# Patient Record
Sex: Female | Born: 1935 | Race: White | Hispanic: No | Smoking: Never smoker
Health system: Southern US, Community
[De-identification: ages and names within clinical notes are randomized; demographics above are authoritative.]

## PROBLEM LIST (undated history)

## (undated) DIAGNOSIS — I5022 Chronic systolic (congestive) heart failure: Secondary | ICD-10-CM

## (undated) DIAGNOSIS — I10 Essential (primary) hypertension: Secondary | ICD-10-CM

## (undated) DIAGNOSIS — I998 Other disorder of circulatory system: Secondary | ICD-10-CM

## (undated) DIAGNOSIS — G4733 Obstructive sleep apnea (adult) (pediatric): Secondary | ICD-10-CM

## (undated) DIAGNOSIS — K5790 Diverticulosis of intestine, part unspecified, without perforation or abscess without bleeding: Secondary | ICD-10-CM

## (undated) DIAGNOSIS — S42301A Unspecified fracture of shaft of humerus, right arm, initial encounter for closed fracture: Secondary | ICD-10-CM

## (undated) DIAGNOSIS — C8590 Non-Hodgkin lymphoma, unspecified, unspecified site: Secondary | ICD-10-CM

## (undated) DIAGNOSIS — I480 Paroxysmal atrial fibrillation: Secondary | ICD-10-CM

## (undated) DIAGNOSIS — J45909 Unspecified asthma, uncomplicated: Secondary | ICD-10-CM

## (undated) DIAGNOSIS — E079 Disorder of thyroid, unspecified: Secondary | ICD-10-CM

## (undated) DIAGNOSIS — S72002A Fracture of unspecified part of neck of left femur, initial encounter for closed fracture: Secondary | ICD-10-CM

## (undated) DIAGNOSIS — E039 Hypothyroidism, unspecified: Secondary | ICD-10-CM

## (undated) DIAGNOSIS — M1990 Unspecified osteoarthritis, unspecified site: Secondary | ICD-10-CM

## (undated) DIAGNOSIS — G7000 Myasthenia gravis without (acute) exacerbation: Secondary | ICD-10-CM

## (undated) HISTORY — PX: CHOLECYSTECTOMY: SHX55

## (undated) HISTORY — DX: Essential (primary) hypertension: I10

## (undated) HISTORY — PX: HERNIA REPAIR: SHX51

## (undated) HISTORY — PX: NM PET LYMPHOMA: HXRAD33

## (undated) HISTORY — PX: BACK SURGERY: SHX140

## (undated) HISTORY — DX: Disorder of thyroid, unspecified: E07.9

---

## 2004-01-31 ENCOUNTER — Ambulatory Visit: Payer: Self-pay | Admitting: Internal Medicine

## 2004-07-14 ENCOUNTER — Ambulatory Visit: Payer: Self-pay | Admitting: Internal Medicine

## 2004-07-21 ENCOUNTER — Ambulatory Visit: Payer: Self-pay | Admitting: Unknown Physician Specialty

## 2004-07-22 ENCOUNTER — Ambulatory Visit: Payer: Self-pay | Admitting: Unknown Physician Specialty

## 2004-07-30 ENCOUNTER — Ambulatory Visit: Payer: Self-pay | Admitting: Unknown Physician Specialty

## 2004-08-05 ENCOUNTER — Ambulatory Visit: Payer: Self-pay | Admitting: Internal Medicine

## 2004-08-13 ENCOUNTER — Ambulatory Visit: Payer: Self-pay | Admitting: Internal Medicine

## 2004-08-15 ENCOUNTER — Ambulatory Visit: Payer: Self-pay | Admitting: Internal Medicine

## 2004-08-25 ENCOUNTER — Ambulatory Visit: Payer: Self-pay | Admitting: Internal Medicine

## 2004-09-03 ENCOUNTER — Ambulatory Visit: Payer: Self-pay | Admitting: Otolaryngology

## 2004-09-25 ENCOUNTER — Ambulatory Visit: Payer: Self-pay | Admitting: Internal Medicine

## 2004-09-29 ENCOUNTER — Ambulatory Visit: Payer: Self-pay | Admitting: Surgery

## 2004-10-25 ENCOUNTER — Ambulatory Visit: Payer: Self-pay | Admitting: Internal Medicine

## 2004-10-30 ENCOUNTER — Ambulatory Visit: Payer: Self-pay | Admitting: Surgery

## 2004-11-25 ENCOUNTER — Ambulatory Visit: Payer: Self-pay | Admitting: Internal Medicine

## 2004-12-19 ENCOUNTER — Ambulatory Visit: Payer: Self-pay | Admitting: Internal Medicine

## 2004-12-26 ENCOUNTER — Ambulatory Visit: Payer: Self-pay | Admitting: Internal Medicine

## 2005-01-02 ENCOUNTER — Inpatient Hospital Stay: Payer: Self-pay | Admitting: Internal Medicine

## 2005-01-25 ENCOUNTER — Ambulatory Visit: Payer: Self-pay | Admitting: Internal Medicine

## 2005-01-29 ENCOUNTER — Inpatient Hospital Stay: Payer: Self-pay | Admitting: Internal Medicine

## 2005-01-30 ENCOUNTER — Ambulatory Visit: Payer: Self-pay | Admitting: Internal Medicine

## 2005-02-25 ENCOUNTER — Ambulatory Visit: Payer: Self-pay | Admitting: Internal Medicine

## 2005-03-27 ENCOUNTER — Ambulatory Visit: Payer: Self-pay | Admitting: Internal Medicine

## 2005-04-24 ENCOUNTER — Ambulatory Visit: Payer: Self-pay | Admitting: Internal Medicine

## 2005-04-27 ENCOUNTER — Ambulatory Visit: Payer: Self-pay | Admitting: Internal Medicine

## 2005-05-28 ENCOUNTER — Ambulatory Visit: Payer: Self-pay | Admitting: Internal Medicine

## 2005-06-25 ENCOUNTER — Ambulatory Visit: Payer: Self-pay | Admitting: Internal Medicine

## 2005-07-17 ENCOUNTER — Ambulatory Visit: Payer: Self-pay | Admitting: Internal Medicine

## 2005-07-26 ENCOUNTER — Ambulatory Visit: Payer: Self-pay | Admitting: Internal Medicine

## 2005-08-25 ENCOUNTER — Ambulatory Visit: Payer: Self-pay | Admitting: Internal Medicine

## 2005-09-02 ENCOUNTER — Emergency Department: Payer: Self-pay | Admitting: Emergency Medicine

## 2005-09-10 ENCOUNTER — Emergency Department: Payer: Self-pay | Admitting: Emergency Medicine

## 2005-09-11 ENCOUNTER — Encounter: Admission: RE | Admit: 2005-09-11 | Discharge: 2005-09-11 | Payer: Self-pay | Admitting: Internal Medicine

## 2005-09-30 ENCOUNTER — Ambulatory Visit: Payer: Self-pay | Admitting: Internal Medicine

## 2005-10-08 ENCOUNTER — Encounter: Admission: RE | Admit: 2005-10-08 | Discharge: 2005-10-08 | Payer: Self-pay | Admitting: Specialist

## 2005-10-22 ENCOUNTER — Encounter: Admission: RE | Admit: 2005-10-22 | Discharge: 2005-10-22 | Payer: Self-pay | Admitting: Specialist

## 2005-10-22 ENCOUNTER — Encounter (INDEPENDENT_AMBULATORY_CARE_PROVIDER_SITE_OTHER): Payer: Self-pay | Admitting: Specialist

## 2005-10-25 ENCOUNTER — Ambulatory Visit: Payer: Self-pay | Admitting: Internal Medicine

## 2005-11-06 ENCOUNTER — Ambulatory Visit: Payer: Self-pay | Admitting: Internal Medicine

## 2005-11-12 ENCOUNTER — Encounter: Admission: RE | Admit: 2005-11-12 | Discharge: 2005-11-12 | Payer: Self-pay | Admitting: Specialist

## 2005-11-25 ENCOUNTER — Ambulatory Visit: Payer: Self-pay | Admitting: Internal Medicine

## 2006-01-06 ENCOUNTER — Encounter: Admission: RE | Admit: 2006-01-06 | Discharge: 2006-01-06 | Payer: Self-pay | Admitting: Specialist

## 2006-02-05 ENCOUNTER — Ambulatory Visit: Payer: Self-pay | Admitting: Internal Medicine

## 2006-02-12 ENCOUNTER — Ambulatory Visit: Payer: Self-pay | Admitting: Internal Medicine

## 2006-02-25 ENCOUNTER — Ambulatory Visit: Payer: Self-pay | Admitting: Internal Medicine

## 2006-04-02 ENCOUNTER — Ambulatory Visit: Payer: Self-pay | Admitting: Internal Medicine

## 2006-04-12 ENCOUNTER — Encounter: Admission: RE | Admit: 2006-04-12 | Discharge: 2006-04-12 | Payer: Self-pay | Admitting: Specialist

## 2006-04-27 ENCOUNTER — Ambulatory Visit: Payer: Self-pay | Admitting: Internal Medicine

## 2006-06-01 ENCOUNTER — Ambulatory Visit: Payer: Self-pay | Admitting: Internal Medicine

## 2006-06-26 ENCOUNTER — Ambulatory Visit: Payer: Self-pay | Admitting: Internal Medicine

## 2006-07-06 ENCOUNTER — Ambulatory Visit: Payer: Self-pay | Admitting: Internal Medicine

## 2006-07-27 ENCOUNTER — Ambulatory Visit: Payer: Self-pay | Admitting: Internal Medicine

## 2006-08-17 ENCOUNTER — Ambulatory Visit: Payer: Self-pay | Admitting: Internal Medicine

## 2006-08-26 ENCOUNTER — Ambulatory Visit: Payer: Self-pay | Admitting: Internal Medicine

## 2006-09-08 ENCOUNTER — Ambulatory Visit (HOSPITAL_COMMUNITY): Admission: RE | Admit: 2006-09-08 | Discharge: 2006-09-10 | Payer: Self-pay | Admitting: Specialist

## 2006-09-26 ENCOUNTER — Ambulatory Visit: Payer: Self-pay | Admitting: Internal Medicine

## 2006-09-30 ENCOUNTER — Encounter: Payer: Self-pay | Admitting: Specialist

## 2006-10-07 ENCOUNTER — Ambulatory Visit: Payer: Self-pay | Admitting: Internal Medicine

## 2006-10-26 ENCOUNTER — Ambulatory Visit: Payer: Self-pay | Admitting: Internal Medicine

## 2006-12-14 ENCOUNTER — Ambulatory Visit: Payer: Self-pay | Admitting: Internal Medicine

## 2006-12-27 ENCOUNTER — Ambulatory Visit: Payer: Self-pay | Admitting: Internal Medicine

## 2007-01-26 ENCOUNTER — Ambulatory Visit: Payer: Self-pay | Admitting: Internal Medicine

## 2007-01-27 ENCOUNTER — Ambulatory Visit: Payer: Self-pay | Admitting: Internal Medicine

## 2007-02-04 ENCOUNTER — Encounter: Admission: RE | Admit: 2007-02-04 | Discharge: 2007-02-04 | Payer: Self-pay | Admitting: Specialist

## 2007-03-28 ENCOUNTER — Ambulatory Visit: Payer: Self-pay | Admitting: Internal Medicine

## 2007-03-29 ENCOUNTER — Ambulatory Visit: Payer: Self-pay | Admitting: Internal Medicine

## 2007-04-05 ENCOUNTER — Encounter: Admission: RE | Admit: 2007-04-05 | Discharge: 2007-04-05 | Payer: Self-pay | Admitting: Unknown Physician Specialty

## 2007-04-28 ENCOUNTER — Ambulatory Visit: Payer: Self-pay | Admitting: Internal Medicine

## 2007-05-09 ENCOUNTER — Encounter: Admission: RE | Admit: 2007-05-09 | Discharge: 2007-05-09 | Payer: Self-pay | Admitting: Specialist

## 2007-05-29 ENCOUNTER — Ambulatory Visit: Payer: Self-pay | Admitting: Internal Medicine

## 2007-05-31 ENCOUNTER — Ambulatory Visit: Payer: Self-pay | Admitting: Internal Medicine

## 2007-06-26 ENCOUNTER — Ambulatory Visit: Payer: Self-pay | Admitting: Internal Medicine

## 2007-07-21 ENCOUNTER — Ambulatory Visit: Payer: Self-pay | Admitting: Internal Medicine

## 2007-07-27 ENCOUNTER — Ambulatory Visit: Payer: Self-pay | Admitting: Internal Medicine

## 2007-08-26 ENCOUNTER — Ambulatory Visit: Payer: Self-pay | Admitting: Internal Medicine

## 2007-10-26 ENCOUNTER — Ambulatory Visit: Payer: Self-pay | Admitting: Internal Medicine

## 2007-11-26 ENCOUNTER — Ambulatory Visit: Payer: Self-pay | Admitting: Internal Medicine

## 2008-01-26 ENCOUNTER — Ambulatory Visit: Payer: Self-pay | Admitting: Internal Medicine

## 2008-02-13 ENCOUNTER — Ambulatory Visit: Payer: Self-pay | Admitting: Internal Medicine

## 2008-02-16 ENCOUNTER — Ambulatory Visit: Payer: Self-pay | Admitting: Internal Medicine

## 2008-02-26 ENCOUNTER — Ambulatory Visit: Payer: Self-pay | Admitting: Internal Medicine

## 2008-03-27 ENCOUNTER — Ambulatory Visit: Payer: Self-pay | Admitting: Internal Medicine

## 2008-04-10 ENCOUNTER — Ambulatory Visit: Payer: Self-pay | Admitting: Internal Medicine

## 2008-04-27 ENCOUNTER — Ambulatory Visit: Payer: Self-pay | Admitting: Internal Medicine

## 2008-05-28 ENCOUNTER — Ambulatory Visit: Payer: Self-pay | Admitting: Internal Medicine

## 2008-06-05 ENCOUNTER — Ambulatory Visit: Payer: Self-pay | Admitting: Internal Medicine

## 2008-06-25 ENCOUNTER — Ambulatory Visit: Payer: Self-pay | Admitting: Internal Medicine

## 2008-07-26 ENCOUNTER — Ambulatory Visit: Payer: Self-pay | Admitting: Internal Medicine

## 2008-08-25 ENCOUNTER — Ambulatory Visit: Payer: Self-pay | Admitting: Internal Medicine

## 2008-09-06 ENCOUNTER — Ambulatory Visit: Payer: Self-pay | Admitting: Internal Medicine

## 2008-09-25 ENCOUNTER — Ambulatory Visit: Payer: Self-pay | Admitting: Internal Medicine

## 2008-10-25 ENCOUNTER — Ambulatory Visit: Payer: Self-pay | Admitting: Internal Medicine

## 2008-11-02 ENCOUNTER — Ambulatory Visit: Payer: Self-pay | Admitting: Internal Medicine

## 2008-11-25 ENCOUNTER — Ambulatory Visit: Payer: Self-pay | Admitting: Internal Medicine

## 2008-12-11 ENCOUNTER — Ambulatory Visit: Payer: Self-pay | Admitting: Internal Medicine

## 2008-12-12 ENCOUNTER — Ambulatory Visit: Payer: Self-pay | Admitting: Internal Medicine

## 2008-12-26 ENCOUNTER — Ambulatory Visit: Payer: Self-pay | Admitting: Internal Medicine

## 2009-02-25 ENCOUNTER — Ambulatory Visit: Payer: Self-pay | Admitting: Internal Medicine

## 2009-03-14 ENCOUNTER — Ambulatory Visit: Payer: Self-pay | Admitting: Internal Medicine

## 2009-03-27 ENCOUNTER — Ambulatory Visit: Payer: Self-pay | Admitting: Internal Medicine

## 2009-06-25 ENCOUNTER — Ambulatory Visit: Payer: Self-pay | Admitting: Internal Medicine

## 2009-07-11 ENCOUNTER — Ambulatory Visit: Payer: Self-pay | Admitting: Internal Medicine

## 2009-07-26 ENCOUNTER — Ambulatory Visit: Payer: Self-pay | Admitting: Internal Medicine

## 2009-10-25 ENCOUNTER — Ambulatory Visit: Payer: Self-pay | Admitting: Internal Medicine

## 2009-11-11 ENCOUNTER — Ambulatory Visit: Payer: Self-pay | Admitting: Internal Medicine

## 2009-11-25 ENCOUNTER — Ambulatory Visit: Payer: Self-pay | Admitting: Internal Medicine

## 2010-03-17 ENCOUNTER — Ambulatory Visit: Payer: Self-pay | Admitting: Internal Medicine

## 2010-03-27 ENCOUNTER — Ambulatory Visit: Payer: Self-pay | Admitting: Internal Medicine

## 2010-05-18 ENCOUNTER — Encounter: Payer: Self-pay | Admitting: Specialist

## 2010-07-28 ENCOUNTER — Ambulatory Visit: Payer: Self-pay | Admitting: Internal Medicine

## 2010-08-26 ENCOUNTER — Ambulatory Visit: Payer: Self-pay | Admitting: Internal Medicine

## 2010-09-12 NOTE — Op Note (Signed)
NAMESIARA, GORDER              ACCOUNT NO.:  [id number]   MEDICAL RECORD NO.:  [id number]          PATIENT TYPE:  AMB   LOCATION:  DAY                          FACILITY:  [facility]   PHYSICIAN:  Jene Every, M.D.    DATE OF BIRTH:  05-08-1929   DATE OF PROCEDURE:  09/08/2006  DATE OF DISCHARGE:                               OPERATIVE REPORT   PREOPERATIVE DIAGNOSES:  1. Spinal stenosis, L4-5.  2. Grade 1-1/2 spondylolisthesis.   POSTOPERATIVE DIAGNOSES:  1. Spinal stenosis, L4-5.  2. Grade 1-1/2 spondylolisthesis.   PROCEDURES PERFORMED:  Lumbar decompression after attempted X-Stop  insertion at L4-5 by central decompression of L4-5, bilateral  foraminotomies, lateral recess decompression.   ANESTHESIA:  General.   ASSISTANT:  Georges Lynch. Gioffre, M.D.   BRIEF HISTORY AND INDICATION:  A 77 year old with a history of scoliosis  and spinal stenosis with disabling neurogenic claudication.  She had  relief of her symptoms when she sat and was able to ambulate 50 feet.  Had a history of a spinal compression fracture, though that was  traumatic and a bone density study was satisfactory, and therefore we  felt that she was a candidate for an X-Stop insertion due to her overall  size and the advantage of having a support in at L4-5 that limited her  extension.  She did have facet arthropathy at multiple levels, so this  was designed to reduce her leg pain.  The risks and benefits of the  procedure were discussed, including bleeding, infection, damage to  vascular structures, CSF leakage, arthrofibrosis, adjacent segment  disease, inability to insert the X-Stop, need for X-Stop removal, and  decompression in the future and/or fusion.   TECHNIQUE:  With the patient in supine position after the induction of  adequate general anesthesia and 2 g Kefzol, she was placed prone on the  Wilson frame and all bony prominences well-padded.  The lumbar region  was prepped and draped in the  usual sterile fashion.  An 18-gauge spinal  needle was utilized to localize the L4-5 interspace, confirmed with x-  ray.  She was fairly obese and x-ray was somewhat limited.  The  subcutaneous tissue was dissected.  Electrocautery was utilized to  achieve hemostasis.  The thoracolumbar fascia identified and divided on  either line of the interspinous ligament at L4-5, preserving the  interspinous ligament.  The paraspinous muscle elevated from the lamina  of L4 and L5.  Removed a portion of the hypertrophic facet.  I then  under C-arm x-ray used the first dilator of the X-Stop and this dilated  in the appropriate position anteriorly and between the fourth and fifth  spinous process.  I then inserted the graduated dilator following that  without difficulty and dilated it to what I thought was satisfactory at  10 with two fingers but could not insert it to a 12.  We let that sit  for a period of time for relaxation and then removed it.  We therefore  selected the 10 X-Stop.  I then under direct visualization gently  inserted from right to left the bulleted  X-Stop.  As we were advancing  the X-Stop from right to left, however, there was what appeared to be a  traction line through the spinous process of L5.  I then removed the X-  Stop and evaluated.  That was indeed in fact.  I felt perhaps it was  secondary to the extensive shingling of the spinous processes.  I  attempted to repair that with FiberWire; however, after reinserting the  X-Stop it was clear that this was not going to stay in place and it  would not provide her the relief of her neurogenic claudication that the  procedure was designed for.  I therefore felt that proceeding with a  decompression was our next best option.  I consulted with Dr. Darrelyn Hillock  and asked for his assistance, and he concurred with the spinal stenosis  in this procedure with the spinous process incompetent at this point in  time, that this would require the  best would be to proceed with a  decompression.  I called the family and her daughter and sister had  discussed the issue and I spoke with them, indicating that we could  either just discontinue the procedure or proceed with a decompression,  which I had discussed with them in the office that would be required if  just the X-Stop did not work in any event.  I indicated at this point  the rehab would be about the same and I felt it was appropriate to do so  given the degree and the severity of the spinal stenosis.  She gave her  consent and therefore proceeded with that.  The operating microscope had  been draped and brought into the surgical field and the wound was  copiously irrigated.  I removed the spinous process of most of L4 and L5  and the interspinous ligament.  Then I used the 2 mm Kerrison to perform  a hemilaminotomy of the caudad edge of L4 and the cephalad edge of L5  with the 2 and then the 3 mm Kerrison, undercutting the facets to the  medial border of the pedicle bilaterally.  Pledgets were not used  initially.  Extremely hypertrophic ligamentum flavum was noted and this  was removed, decompressing the thecal sac, again undercutting the facets  with the neural elements well-protected.  We extended the decompression  cephalad and caudad and had an excellent expansion of the thecal sac.  We confirmed x-ray prior to this to confirm the level.  I freely placed  a hockey stick probe in the foramen of L4 and L5 after foraminotomies,  and they were widely patent.  The disk was examined and there was no  evidence of disk rupture.  Bipolar electrocautery was utilized to  achieve hemostasis. The wound was copiously irrigated.  We used FloSeal  in the wound for hemostasis and bone was on the cancellous surfaces.  There was no evidence of CSF leakage or active bleeding.  I placed a  drain in the wound and brought it out through a lateral stab wound in the skin.  I then repaired the  dorsal lumbar fascia with #1 Vicryl  interrupted figure-of-eight sutures, subcutaneous tissue reapproximated  in layers with 2-0 Vicryl suture and the skin was reapproximated with  staples.  The drain was checked and it slid without difficulty.  A  sterile dressing applied.  She was placed supine on a hospital bed,  extubated without difficulty, and transported to the recovery room in  satisfactory condition.  The patient tolerated the procedure well with no complications.  Blood  loss 100 mL.      Jene Every, M.D.  Electronically Signed     JB/MEDQ  D:  09/08/2006  T:  09/08/2006  Job:  [id number]

## 2010-09-23 ENCOUNTER — Ambulatory Visit: Payer: Self-pay | Admitting: General Practice

## 2010-09-26 ENCOUNTER — Ambulatory Visit: Payer: Self-pay | Admitting: Internal Medicine

## 2011-03-09 ENCOUNTER — Ambulatory Visit: Payer: Self-pay | Admitting: Internal Medicine

## 2011-03-28 ENCOUNTER — Ambulatory Visit: Payer: Self-pay | Admitting: Internal Medicine

## 2011-07-08 ENCOUNTER — Ambulatory Visit: Payer: Self-pay | Admitting: Internal Medicine

## 2011-07-08 LAB — LIPID PANEL
Cholesterol: 254 mg/dL — ABNORMAL HIGH (ref 0–200)
HDL Cholesterol: 32 mg/dL — ABNORMAL LOW (ref 40–60)
Ldl Cholesterol, Calc: 174 mg/dL — ABNORMAL HIGH (ref 0–100)
Triglycerides: 242 mg/dL — ABNORMAL HIGH (ref 0–200)
VLDL Cholesterol, Calc: 48 mg/dL — ABNORMAL HIGH (ref 5–40)

## 2011-07-08 LAB — CBC CANCER CENTER
Basophil %: 0.9 %
Eosinophil %: 1.6 %
HCT: 39.6 % (ref 35.0–47.0)
Lymphocyte #: 1.3 x10 3/mm (ref 1.0–3.6)
Lymphocyte %: 17.8 %
MCV: 86 fL (ref 80–100)
Monocyte %: 6.9 %
Neutrophil #: 5.4 x10 3/mm (ref 1.4–6.5)
Platelet: 230 x10 3/mm (ref 150–440)

## 2011-07-08 LAB — CREATININE, SERUM
Creatinine: 1.29 mg/dL (ref 0.60–1.30)
EGFR (African American): 51 — ABNORMAL LOW

## 2011-07-08 LAB — URIC ACID: Uric Acid: 6.9 mg/dL — ABNORMAL HIGH (ref 2.6–6.0)

## 2011-07-08 LAB — TSH: Thyroid Stimulating Horm: 2.79 u[IU]/mL

## 2011-07-08 LAB — LACTATE DEHYDROGENASE: LDH: 177 U/L (ref 84–246)

## 2011-07-27 ENCOUNTER — Ambulatory Visit: Payer: Self-pay | Admitting: Internal Medicine

## 2012-11-29 ENCOUNTER — Ambulatory Visit: Payer: Self-pay | Admitting: Internal Medicine

## 2012-11-29 LAB — COMPREHENSIVE METABOLIC PANEL
Albumin: 2.8 g/dL — ABNORMAL LOW (ref 3.4–5.0)
Co2: 32 mmol/L (ref 21–32)
EGFR (African American): 40 — ABNORMAL LOW
EGFR (Non-African Amer.): 35 — ABNORMAL LOW
Potassium: 4.2 mmol/L (ref 3.5–5.1)
SGOT(AST): 22 U/L (ref 15–37)
SGPT (ALT): 18 U/L (ref 12–78)
Sodium: 137 mmol/L (ref 136–145)

## 2012-11-29 LAB — HEMOGLOBIN A1C: Hemoglobin A1C: 6 % (ref 4.2–6.3)

## 2012-11-29 LAB — CBC WITH DIFFERENTIAL/PLATELET
Basophil #: 0.1 10*3/uL (ref 0.0–0.1)
Eosinophil #: 0.2 10*3/uL (ref 0.0–0.7)
HGB: 13.5 g/dL (ref 12.0–16.0)
Lymphocyte #: 2.1 10*3/uL (ref 1.0–3.6)
Lymphocyte %: 18.5 %
MCH: 29.6 pg (ref 26.0–34.0)
MCHC: 33.6 g/dL (ref 32.0–36.0)
MCV: 88 fL (ref 80–100)
Neutrophil #: 7.9 10*3/uL — ABNORMAL HIGH (ref 1.4–6.5)
RDW: 14.6 % — ABNORMAL HIGH (ref 11.5–14.5)
WBC: 11.1 10*3/uL — ABNORMAL HIGH (ref 3.6–11.0)

## 2012-11-29 LAB — CREATININE, SERUM
EGFR (African American): 41 — ABNORMAL LOW
EGFR (Non-African Amer.): 35 — ABNORMAL LOW

## 2012-11-29 LAB — TSH: Thyroid Stimulating Horm: 2.38 u[IU]/mL

## 2012-12-07 ENCOUNTER — Ambulatory Visit (INDEPENDENT_AMBULATORY_CARE_PROVIDER_SITE_OTHER): Payer: PRIVATE HEALTH INSURANCE | Admitting: Neurology

## 2012-12-07 ENCOUNTER — Encounter: Payer: Self-pay | Admitting: Neurology

## 2012-12-07 VITALS — BP 125/71 | HR 69 | Ht 59.0 in | Wt 220.2 lb

## 2012-12-07 DIAGNOSIS — H532 Diplopia: Secondary | ICD-10-CM

## 2012-12-07 NOTE — Patient Instructions (Addendum)
Overall you are doing fairly well but I do want to suggest a few things today:   Remember to drink plenty of fluid, eat healthy meals and do not skip any meals. Try to eat protein with a every meal and eat a healthy snack such as fruit or nuts in between meals. Try to keep a regular sleep-wake schedule and try to exercise daily, particularly in the form of walking, 20-30 minutes a day, if you can.   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 This a weakness in one of the muscles that control your eye movements. The good news is this typically will resolve on its own.   As far as your medications are concerned, I would like to suggest you start a bay 81mg  aspirin. Take one tablet daily for stroke prevention.  I would like to see you back in 4 months, sooner if we need to. Please call us with any interim questions, concerns, problems, updates or refill requests.   Please also call us for any test results so we can go over those with you on the phone.  My clinical assistant and will answer any of your questions and relay your messages to me and also relay most of my messages to you.   Our phone number is [phone]. We also have an after hours call service for urgent matters and there is a physician on-call for urgent questions. For any emergencies you know to call 911 or go to the nearest emergency room

## 2012-12-07 NOTE — Progress Notes (Addendum)
[facility]  Provider:  Dr Hosie Poisson Referring Provider: No ref. provider found Primary Care Physician:  No primary provider on file.  CC:  Double vision  HPI:  [name] is a 79 y.o. female here as a referral from Dr. Park Breed for evaluation of diplopia.  She reports the symptoms began acutely around one week ago. Has been near continuous since then. She was driving a car when initially started, she noted double yellow line lines, they were horizontal to each other. Continues to see horizontal double images, though on occasion he feels it is vertical. She is unsure if it is worse or better with in one direction. It improves with covering of one eye. There is no eye pain. No facial weakness, no numbness no extremity weakness or sensory changes. No tinnitus or abnormal sounds in her ear. No ear pain. Has baseline decreased hearing in her right ear. Has had cataract surgery in her left eye. She saw an optometrist after the onset of the symptoms, he noted normal eye exam as such as she followup with a neurologist. She also saw her primary care physician who ordered an MRI which was reported to be normal, and instructed to followup with a neurologist. Denies any prior history of the symptoms. No history of diabetes. No thyroid disorder. She does have a history of lymphoma in the past which is followed by her oncologist.   A few weeks ago she did notice some drooping in her left old lower eyelid, and this has been continuous. She was told it was a ptosis due to weakening of the eye muscles.   Review of Systems: Out of a complete 14 system review, the patient complains of only the following symptoms, and all other reviewed systems are negative. Positive for weight loss fatigue double vision easy bruising shortness of breath hearing loss incontinence aching muscles joint pain when he notes weakness with numbness History   Social History  . Marital Status: Widowed    Spouse Name: N/A     Number of Children: 4  . 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 Smoking status: Never Smoker   . Smokeless tobacco: Never Used  . Alcohol Use: No  . Drug Use: No  . Sexual Activity: Not on file   Other Topics Concern  . Not on file   Social History Narrative   Patient lives at home with son.     No family history on file.  Past Medical History  Diagnosis Date  . Hypertension   . Thyroid disorder     Past Surgical History  Procedure Laterality Date  . Nm pet lymphoma      Current Outpatient Prescriptions  Medication Sig Dispense Refill  . atenolol (TENORMIN) 25 MG tablet Take 25 mg by mouth daily.       . benazepril-hydrochlorthiazide (LOTENSIN HCT) 20-12.5 MG per tablet Take 1 tablet by mouth daily.       . clotrimazole-betamethasone (LOTRISONE) cream as needed.       Marland Kitchen levothyroxine (SYNTHROID, LEVOTHROID) 75 MCG tablet Take 75 mcg by mouth daily.        No current facility-administered medications for this visit.    Allergies as of 12/07/2012 - Review Complete 12/07/2012  Allergen Reaction Noted  . Sulfa antibiotics  12/07/2012    Vitals: BP 125/71  Pulse 69  Ht 4\' 11"  (1.499 m)  Wt 220 lb  4 oz (99.905 kg)  BMI 44.46 kg/m2 Last Weight:  Wt Readings from Last 1 Encounters:  12/07/12 220 lb 4 oz (99.905 kg)   Last Height:   Ht Readings from Last 1 Encounters:  12/07/12 4\' 11"  (1.499 m)     Physical exam: Exam: Gen: NAD, conversant, obese Eyes: anicteric sclerae, moist conjunctivae HENT: Atraumati Neck: Trachea midline; supple,  Lungs: CTA, no wheezing, rales, rhonic                          CV: RRR, no MRG Abdomen: Soft, non-tender;  Extremities: No peripheral edema  Skin: Normal temperature, no rash,  Psych: Appropriate affect, pleasant  Neuro: MS: AA&Ox3, appropriately interactive, normal affect   Speech: fluent w/o paraphasic error  Memory: good recent and remote  recall  CN: Anisocoria with larger right pupil (reports chronic), minimally impaired lateral gaze of R eye with double vision noted with lateral right gaze, no nystagmus, ptosis of left lid noted, sensation intact to LT and vibration V1-V3 bilat, face symmetric, no weakness, hearing diminisned on the right, palate elevates symmetrically, shoulder shrug 5/5 bilat,  tongue protrudes midline, no fasiculations noted.  Motor: normal bulk and tone, decreased ROM R shoulder which patient attributes to chronic rotator cuff injury Strength: Weakness in RUE attributed to rotator cuff injury, proximal bilateral lower extremity weakness which has been a chronic issue  Coord: rapid alternating and point-to-point (FNF, HTS) movements intact.  Reflexes: symmetrical, bilat downgoing toes  Sens: LT intact in all extremities  Gait: posture, stance, stride and arm-swing normal. Tandem gait intact. Able to walk on heels and toes. Romberg absent.   Assessment:  After physical and neurologic examination, review of laboratory studies, imaging, neurophysiology testing and pre-existing records, assessment will be reviewed on the problem list.  Plan:  Treatment plan and additional workup will be reviewed under Problem List.  Ms. Hankinson is a pleasant 79 year old woman who presents for initial evaluation of binocular diplopia. This was acute onset around one week ago. It is painless and improves with covering one eye. On physical exam she was noted to have impaired lateral gaze of the right eye with worsening of double vision with lateral gaze to the right. Otherwise her physical exam was unremarkable. 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 Differential would also include thyroid eye disease though this is less likely based on lack of supporting clinical  features. Will check thyroid levels will. Based on history lymphoma would also have to consider carcinomatous meningitis though this is very unlikely based on the isolated symptoms and otherwise unremarkable physical exam.  1)Diplopia: appears secondary to isolated 6th nerve palsy -Patient and daughter counseled that this is most likely an isolated event, and which will improve slowly over time. -Discussed patient start aspirin 81 mg daily -Discussed getting lab work with patient, patient wishes to hold off and have lab work done when she follows up with her oncologist next week. Will request hemoglobin A1c, lipid panel and TSH, free T4 -No further imaging warranted at this time -Patient counseled that if it does not improve she may benefit from the use of prism eyeglasses, can followup with ophthalmologist as needed -Followup with neurology in 4 months or as needed  I have read the note, and I agree with the clinical assessment and  plan.  Lesly Dukes

## 2012-12-13 ENCOUNTER — Ambulatory Visit: Payer: Self-pay | Admitting: Internal Medicine

## 2012-12-26 ENCOUNTER — Ambulatory Visit: Payer: Self-pay | Admitting: Internal Medicine

## 2013-03-02 ENCOUNTER — Other Ambulatory Visit: Payer: Self-pay

## 2013-03-31 ENCOUNTER — Other Ambulatory Visit: Payer: Self-pay | Admitting: Neurology

## 2013-03-31 ENCOUNTER — Encounter: Payer: Self-pay | Admitting: Neurology

## 2013-03-31 ENCOUNTER — Ambulatory Visit (INDEPENDENT_AMBULATORY_CARE_PROVIDER_SITE_OTHER): Payer: PRIVATE HEALTH INSURANCE | Admitting: Neurology

## 2013-03-31 VITALS — BP 132/80 | HR 72 | Wt 219.0 lb

## 2013-03-31 DIAGNOSIS — H532 Diplopia: Secondary | ICD-10-CM

## 2013-03-31 NOTE — Progress Notes (Signed)
[facility]  Provider:  Dr Hosie Poisson Referring Provider: No ref. provider found Primary Care Physician:  No primary provider on file.  CC:  Double vision  HPI:  [name] is a 79 y.o. female here as a followup from Dr. Park Breed for evaluation of diplopia.  Returns today for followup visit. She continues have difficulty with double vision, though notes it is slowly and progressively improving. Continues to be a horizontal double vision when looking to the right. Resolves with covering one eye. No eye pain no headache no focal weakness or sensory changes. She does continue to have some ptosis of the right eye this has been continuous.  She does note fatigue throughout the day, difficulty sleeping, waking up frequently throughout the night. She does snore, unsure if any apnea events. She does have a history of sleep apnea, has been told to wear a CPAP in the past, is not currently wearing it. Unsure when she last followed up with a sleep doctor.    Initial visit 11/2102: She reports the symptoms began acutely around one week ago. Has been near continuous since then. She was driving a car when initially started, she noted double yellow line lines, they were horizontal to each other. Continues to see horizontal double images, though on occasion he feels it is vertical. She is unsure if it is worse or better with in one direction. It improves with covering of one eye. There is no eye pain. No facial weakness, no numbness no extremity weakness or sensory changes. No tinnitus or abnormal sounds in her ear. No ear pain. Has baseline decreased hearing in her right ear. Has had cataract surgery in her left eye. She saw an optometrist after the onset of the symptoms, he noted normal eye exam as such as she followup with a neurologist. She also saw her primary care physician who ordered an MRI which was reported to be normal, and instructed to followup with a neurologist. Denies any prior  history of the symptoms. No history of diabetes. No thyroid disorder. She does have a history of lymphoma in the past which is followed by her oncologist.   A few weeks ago she did notice some drooping in her left old lower eyelid, and this has been continuous. She was told it was a ptosis due to weakening of the eye muscles.   Review of Systems: Out of a complete 14 system review, the patient complains of only the following symptoms, and all other reviewed systems are negative. Positive eye redness light sensitivity double vision back pain walking difficulty daytime sleepiness  History   Social History  . Marital Status: Widowed    Spouse Name: N/A    Number of Children: 4  . 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 Smoking status: Never Smoker   . Smokeless tobacco: Never Used  . Alcohol Use: No  . Drug Use: No  . Sexual Activity: Not on file   Other Topics Concern  . Not on file   Social History Narrative   Patient lives at home with son.   Patient has four children.   Patient is retired.   Patient has a high school education.   Patient is right-handed.   Patient drinks very little caffeine.     No family history on file.  Past Medical History  Diagnosis Date  . Hypertension   . Thyroid disorder  Past Surgical History  Procedure Laterality Date  . Nm pet lymphoma      Current Outpatient Prescriptions  Medication Sig Dispense Refill  . aspirin EC 81 MG tablet Take 81 mg by mouth daily.      Marland Kitchen atenolol (TENORMIN) 25 MG tablet Take 25 mg by mouth daily.       . benazepril-hydrochlorthiazide (LOTENSIN HCT) 20-12.5 MG per tablet Take 1 tablet by mouth daily.       . clotrimazole-betamethasone (LOTRISONE) cream as needed.       Marland Kitchen levothyroxine (SYNTHROID, LEVOTHROID) 75 MCG tablet Take 75 mcg by mouth daily.        No current facility-administered medications for this visit.    Allergies as of  03/31/2013 - Review Complete 03/31/2013  Allergen Reaction Noted  . Sulfa antibiotics  12/07/2012    Vitals: BP 132/80  Pulse 72  Wt 219 lb (99.338 kg) Last Weight:  Wt Readings from Last 1 Encounters:  03/31/13 219 lb (99.338 kg)   Last Height:   Ht Readings from Last 1 Encounters:  12/07/12 4\' 11"  (1.499 m)     Physical exam: Exam: Gen: NAD, conversant, obese Eyes: anicteric sclerae, moist conjunctivae HENT: Atraumati Neck: Trachea midline; supple,  Lungs: CTA, no wheezing, rales, rhonic                          CV: RRR, no MRG Abdomen: Soft, non-tender;  Extremities: No peripheral edema  Skin: Normal temperature, no rash,  Psych: Appropriate affect, pleasant  Neuro: MS: AA&Ox3, appropriately interactive, normal affect   Speech: fluent w/o paraphasic error  Memory: good recent and remote recall  CN: PERRL, minimally impaired lateral gaze of R eye with double vision noted with lateral right gaze, no nystagmus, ptosis of right  lid noted, sensation intact to LT and vibration V1-V3 bilat, face symmetric, no weakness, hearing diminisned on the right, palate elevates symmetrically, shoulder shrug 5/5 bilat,  tongue protrudes midline, no fasiculations noted.  Motor: normal bulk and tone, decreased ROM R shoulder which patient attributes to chronic rotator cuff injury Strength: Weakness in RUE attributed to rotator cuff injury, proximal bilateral lower extremity weakness which has been a chronic issue  Coord: rapid alternating and point-to-point (FNF, HTS) movements intact.  Reflexes: symmetrical, bilat downgoing toes  Sens: LT intact in all extremities  Gait: posture, stance, stride and arm-swing normal. Tandem gait intact. Able to walk on heels and toes. Romberg absent.   Assessment:  After physical and neurologic examination, review of laboratory studies, imaging, neurophysiology testing and pre-existing records, assessment will be reviewed on the problem  list.  Plan:  Treatment plan and additional workup will be reviewed under Problem List.  1)Diplopia  Ms. Peavler is a pleasant 79 year old woman who presents for follow up evaluation of binocular diplopia. It is painless and improves with covering one eye. On physical exam she was noted to have mildly impaired lateral gaze of the right eye with worsening of double vision with lateral gaze to the right. Also note some ptosis of R eye.Otherwise her physical exam was unremarkable. 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 Differential would also include thyroid eye disease though this is less likely based on lack of supporting clinical features. Will check thyroid levels  Based on history lymphoma would also  have to consider carcinomatous meningitis though this is very unlikely based on the isolated symptoms and otherwise unremarkable physical exam. Will have patient follow up with neuro- ophthalmology for further evaluation and possible prism lenses

## 2013-03-31 NOTE — Patient Instructions (Signed)
Overall you are doing fairly well but I do want to suggest a few things today:   We will refer you to the eye doctor for evaluation for possible prism lenses to help treat your double vision  We would like to check some blood work today  I suggest you follow up with your sleep doctor for treatment of your sleep apnea.   Please also call us for any test results so we can go over those with you on the phone.  My clinical assistant and will answer any of your questions and relay your messages to me and also relay most of my messages to you.   Our phone number is [phone]. We also have an after hours call service for urgent matters and there is a physician on-call for urgent questions. For any emergencies you know to call 911 or go to the nearest emergency room

## 2013-04-04 LAB — ACETYLCHOLINE RECEPTOR, BINDING: AChR Binding Ab, Serum: 7.4 nmol/L — ABNORMAL HIGH (ref 0.00–0.24)

## 2013-04-07 ENCOUNTER — Ambulatory Visit: Payer: PRIVATE HEALTH INSURANCE | Admitting: Neurology

## 2013-04-12 ENCOUNTER — Telehealth: Payer: Self-pay | Admitting: Neurology

## 2013-04-12 ENCOUNTER — Other Ambulatory Visit: Payer: Self-pay | Admitting: Neurology

## 2013-04-12 DIAGNOSIS — G7000 Myasthenia gravis without (acute) exacerbation: Secondary | ICD-10-CM

## 2013-04-12 MED ORDER — PYRIDOSTIGMINE BROMIDE 60 MG PO TABS: 30.0000 mg | ORAL_TABLET | Freq: Three times a day (TID) | ORAL | Status: DC

## 2013-04-12 NOTE — Telephone Encounter (Signed)
RETURNING CALL  °

## 2013-04-12 NOTE — Telephone Encounter (Signed)
Daughter would like to know if you could call her with the results because it is hard to get her mother out the house. If she has to bring her she will but rather not. Please advise. Thanks

## 2013-04-12 NOTE — Telephone Encounter (Signed)
Daughter called and findings discussed. Counseled her that lab result and clinical symptoms are consistent with a diagnosis of myasthenia gravis. Will start patient on Mestinon 30mg  TID and order CT chest to rule out thymoma.

## 2013-04-12 NOTE — Telephone Encounter (Signed)
Message copied by Ardeth Sportsman on Wed Apr 12, 2013 11:42 AM ------      Message from: Ramond Marrow      Created: Mon Apr 10, 2013 11:38 AM       Please schedule a follow up with her to discuss abnormal lab results. Thanks. ------

## 2013-05-01 ENCOUNTER — Telehealth: Payer: Self-pay | Admitting: Neurology

## 2013-05-01 NOTE — Telephone Encounter (Signed)
Patient's daughter called to state that no one has contacted her about patient's CT scan that was ordered on 04/12/13. Please call patient's daughter.

## 2013-05-01 NOTE — Telephone Encounter (Signed)
Daughter is requesting CT be done at an office in [place]

## 2013-05-08 ENCOUNTER — Telehealth: Payer: Self-pay | Admitting: Neurology

## 2013-05-08 NOTE — Telephone Encounter (Signed)
Patient's daughter calling because she has not heard back about scheduling CT scan yet. Please call the patient's daughter back.

## 2013-05-25 ENCOUNTER — Telehealth: Payer: Self-pay | Admitting: Neurology

## 2013-05-25 NOTE — Telephone Encounter (Signed)
Patient is wanting to know if there is another medication that she could try as she is retaining fluid. Patient is also having trouble sleeping. Please call to advise  - [phone] daughter's cell

## 2013-05-25 NOTE — Telephone Encounter (Signed)
Called patients daughter back to discuss concerns over medication. Counseled her that Mestinon does not typically cause edema/fluid retention. States the patient wishes to try a lower dose, therefore will decrease to 1x a day. She is still awaiting the chest CT. Will follow up as needed.

## 2013-05-29 NOTE — Addendum Note (Signed)
Addended byOliver Hum on: 05/29/2013 11:36 AM   Modules accepted: Orders

## 2013-06-09 ENCOUNTER — Ambulatory Visit: Payer: Self-pay | Admitting: Neurology

## 2013-06-15 ENCOUNTER — Other Ambulatory Visit: Payer: Self-pay | Admitting: Neurology

## 2013-06-15 ENCOUNTER — Telehealth: Payer: Self-pay | Admitting: Neurology

## 2013-06-15 NOTE — Telephone Encounter (Signed)
Patient's daughter calling to say she spoke with medical records dept at [facility] and they stated they faxed [place] on Friday at 12:3o pm. If [facility] did not receive the fax, someone from [place] needs to call [place].

## 2013-06-15 NOTE — Telephone Encounter (Signed)
Please call [name] and have the CT chest results refaxed over because I do not have them. Thanks.

## 2013-06-15 NOTE — Telephone Encounter (Signed)
Patient's daughter calling to request patient's CT scan results. Please call her back.

## 2013-06-15 NOTE — Telephone Encounter (Signed)
Patient daughter is requesting patient CT scan results that the patient had. [facility] told the patient's daughter that the information was faxed on Friday at 12:30 pm. Please advise.

## 2013-06-15 NOTE — Telephone Encounter (Signed)
Called patient and spoke with patient's daughter [name] concerning patient CT scan that she had done. I explained to the daughter that Dr. Janann Colonel has not received the results yet. I advised the patient's daughter that if the patient has any other problems, questions or concerns to call the office. Patient's daughter verbalized understanding.

## 2013-06-16 NOTE — Telephone Encounter (Signed)
Called and discussed results with patient's daughter. Notified her that there was no evidence of a thymoma but it did show a new left lower lobe nodule. Counseled her to follow up with primary care doctor for further evaluation. She expressed understanding.

## 2013-06-16 NOTE — Telephone Encounter (Signed)
Called [facility] to request patient's CT results, received the results around 1 pm, once reviewed by Dr. Janann Colonel patient will be contacted.

## 2013-08-25 ENCOUNTER — Encounter: Payer: Self-pay | Admitting: Neurology

## 2013-08-25 ENCOUNTER — Ambulatory Visit (INDEPENDENT_AMBULATORY_CARE_PROVIDER_SITE_OTHER): Payer: Medicare Other | Admitting: Neurology

## 2013-08-25 VITALS — BP 118/60 | HR 59

## 2013-08-25 DIAGNOSIS — G7000 Myasthenia gravis without (acute) exacerbation: Secondary | ICD-10-CM

## 2013-08-25 MED ORDER — PYRIDOSTIGMINE BROMIDE 60 MG PO TABS: 30.0000 mg | ORAL_TABLET | Freq: Two times a day (BID) | ORAL | Status: DC

## 2013-08-25 NOTE — Progress Notes (Signed)
[facility]  Provider:  Dr Janann Colonel Referring Provider: No ref. provider found Primary Care Physician:  Lavera Guise, MD  CC:  Double vision  HPI:  [name] is a 80 y.o. female here as a followup from Dr. Chancy Milroy for evaluation of diplopia with a diagnosis of myasthenia gravis. At last visit she was started on Mestinon 30mg  TID, was unable to tolerate so she decreased to 30mg  BID. Tolerating this dose well and noted improvement/resoluation of her double vision. No acute concerns. Does note some mild ptosis as the day goes on but is not interfering with her quality of life.  Her daughter notes some difficulty with swallowing, occurs with both liquids and solids. She has been evaluated by ENT in the past for this issue and instructed to follow up with SLT. She wishes to hold off on SLT at this time.    Initial visit 11/2102: She reports the symptoms began acutely around one week ago. Has been near continuous since then. She was driving a car when initially started, she noted double yellow line lines, they were horizontal to each other. Continues to see horizontal double images, though on occasion he feels it is vertical. She is unsure if it is worse or better with in one direction. It improves with covering of one eye. There is no eye pain. No facial weakness, no numbness no extremity weakness or sensory changes. No tinnitus or abnormal sounds in her ear. No ear pain. Has baseline decreased hearing in her right ear. Has had cataract surgery in her left eye. She saw an optometrist after the onset of the symptoms, he noted normal eye exam as such as she followup with a neurologist. She also saw her primary care physician who ordered an MRI which was reported to be normal, and instructed to followup with a neurologist. Denies any prior history of the symptoms. No history of diabetes. No thyroid disorder. She does have a history of lymphoma in the past which is followed by her  oncologist.   A few weeks ago she did notice some drooping in her left old lower eyelid, and this has been continuous. She was told it was a ptosis due to weakening of the eye muscles.   Review of Systems: Out of a complete 14 system review, the patient complains of only the following symptoms, and all other reviewed systems are negative. Positive ptosis  History   Social History  . Marital Status: Widowed    Spouse Name: N/A    Number of Children: 4  . 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 Smoking status: Never Smoker   . Smokeless tobacco: Never Used  . Alcohol Use: No  . Drug Use: No  . Sexual Activity: Not on file   Other Topics Concern  . Not on file   Social History Narrative   Patient lives at home with son.   Patient has four children.   Patient is retired.   Patient has a high school education.   Patient is right-handed.   Patient drinks very little caffeine.     Family History  Problem Relation Age of Onset  . Heart Problems Mother   . Cancer Father     Past Medical History  Diagnosis Date  . Hypertension   . Thyroid disorder     Past Surgical History  Procedure Laterality Date  . Nm pet lymphoma  Current Outpatient Prescriptions  Medication Sig Dispense Refill  . aspirin EC 81 MG tablet Take 81 mg by mouth daily.      Marland Kitchen atenolol (TENORMIN) 25 MG tablet Take 25 mg by mouth daily.       . benazepril-hydrochlorthiazide (LOTENSIN HCT) 20-12.5 MG per tablet Take 1 tablet by mouth daily.       . clotrimazole-betamethasone (LOTRISONE) cream as needed.       Marland Kitchen levothyroxine (SYNTHROID, LEVOTHROID) 75 MCG tablet Take 75 mcg by mouth daily.       Marland Kitchen pyridostigmine (MESTINON) 60 MG tablet Take 0.5 tablets (30 mg total) by mouth 3 (three) times daily. Marland Kitchen5 tab 1x daily for 3 day, then .5 tab 2x daily for 3 days then 0.5mg  tid  60 tablet  3   No current facility-administered medications for  this visit.    Allergies as of 08/25/2013 - Review Complete 08/25/2013  Allergen Reaction Noted  . Sulfa antibiotics  12/07/2012    Vitals: BP 118/60  Pulse 59 Last Weight:  Wt Readings from Last 1 Encounters:  03/31/13 219 lb (99.338 kg)   Last Height:   Ht Readings from Last 1 Encounters:  12/07/12 4\' 11"  (1.499 m)     Physical exam: Exam: Gen: NAD, conversant, obese Eyes: anicteric sclerae, moist conjunctivae HENT: [place] Neck: Trachea midline; supple,  Lungs: CTA, no wheezing, rales, rhonic                          CV: RRR, no MRG Abdomen: Soft, non-tender;  Extremities: No peripheral edema  Skin: Normal temperature, no rash,  Psych: Appropriate affect, pleasant  Neuro: MS: AA&Ox3, appropriately interactive, normal affect   Speech: fluent w/o paraphasic error  Memory: good recent and remote recall  CN: PERRL, EOMI, no nystagmus, minimal ptosis of right  lid noted, sensation intact to LT and vibration V1-V3 bilat, face symmetric, no weakness, hearing diminisned on the right, palate elevates symmetrically, shoulder shrug 5/5 bilat,  tongue protrudes midline, no fasiculations noted.  Motor: normal bulk and tone, decreased ROM R shoulder which patient attributes to chronic rotator cuff injury Strength: Weakness in RUE attributed to rotator cuff injury, proximal bilateral lower extremity weakness which has been a chronic issue  Coord: rapid alternating and point-to-point (FNF, HTS) movements intact.  Reflexes: symmetrical, bilat downgoing toes  Sens: LT intact in all extremities  Gait: posture, stance, stride and arm-swing normal. Tandem gait intact. Able to walk on heels and toes. Romberg absent.   Assessment:  After physical and neurologic examination, review of laboratory studies, imaging, neurophysiology testing and pre-existing records, assessment will be reviewed on the problem list.  Plan:  Treatment plan and additional workup will be reviewed  under Problem List.  1)Myasthenia gravis  [name] is a pleasant 80 year old woman who presents for follow up evaluation of binocular diplopia related to myasthenia gravis. Currently taking mestinon 30mg  BID and tolerating it well with resolution of her diplopia. Currently stable. Notes some difficulty swallowing, wishes to hold off on SLT consult at this time. Will refer mestinon. Follow up in 12 months or earlier if needed.

## 2013-08-25 NOTE — Patient Instructions (Signed)
Overall you are doing fairly well but I do want to suggest a few things today:   Remember to drink plenty of fluid, eat healthy meals and do not skip any meals. Try to eat protein with a every meal and eat a healthy snack such as fruit or nuts in between meals. Try to keep a regular sleep-wake schedule and try to exercise daily, particularly in the form of walking, 20-30 minutes a day, if you can.   As far as your medications are concerned, I would like to suggest you continue taking Mestinon 30mg  (1/2 tablet) twice a day. A refill was sent to the pharmacy  I would like to see you back in 12 months, sooner if we need to. Please call us with any interim questions, concerns, problems, updates or refill requests.   My clinical assistant and will answer any of your questions and relay your messages to me and also relay most of my messages to you.   Our phone number is [phone]. We also have an after hours call service for urgent matters and there is a physician on-call for urgent questions. For any emergencies you know to call 911 or go to the nearest emergency room

## 2013-08-30 ENCOUNTER — Telehealth: Payer: Self-pay | Admitting: Neurology

## 2013-11-30 NOTE — Telephone Encounter (Signed)
Noted  

## 2014-03-14 ENCOUNTER — Inpatient Hospital Stay: Payer: Self-pay | Admitting: Internal Medicine

## 2014-03-14 LAB — COMPREHENSIVE METABOLIC PANEL
ALT: 24 U/L
Albumin: 2.9 g/dL — ABNORMAL LOW (ref 3.4–5.0)
Alkaline Phosphatase: 69 U/L
Anion Gap: 3 — ABNORMAL LOW (ref 7–16)
BUN: 27 mg/dL — AB (ref 7–18)
Bilirubin,Total: 0.3 mg/dL (ref 0.2–1.0)
CO2: 31 mmol/L (ref 21–32)
Calcium, Total: 9.1 mg/dL (ref 8.5–10.1)
Chloride: 106 mmol/L (ref 98–107)
Creatinine: 1.45 mg/dL — ABNORMAL HIGH (ref 0.60–1.30)
EGFR (African American): 45 — ABNORMAL LOW
EGFR (Non-African Amer.): 37 — ABNORMAL LOW
Glucose: 145 mg/dL — ABNORMAL HIGH (ref 65–99)
Osmolality: 287 (ref 275–301)
Potassium: 5.1 mmol/L (ref 3.5–5.1)
SGOT(AST): 33 U/L (ref 15–37)
Sodium: 140 mmol/L (ref 136–145)
Total Protein: 7.2 g/dL (ref 6.4–8.2)

## 2014-03-14 LAB — URINALYSIS, COMPLETE
BILIRUBIN, UR: NEGATIVE
Bacteria: NONE SEEN
Blood: NEGATIVE
Glucose,UR: NEGATIVE mg/dL (ref 0–75)
Ketone: NEGATIVE
Leukocyte Esterase: NEGATIVE
Nitrite: NEGATIVE
PH: 5 (ref 4.5–8.0)
Protein: NEGATIVE
RBC,UR: 1 /HPF (ref 0–5)
Specific Gravity: 1.021 (ref 1.003–1.030)
Squamous Epithelial: 1
WBC UR: <1 /HPF (ref 0–5)

## 2014-03-14 LAB — CBC
HCT: 42.2 % (ref 35.0–47.0)
HGB: 13.4 g/dL (ref 12.0–16.0)
MCH: 29 pg (ref 26.0–34.0)
MCHC: 31.7 g/dL — ABNORMAL LOW (ref 32.0–36.0)
MCV: 91 fL (ref 80–100)
PLATELETS: 188 10*3/uL (ref 150–440)
RBC: 4.61 10*6/uL (ref 3.80–5.20)
RDW: 14.3 % (ref 11.5–14.5)
WBC: 18.4 10*3/uL — ABNORMAL HIGH (ref 3.6–11.0)

## 2014-03-14 LAB — PROTIME-INR
INR: 1
Prothrombin Time: 12.9 secs (ref 11.5–14.7)

## 2014-03-14 LAB — APTT: ACTIVATED PTT: 30.9 s (ref 23.6–35.9)

## 2014-03-14 LAB — CK: CK, Total: 230 U/L — ABNORMAL HIGH

## 2014-03-15 LAB — CBC WITH DIFFERENTIAL/PLATELET
BASOS PCT: 0.4 %
Basophil #: 0 10*3/uL (ref 0.0–0.1)
EOS PCT: 0.3 %
Eosinophil #: 0 10*3/uL (ref 0.0–0.7)
HCT: 35.8 % (ref 35.0–47.0)
HGB: 11.6 g/dL — AB (ref 12.0–16.0)
LYMPHS PCT: 22.4 %
Lymphocyte #: 2.1 10*3/uL (ref 1.0–3.6)
MCH: 29.1 pg (ref 26.0–34.0)
MCHC: 32.5 g/dL (ref 32.0–36.0)
MCV: 90 fL (ref 80–100)
MONO ABS: 1 x10 3/mm — AB (ref 0.2–0.9)
MONOS PCT: 10.5 %
NEUTROS ABS: 6.2 10*3/uL (ref 1.4–6.5)
NEUTROS PCT: 66.4 %
Platelet: 179 10*3/uL (ref 150–440)
RBC: 3.99 10*6/uL (ref 3.80–5.20)
RDW: 14.2 % (ref 11.5–14.5)
WBC: 9.3 10*3/uL (ref 3.6–11.0)

## 2014-03-15 LAB — BASIC METABOLIC PANEL
Anion Gap: 3 — ABNORMAL LOW (ref 7–16)
BUN: 29 mg/dL — ABNORMAL HIGH (ref 7–18)
CALCIUM: 8.3 mg/dL — AB (ref 8.5–10.1)
CHLORIDE: 106 mmol/L (ref 98–107)
Co2: 31 mmol/L (ref 21–32)
Creatinine: 1.33 mg/dL — ABNORMAL HIGH (ref 0.60–1.30)
EGFR (African American): 49 — ABNORMAL LOW
EGFR (Non-African Amer.): 41 — ABNORMAL LOW
Glucose: 112 mg/dL — ABNORMAL HIGH (ref 65–99)
OSMOLALITY: 286 (ref 275–301)
POTASSIUM: 4.6 mmol/L (ref 3.5–5.1)
SODIUM: 140 mmol/L (ref 136–145)

## 2014-03-16 LAB — BASIC METABOLIC PANEL
Anion Gap: 4 — ABNORMAL LOW (ref 7–16)
BUN: 23 mg/dL — AB (ref 7–18)
CHLORIDE: 104 mmol/L (ref 98–107)
CO2: 30 mmol/L (ref 21–32)
CREATININE: 1.26 mg/dL (ref 0.60–1.30)
Calcium, Total: 8.2 mg/dL — ABNORMAL LOW (ref 8.5–10.1)
EGFR (Non-African Amer.): 43 — ABNORMAL LOW
GFR CALC AF AMER: 52 — AB
Glucose: 100 mg/dL — ABNORMAL HIGH (ref 65–99)
OSMOLALITY: 279 (ref 275–301)
Potassium: 4.7 mmol/L (ref 3.5–5.1)
Sodium: 138 mmol/L (ref 136–145)

## 2014-03-16 LAB — CBC WITH DIFFERENTIAL/PLATELET
Basophil #: 0 10*3/uL (ref 0.0–0.1)
Basophil %: 0.3 %
EOS ABS: 0.1 10*3/uL (ref 0.0–0.7)
Eosinophil %: 1.5 %
HCT: 30.5 % — AB (ref 35.0–47.0)
HGB: 9.9 g/dL — ABNORMAL LOW (ref 12.0–16.0)
LYMPHS ABS: 1.2 10*3/uL (ref 1.0–3.6)
Lymphocyte %: 13.7 %
MCH: 29.5 pg (ref 26.0–34.0)
MCHC: 32.4 g/dL (ref 32.0–36.0)
MCV: 91 fL (ref 80–100)
Monocyte #: 0.8 x10 3/mm (ref 0.2–0.9)
Monocyte %: 9.4 %
NEUTROS PCT: 75.1 %
Neutrophil #: 6.6 10*3/uL — ABNORMAL HIGH (ref 1.4–6.5)
PLATELETS: 155 10*3/uL (ref 150–440)
RBC: 3.35 10*6/uL — ABNORMAL LOW (ref 3.80–5.20)
RDW: 14.1 % (ref 11.5–14.5)
WBC: 8.8 10*3/uL (ref 3.6–11.0)

## 2014-03-18 LAB — CBC WITH DIFFERENTIAL/PLATELET
BASOS ABS: 0 10*3/uL (ref 0.0–0.1)
BASOS PCT: 0.3 %
EOS PCT: 2.1 %
Eosinophil #: 0.2 10*3/uL (ref 0.0–0.7)
HCT: 26.1 % — ABNORMAL LOW (ref 35.0–47.0)
HGB: 8.5 g/dL — ABNORMAL LOW (ref 12.0–16.0)
LYMPHS ABS: 1.1 10*3/uL (ref 1.0–3.6)
Lymphocyte %: 10.5 %
MCH: 29.4 pg (ref 26.0–34.0)
MCHC: 32.4 g/dL (ref 32.0–36.0)
MCV: 91 fL (ref 80–100)
Monocyte #: 1 x10 3/mm — ABNORMAL HIGH (ref 0.2–0.9)
Monocyte %: 10.4 %
NEUTROS PCT: 76.7 %
Neutrophil #: 7.7 10*3/uL — ABNORMAL HIGH (ref 1.4–6.5)
Platelet: 166 10*3/uL (ref 150–440)
RBC: 2.88 10*6/uL — ABNORMAL LOW (ref 3.80–5.20)
RDW: 14.5 % (ref 11.5–14.5)
WBC: 10 10*3/uL (ref 3.6–11.0)

## 2014-03-18 LAB — BASIC METABOLIC PANEL
Anion Gap: 5 — ABNORMAL LOW (ref 7–16)
BUN: 17 mg/dL (ref 7–18)
Calcium, Total: 8.5 mg/dL (ref 8.5–10.1)
Chloride: 104 mmol/L (ref 98–107)
Co2: 30 mmol/L (ref 21–32)
Creatinine: 1.19 mg/dL (ref 0.60–1.30)
GFR CALC AF AMER: 56 — AB
GFR CALC NON AF AMER: 46 — AB
Glucose: 111 mg/dL — ABNORMAL HIGH (ref 65–99)
Osmolality: 280 (ref 275–301)
Potassium: 4.3 mmol/L (ref 3.5–5.1)
Sodium: 139 mmol/L (ref 136–145)

## 2014-03-19 LAB — CBC WITH DIFFERENTIAL/PLATELET
Basophil #: 0 10*3/uL (ref 0.0–0.1)
Basophil %: 0.5 %
Eosinophil #: 0.2 10*3/uL (ref 0.0–0.7)
Eosinophil %: 2.4 %
HCT: 24.5 % — AB (ref 35.0–47.0)
HGB: 8.1 g/dL — ABNORMAL LOW (ref 12.0–16.0)
LYMPHS PCT: 15.9 %
Lymphocyte #: 1.4 10*3/uL (ref 1.0–3.6)
MCH: 29.7 pg (ref 26.0–34.0)
MCHC: 33 g/dL (ref 32.0–36.0)
MCV: 90 fL (ref 80–100)
MONO ABS: 0.9 x10 3/mm (ref 0.2–0.9)
Monocyte %: 10.3 %
Neutrophil #: 6.1 10*3/uL (ref 1.4–6.5)
Neutrophil %: 70.9 %
Platelet: 198 10*3/uL (ref 150–440)
RBC: 2.73 10*6/uL — AB (ref 3.80–5.20)
RDW: 14.4 % (ref 11.5–14.5)
WBC: 8.6 10*3/uL (ref 3.6–11.0)

## 2014-03-20 LAB — CBC WITH DIFFERENTIAL/PLATELET
BASOS PCT: 0.5 %
Basophil #: 0 10*3/uL (ref 0.0–0.1)
EOS ABS: 0.2 10*3/uL (ref 0.0–0.7)
Eosinophil %: 2.1 %
HCT: 26.9 % — AB (ref 35.0–47.0)
HGB: 8.6 g/dL — ABNORMAL LOW (ref 12.0–16.0)
LYMPHS PCT: 16.8 %
Lymphocyte #: 1.7 10*3/uL (ref 1.0–3.6)
MCH: 28.6 pg (ref 26.0–34.0)
MCHC: 31.8 g/dL — ABNORMAL LOW (ref 32.0–36.0)
MCV: 90 fL (ref 80–100)
MONO ABS: 1.1 x10 3/mm — AB (ref 0.2–0.9)
MONOS PCT: 11.3 %
NEUTROS ABS: 6.8 10*3/uL — AB (ref 1.4–6.5)
NEUTROS PCT: 69.3 %
Platelet: 288 10*3/uL (ref 150–440)
RBC: 3 10*6/uL — AB (ref 3.80–5.20)
RDW: 14.4 % (ref 11.5–14.5)
WBC: 9.9 10*3/uL (ref 3.6–11.0)

## 2014-03-21 LAB — CREATININE, SERUM
Creatinine: 1.33 mg/dL — ABNORMAL HIGH (ref 0.60–1.30)
EGFR (Non-African Amer.): 41 — ABNORMAL LOW
GFR CALC AF AMER: 49 — AB

## 2014-03-22 LAB — CBC WITH DIFFERENTIAL/PLATELET
Basophil: 1 %
EOS PCT: 3 %
HCT: 27.3 % — AB (ref 35.0–47.0)
HGB: 8.9 g/dL — AB (ref 12.0–16.0)
Lymphocytes: 9 %
MCH: 29.4 pg (ref 26.0–34.0)
MCHC: 32.6 g/dL (ref 32.0–36.0)
MCV: 90 fL (ref 80–100)
Monocytes: 10 %
Platelet: 336 10*3/uL (ref 150–440)
RBC: 3.03 10*6/uL — AB (ref 3.80–5.20)
RDW: 14.5 % (ref 11.5–14.5)
SEGMENTED NEUTROPHILS: 77 %
WBC: 12.1 10*3/uL — ABNORMAL HIGH (ref 3.6–11.0)

## 2014-03-22 LAB — BASIC METABOLIC PANEL
ANION GAP: 6 — AB (ref 7–16)
BUN: 22 mg/dL — ABNORMAL HIGH (ref 7–18)
CREATININE: 1.19 mg/dL (ref 0.60–1.30)
Calcium, Total: 9.4 mg/dL (ref 8.5–10.1)
Chloride: 97 mmol/L — ABNORMAL LOW (ref 98–107)
Co2: 32 mmol/L (ref 21–32)
EGFR (African American): 56 — ABNORMAL LOW
GFR CALC NON AF AMER: 46 — AB
Glucose: 110 mg/dL — ABNORMAL HIGH (ref 65–99)
OSMOLALITY: 274 (ref 275–301)
POTASSIUM: 4.1 mmol/L (ref 3.5–5.1)
Sodium: 135 mmol/L — ABNORMAL LOW (ref 136–145)

## 2014-03-24 LAB — URINALYSIS, COMPLETE
BILIRUBIN, UR: NEGATIVE
Bacteria: NONE SEEN
GLUCOSE, UR: NEGATIVE mg/dL (ref 0–75)
Ketone: NEGATIVE
Nitrite: NEGATIVE
Ph: 7 (ref 4.5–8.0)
Protein: NEGATIVE
SPECIFIC GRAVITY: 1.006 (ref 1.003–1.030)
Squamous Epithelial: 3
WBC UR: 115 /HPF (ref 0–5)

## 2014-03-24 LAB — CBC WITH DIFFERENTIAL/PLATELET
BASOS ABS: 0.1 10*3/uL (ref 0.0–0.1)
Basophil %: 0.5 %
Eosinophil #: 0.3 10*3/uL (ref 0.0–0.7)
Eosinophil %: 2.4 %
HCT: 28.1 % — ABNORMAL LOW (ref 35.0–47.0)
HGB: 9 g/dL — ABNORMAL LOW (ref 12.0–16.0)
LYMPHS ABS: 1.8 10*3/uL (ref 1.0–3.6)
LYMPHS PCT: 15.5 %
MCH: 29.2 pg (ref 26.0–34.0)
MCHC: 32.1 g/dL (ref 32.0–36.0)
MCV: 91 fL (ref 80–100)
MONO ABS: 0.9 x10 3/mm (ref 0.2–0.9)
Monocyte %: 8 %
Neutrophil #: 8.4 10*3/uL — ABNORMAL HIGH (ref 1.4–6.5)
Neutrophil %: 73.6 %
PLATELETS: 432 10*3/uL (ref 150–440)
RBC: 3.08 10*6/uL — ABNORMAL LOW (ref 3.80–5.20)
RDW: 14.5 % (ref 11.5–14.5)
WBC: 11.4 10*3/uL — ABNORMAL HIGH (ref 3.6–11.0)

## 2014-03-24 LAB — BASIC METABOLIC PANEL
Anion Gap: 5 — ABNORMAL LOW (ref 7–16)
BUN: 22 mg/dL — ABNORMAL HIGH (ref 7–18)
CALCIUM: 9.1 mg/dL (ref 8.5–10.1)
Chloride: 95 mmol/L — ABNORMAL LOW (ref 98–107)
Co2: 34 mmol/L — ABNORMAL HIGH (ref 21–32)
Creatinine: 1.17 mg/dL (ref 0.60–1.30)
EGFR (African American): 57 — ABNORMAL LOW
EGFR (Non-African Amer.): 47 — ABNORMAL LOW
GLUCOSE: 99 mg/dL (ref 65–99)
Osmolality: 272 (ref 275–301)
Potassium: 4.5 mmol/L (ref 3.5–5.1)
Sodium: 134 mmol/L — ABNORMAL LOW (ref 136–145)

## 2014-04-05 ENCOUNTER — Ambulatory Visit: Payer: Self-pay | Admitting: Unknown Physician Specialty

## 2014-05-17 ENCOUNTER — Observation Stay: Payer: Self-pay | Admitting: Internal Medicine

## 2014-05-17 LAB — COMPREHENSIVE METABOLIC PANEL
Albumin: 2.6 g/dL — ABNORMAL LOW (ref 3.4–5.0)
Alkaline Phosphatase: 82 U/L
Anion Gap: 7 (ref 7–16)
BILIRUBIN TOTAL: 0.3 mg/dL (ref 0.2–1.0)
BUN: 15 mg/dL (ref 7–18)
Calcium, Total: 8.7 mg/dL (ref 8.5–10.1)
Chloride: 100 mmol/L (ref 98–107)
Co2: 30 mmol/L (ref 21–32)
Creatinine: 1.13 mg/dL (ref 0.60–1.30)
EGFR (African American): 59 — ABNORMAL LOW
GFR CALC NON AF AMER: 49 — AB
Glucose: 99 mg/dL (ref 65–99)
OSMOLALITY: 275 (ref 275–301)
Potassium: 4.4 mmol/L (ref 3.5–5.1)
SGOT(AST): 39 U/L — ABNORMAL HIGH (ref 15–37)
SGPT (ALT): 24 U/L
SODIUM: 137 mmol/L (ref 136–145)
TOTAL PROTEIN: 6.5 g/dL (ref 6.4–8.2)

## 2014-05-17 LAB — URINALYSIS, COMPLETE
BACTERIA: NONE SEEN
Bilirubin,UR: NEGATIVE
Blood: NEGATIVE
Glucose,UR: NEGATIVE mg/dL (ref 0–75)
KETONE: NEGATIVE
Nitrite: NEGATIVE
Ph: 7 (ref 4.5–8.0)
Protein: NEGATIVE
Specific Gravity: 1.017 (ref 1.003–1.030)
Squamous Epithelial: 1

## 2014-05-17 LAB — CBC WITH DIFFERENTIAL/PLATELET
BASOS ABS: 0.1 10*3/uL (ref 0.0–0.1)
Basophil %: 0.8 %
Eosinophil #: 0.2 10*3/uL (ref 0.0–0.7)
Eosinophil %: 1.6 %
HCT: 36.6 % (ref 35.0–47.0)
HGB: 11.6 g/dL — ABNORMAL LOW (ref 12.0–16.0)
Lymphocyte #: 2.6 10*3/uL (ref 1.0–3.6)
Lymphocyte %: 26.3 %
MCH: 28.9 pg (ref 26.0–34.0)
MCHC: 31.7 g/dL — ABNORMAL LOW (ref 32.0–36.0)
MCV: 91 fL (ref 80–100)
MONOS PCT: 8.3 %
Monocyte #: 0.8 x10 3/mm (ref 0.2–0.9)
NEUTROS PCT: 63 %
Neutrophil #: 6.2 10*3/uL (ref 1.4–6.5)
PLATELETS: 242 10*3/uL (ref 150–440)
RBC: 4.02 10*6/uL (ref 3.80–5.20)
RDW: 15.8 % — ABNORMAL HIGH (ref 11.5–14.5)
WBC: 9.8 10*3/uL (ref 3.6–11.0)

## 2014-05-17 LAB — LIPASE, BLOOD: LIPASE: 90 U/L (ref 73–393)

## 2014-05-17 LAB — TROPONIN I: Troponin-I: 0.08 ng/mL — ABNORMAL HIGH

## 2014-05-17 LAB — CK-MB: CK-MB: 0.7 ng/mL (ref 0.5–3.6)

## 2014-05-18 DIAGNOSIS — I10 Essential (primary) hypertension: Secondary | ICD-10-CM | POA: Diagnosis not present

## 2014-05-18 DIAGNOSIS — R7989 Other specified abnormal findings of blood chemistry: Secondary | ICD-10-CM | POA: Diagnosis not present

## 2014-05-18 LAB — TROPONIN I
TROPONIN-I: 0.06 ng/mL — AB
Troponin-I: 0.06 ng/mL — ABNORMAL HIGH

## 2014-05-18 LAB — CK-MB
CK-MB: 0.8 ng/mL (ref 0.5–3.6)
CK-MB: 0.9 ng/mL (ref 0.5–3.6)

## 2014-07-06 ENCOUNTER — Inpatient Hospital Stay: Payer: Self-pay | Admitting: Internal Medicine

## 2014-08-18 NOTE — Consult Note (Signed)
Brief Consult Note: Diagnosis: Left proximal femur fracture.   Patient was seen by consultant.   Consult note dictated.   Recommend to proceed with surgery or procedure.   Orders entered.   Comments: Patient will require surgical fixaiton of the left proximal femur fracture.  Plan for surgery tomorrow if medically cleared.    CT scan ordered of right shoulder given her pain in this area and limited ROM.  Electronic Signatures: Thornton Park (MD)  (Signed [phone] 00:18)  Authored: Brief Consult Note   Last Updated: 19-Nov-15 00:18 by Thornton Park (MD)

## 2014-08-18 NOTE — Consult Note (Signed)
PATIENT NAME:  [name], [name] MR#:  [id number] DATE OF BIRTH:  [birth year]  DATE OF CONSULTATION:  03/23/2014  CONSULTING PHYSICIAN:  Harrell Gave A. Cyrstal Leitz, MD  REASON FOR CONSULTATION:  Poor appetite, mild abdominal pain and cecal dilatation.   HISTORY OF PRESENT ILLNESS:  [name] is a pleasant 81 year old female who presented with a history of hypertension, hyperlipidemia, hypothyroidism and B-cell lymphoma and myasthenia gravis who presented on 11/18 following a fall.  She was noted to have pain in her left hip and underwent internal fixation of left hip fracture with intramedullary fixation on 11/19.  Since then, she has been seen for postoperative ileus with dilated cecum.  She has been having bowel movements and passing gas and notices that she is a little more tender in the right lower quadrant and continues to have dilated ileus, no fevers, chills. No tachycardia. No hypertension, no chest pain, shortness of breath, cough, nausea, vomiting, just poor appetite. No dysuria or hematuria.   PAST MEDICAL HISTORY:  1.  Left hip fracture status post repair on 03/15/2014.  2.  Hypertension.  3.  Hypothyroidism.  4.  Hyperlipidemia.  5.  Sleep apnea.  6.  History of B-cell lymphoma.  7.  Myasthenia gravis.   HOME MEDICATIONS:  1.  Aspirin. 2.  Atenolol. 3. 4.  L-thyroxine.   5.  Benazepril.  6.  Multivitamin.   SOCIAL HISTORY:  Denies tobacco, alcohol or drug use.   FAMILY HISTORY: Diabetes and coronary artery disease.   ALLERGIES: BETADINE, SULFA, VALIUM   REVIEW OF SYSTEMS:  A 12 point review of systems obtained. Pertinent positives and negatives as above.   PHYSICAL EXAMINATION:  VITAL SIGNS: Temperature 98.7, pulse 79, blood pressure 128/74, respirations 18 and 95% on room air.  GENERAL: No acute distress, alert and oriented x 3.  HEAD: Normocephalic, atraumatic.  EYES: No scleral icterus. No conjunctivitis.  FACE: No evidence of face trauma.  Normal external nose  and ears. CHEST:  Lungs clear to auscultation.  Moving air well.  HEART: Regular rate and rhythm. No murmurs, rubs or gallops.  ABDOMEN: Soft, mildly tender, more prominent on the right side of his abdomen. No peritonitis.  EXTREMITIES: Moves all extremities well. Strength 5/5.  NEUROLOGIC: Cranial nerves II through XII grossly intact to all 4 extremities.    LABORATORY DATA:  White cell count 12.1, hemoglobin 8.9, hematocrit 27.3, creatinine 1.19, bicarbonate 32.  CT scan shows dilated cecum with no transmural necrosis or pneumatosis.   No obvious volvulus, does appear to taper gradually into distal colon without any obvious drastic change concerning for malignancy or mass.  No intraabdominal fluid.  No free air.    ASSESSMENT AND PLAN:  [name] is a pleasant 81 year old who presents with likely a postoperative ileus, would minimize narcotic use if possible, also recommend the PT and activity per orthopedic guidelines. No obvious indication for surgery at this time.  We will continue to follow.     ____________________________ Glena Norfolk Lyzbeth Genrich, MD cal:DT D: 03/23/2014 16:10:32 ET T: 03/23/2014 16:34:04 ET JOB#: [id number]  cc: Harrell Gave A. Jacy Howat, MD, <Dictator> Floyde Parkins MD ELECTRONICALLY SIGNED 04/02/2014 20:09

## 2014-08-18 NOTE — Consult Note (Signed)
PATIENT NAME:  [name], ROTUNDO MR#:  [id number] DATE OF BIRTH:  05-13-1929  DATE OF CONSULTATION:  03/19/2014  REFERRING PHYSICIAN:   CONSULTING PHYSICIAN:  Manya Silvas, MD  HISTORY OF PRESENT ILLNESS: The patient is an 81 year old white female who had hip replacement 5 days ago and has problems with postoperative distention and ileus. I was asked to see her in consultation.   The patient says that she began passing a little gas last night and has passed the a lot more gas today. She had an x-ray previously that showed 13.5 cm cecum; today it is 12 cm. This correlates with her passing gas better. She has been on appropriate pain medication for her postoperative problems which certainly has made it difficult for her bowels to resume usual function, although the pain medicine certainly is appropriate and needed for this condition.   PAST MEDICAL HISTORY: Includes hypertension, hyperlipidemia, hypothyroidism, B-cell lymphoma, myasthenia gravis. Unspecified obstructive sleep apnea, noncompliant with BiPAP or CPAP.   REVIEW OF SYSTEMS: No shortness of breath. No chest pains. No severe abdominal pain. No vomiting.   ALLERGIES: BETADINE, SULFA, AND VALIUM.   HOME MEDICATIONS: Aspirin 81 mg a day, atenolol 25 mg a day, pyridostigmine 30 mg 3 times a day. She self-discontinued 2 weeks ago. Levothyroxine 75 mcg a day, benazepril 40 mg daily, and multivitamins 1 a day.   PHYSICAL EXAMINATION: GENERAL: Obese white female, pleasant, in no acute distress. The patient is seen with 2 family members.  VITAL SIGNS: Temperature 98.6, pulse 90, respirations 24, blood pressure 96/63, oxygen saturation 93% on room air.  HEENT: Sclerae anicteric. Conjunctivae slightly pale.  HEAD: Atraumatic. Trachea is in the midline.  CHEST: Clear in the anterior fields.  HEART: No murmurs or gallops I can hear.  ABDOMEN: Shows old gallbladder scar. The abdomen is distended. No significant tenderness. Scattered bowel  sounds are present.   IMAGING DATA: Abdominal film shows a 12 cm cecum with some evidence of an ileus throughout most of the colon.   ASSESSMENT: Postoperative ileus in a patient with the usual amount of pain medicine for previous hip surgery. I am a little concerned about her disruption of her therapy for myasthenia gravis, whether or not this could have an effect on her bowel function and I plan to consult neurology in the morning. There is a little improvement from her last x-ray.   RECOMMENDATIONS: I would give her a Fleet enema and put her on Dulcolax suppository b.i.d. to try to get her bowels moving somewhat. Will ask neurology about the pyridostigmine in this clinical situation. I will follow with you.    ____________________________ Manya Silvas, MD rte:dw D: 03/19/2014 19:24:10 ET T: 03/19/2014 20:21:04 ET JOB#: [id number]  cc: Manya Silvas, MD, <Dictator> Timoteo Gaul, MD Manya Silvas MD ELECTRONICALLY SIGNED 03/21/2014 8:18

## 2014-08-18 NOTE — Discharge Summary (Signed)
PATIENT NAME:  [name], [name] MR#:  [id number] DATE OF BIRTH:  [birth year]  DATE OF ADMISSION:  03/14/2014 DATE OF DISCHARGE:  03/19/2014   STAT DISCHARGE SUMMARY   ADMITTING PHYSICIAN: Aaron Mose. Hower, MD  DISCHARGING PHYSICIAN: Gladstone Lighter, MD  PRIMARY CARE PHYSICIAN: Lavera Guise, MD  [place]: Orthopedic consultation by Timoteo Gaul, MD.   DISCHARGE DIAGNOSES:  1.  Fall and left hip fracture, status post operative reduction and internal fixation on 03/15/2014.  2.  Hypertension.  3.  Hyperlipidemia.  4.  Sleep apnea.  5.  Myasthenia gravis, which is a relatively new diagnosis.  6.  Postoperative anemia, has not required blood transfusion so far.  7.  Acute renal failure, which is resolved.   DISCHARGE HOME MEDICATIONS:  1.  Vitamin D with minerals tablet, 1 tablet p.o. daily.  2.  Atenolol 25 mg p.o. daily.  3.  Levothyroxine 75 mcg p.o. daily.  4.  Aspirin 81 mg p.o. daily.  5.  Norco 5/325 mg 1 tablet q. 4 hours p.r.n. for pain.  6.  Flomax 0.4 mg p.o. daily.  7.  Albuterol inhaler 2 puffs q. 6 hours p.r.n. for wheezing.  8.  Nystatin topical powder under the abdominal folds 3 times a day.  9.  Calcium with Vitamin D 500 mg/200 international units 1 tablet orally twice a day.  10.  Colace 100 mg p.o. b.i.d. p.r.n. for stool softener.  11.  Ferrous sulfate 325 mg p.o. b.i.d. with meals.  12.  Lovenox 40 mg subcutaneous once a day for 24 days.  13.  Tylenol 650 mg q. 4 hours p.r.n. for pain or fever.   DISCHARGE DIET: Low-sodium diet.   DISCHARGE ACTIVITY: As tolerated.   FOLLOWUP INSTRUCTIONS:  1.  PCP followup in 2-3 weeks.  2.  Physical therapy.  3.  Hemoglobin check at rehab in 2-3 days.  4.  Ortho followup in 1 week with Dr. Mack Guise.  5.  Follow up with neurologist as per schedule for myasthenia gravis.   ORTHOPEDIC FOLLOWUP INSTRUCTION: Orthopedic instructions for a hip fracture. Please look at the instructions filled by  Dr. Mack Guise about weightbearing and followup.   LABORATORY DATA AND IMAGING STUDIES PRIOR TO DISCHARGE:  WBC 8.6, hemoglobin 8.1, hematocrit 24.2, platelet count 198,000.  Sodium 139, potassium 4.3, chloride 104, bicarbonate 30, BUN 17, creatinine 1.19, glucose 111 and calcium of 8.5.  Left femoral x-ray after surgery showing cortical irregularity; postoperative reduction and fixation noted. CT right shoulder without contrast showing severe degenerative changes of the right shoulder; no acute fracture or dislocation noted. Old humeral fracture with healing and angulation at fracture site noted. Urinalysis was negative for any infection.   Chest x-ray on admission showing cardiomegaly, blunting of the left costophrenic angle reflecting atelectasis or small pleural effusion noted.   BRIEF HOSPITAL COURSE: Ms. Forget is an 81 year old elderly Caucasian female with past medical history significant for hypertension, hyperlipidemia, history of B-cell lymphoma, recent diagnosis of myasthenia gravis and obstructive sleep apnea, who was brought in after a fall and hip pain. She was noted to have a left hip fracture.  1.   Left hip fracture. She has a displaced proximal femoral fracture extending to the lesser trochanter noted on admission x-ray, seen by orthopedics Dr. Mack Guise. Had her surgery done on 03/15/2014. Postoperatively, the patient has not had many complications. She did have some postoperative anemia after the surgery; hemoglobin has remained stable at 8. No active bleeding. Not symptomatic  at this time, so continue to monitor in a couple of days again. Continue iron supplements at this time. Seen by orthopedics. Patient worked with physical therapy and they recommended rehab. Today is postoperative day 4. 2.  Acute renal failure on admission, resolved with IV hydration. She did have some urinary retention requiring in-and-out catheterization, and was started on Flomax with improvement.  3.   Hypertension. Her atenolol is being continued. She is also on Benazepril at home, which has been stopped since her blood pressure has not been up after the surgery.  4.  Myasthenia gravis. The patient was relatively newly diagnosed about a year ago, started Mestinon; however, stopped taking it even prior to hospitalization because of multiple affects. Family aware. She will check back with her neurologist about other options, and did not want to be on the medication at this time. No significant weakness noted. Respiratory muscle fatigue is not noted. She was advised to use the incentive spirometry.  5.  The patient was started on Lovenox for deep vein thrombosis prophylaxis for a total of 4 weeks, and will follow up with orthopedics in a couple of weeks.   Her course has been otherwise uneventful in the hospital.   DISCHARGE CONDITION: Stable.   DISCHARGE DISPOSITION: To short-term rehabilitation at [facility].   CODE STATUS: Full Code.   TIME SPENT ON DISCHARGE: 40 minutes.    ____________________________ Gladstone Lighter, MD rk:MT D: 03/19/2014 15:00:00 ET T: 03/19/2014 15:33:18 ET JOB#: [id number]  cc: Gladstone Lighter, MD, <Dictator> Lavera Guise, MD Timoteo Gaul, MD Gladstone Lighter MD ELECTRONICALLY SIGNED 04/13/2014 14:08

## 2014-08-18 NOTE — Consult Note (Signed)
Chief Complaint:  Subjective/Chief Complaint seen for post operative ileus.  was doing better until this am.  mild increase of abdominal discomfort.  abd films indicatingf enlarging cecum/ileus.   minimal nausea no emesis, some increased discomfort in the llq.   VITAL SIGNS/ANCILLARY NOTES: **Vital Signs.:   27-Nov-15 04:55  Vital Signs Type Routine  Temperature Temperature (F) 97.6  Celsius 36.4  Temperature Source oral  Pulse Pulse 80  Respirations Respirations 18  Systolic BP Systolic BP 828  Diastolic BP (mmHg) Diastolic BP (mmHg) 50  Mean BP 67  Pulse Ox % Pulse Ox % 100  Pulse Ox Activity Level  At rest  Oxygen Delivery Room Air/ 21 %  *Intake and Output.:   27-Nov-15 10:58  Stool  Large loose   Brief Assessment:  GEN obese   Cardiac Regular   Respiratory clear BS   Gastrointestinal details normal mild distension, positive bowel sounds, no rushes or high pitch sounds.  positive tender to palpation in the lower abdomen, esp middle to left abdomen.   Lab Results: Routine Chem:  26-Nov-15 05:30   Glucose, Serum  110  BUN  22  Creatinine (comp) 1.19  Sodium, Serum  135  Potassium, Serum 4.1  Chloride, Serum  97  CO2, Serum 32  Calcium (Total), Serum 9.4  Anion Gap  6  Osmolality (calc) 274  eGFR (African American)  56  eGFR (Non-African American)  46 (eGFR values <27mL/min/1.73 m2 may be an indication of chronic kidney disease (CKD). Calculated eGFR, using the MRDR Study equation, is useful in  patients with stable renal function. The eGFR calculation will not be reliable in acutely ill patients when serum creatinine is changing rapidly. It is not useful in patients on dialysis. The eGFR calculation may not be applicable to patients at the low and high extremes of body sizes, pregnant women, and vegetarians.)  Routine Hem:  18-Nov-15 21:17   Hemoglobin (CBC) 13.4  19-Nov-15 05:52   Hemoglobin (CBC)  11.6  20-Nov-15 06:51   Hemoglobin (CBC)  9.9   22-Nov-15 03:36   Hemoglobin (CBC)  8.5  23-Nov-15 04:53   Hemoglobin (CBC)  8.1  24-Nov-15 08:14   Hemoglobin (CBC)  8.6  26-Nov-15 05:30   WBC (CBC)  12.1  RBC (CBC)  3.03  Hemoglobin (CBC)  8.9  Hematocrit (CBC)  27.3  Platelet Count (CBC) 336 (Result(s) reported on 22 Mar 2014 at 07:23AM.)  MCV 90  MCH 29.4  MCHC 32.6  RDW 14.5  Segmented Neutrophils 77  Lymphocytes 9  Monocytes 10  Eosinophil 3  Basophil 1  Diff Comment 1 ANISOCYTOSIS  Diff Comment 2 POLYCHROMASIA  Diff Comment 3 PLTS VARIED IN SIZE  Result(s) reported on 22 Mar 2014 at 07:23AM.   Radiology Results: XRay:    26-Nov-15 09:38, KUB - Kidney Ureter Bladder  KUB - Kidney Ureter Bladder   REASON FOR EXAM:    f/u cecal diltation and ileus  COMMENTS:       PROCEDURE: DXR - DXR KIDNEY URETER BLADDER  - Mar 22 2014  9:38AM     CLINICAL DATA:  Cecal dilatation and ileus.    EXAM:  ABDOMEN - 1 VIEW    COMPARISON:  Plain films of the abdomen11/24/2015 and 03/21/2014.    FINDINGS:  There is persistent gaseous distention of the colon and dilatation  of the cecum. The cecum is estimated 12.6 cm in diameter, slightly  increased from the 11/20 07/2013 study.     IMPRESSION:  Persisting gaseous  distention of the colon, worst in the cecum,  compatible with ileus.      Electronically Signed    By: Inge Rise M.D.    On: 03/22/2014 10:23         Verified By: Ramond Dial, M.D.,    [phone] 08:29, KUB - Kidney Ureter Bladder  KUB - Kidney Ureter Bladder   REASON FOR EXAM:    f/u cecal dilatation and ileus  COMMENTS:       PROCEDURE: DXR - DXR KIDNEY URETER BLADDER  - Mar 23 2014  8:29AM     CLINICAL DATA:  Followup ileus. Postop fixation of left hip  fracture.    EXAM:  ABDOMEN - 1 VIEW    COMPARISON: 03/22/2014    FINDINGS:  Air is present throughout the colon with distention of the cecum  measuring 15 cm in diameter slightly worse. There are a few  air-filled small bowel  loops in the left mid to lower abdomen with  mild dilatation. No evidence of free peritoneal air. Remainder of  the exam is unchanged.     IMPRESSION:  Air-filled large and small bowel with slight worsening of gaseous  distention of the cecum measuring 15 cm in diameter. Several  air-filled minimally dilated small bowel loops in the left mid to  lower abdomen. Pattern most suggestive of ongoing ileus.      Electronically Signed    By: Marin Olp M.D.    On: 03/23/2014 08:42     Verified By: Pearletha Alfred, M.D.,   Assessment/Plan:  Assessment/Plan:  Assessment 1) post-operative ileus. worsening xrays since yesterday.  patietn states that after the films were done this am she had a large bm and passed alot of gas.  Currently awaiting CT abd and pelvis.  if there is continued evidence of enlarging cecal distension, would consult surgery.  Mobilization this am may have been of some benefits.   Plan following-Dr Rayann Heman to see tomorrow. am films depending on clinical situation.   Electronic Signatures: Loistine Simas (MD)  (Signed [phone] 11:57)  Authored: Chief Complaint, VITAL SIGNS/ANCILLARY NOTES, Brief Assessment, Lab Results, Radiology Results, Assessment/Plan   Last Updated: 27-Nov-15 11:57 by Loistine Simas (MD)

## 2014-08-18 NOTE — Consult Note (Signed)
Pt had good bowel movements this am.  She is not sure if it was before or after KUB.  Exam today is definitely better than yesterday with less bloating, abd less tight, bowel sounds are better.  Likely can go tomorrow unless any setback occurs.  Electronic Signatures: Manya Silvas (MD)  (Signed on 26-Nov-15 14:34)  Authored  Last Updated: 26-Nov-15 14:34 by Manya Silvas (MD)

## 2014-08-18 NOTE — Consult Note (Signed)
Pt with distended abd, decreased bowel sounds compared to yesterday.  Nurses report 3 stools.  Continue meds, follow x-ray, check lytes tomorrow.  Coffee may help some and will have nurse get her some (decaf)  Electronic Signatures: Manya Silvas (MD)  (Signed on 25-Nov-15 15:24)  Authored  Last Updated: 25-Nov-15 15:24 by Manya Silvas (MD)

## 2014-08-18 NOTE — Discharge Summary (Signed)
PATIENT NAME:  [name], [name] MR#:  [id number] DATE OF BIRTH:  April 01, 1930  DATE OF ADMISSION:  03/14/2014 DATE OF DISCHARGE:  03/22/2014   ADDENDUM:  DISCHARGING PHYSICIAN: Gladstone Lighter, MD.  PRIMARY CARE PHYSICIAN: Lavera Guise, MD.  [place]:  1.  Orthopedic consultation by Timoteo Gaul, MD.  2.  GI consultation by Manya Silvas, MD.   For more details, please look at the discharge summary dictated by Dr. Tressia Miners on 03/19/2014. The patient was scheduled to be discharged to [facility] after her left hip fracture repair on 03/19/2014; however, she has developed postoperative ileus on that day. She had serial KUBs; at which time, her cecal dilatation diameter was greater than 12 cm. She was started on suppository, seen by GI. She has been having bowel movements. Her last cecal dilatation from KUB this morning shows about 10 cm with significant improvement, and she has good bowel sounds and able to eat and work with physical therapy better. Only changes to her medications are her Colace is now scheduled 100 mg p.o. b.i.d. She has Dulcolax by mouth 10 mg tablet daily for constipation and orders to hold it if greater than 2 bowel movements per day and MiraLax powder daily p.r.n. for constipation have been added. She has a KUB followup first thing in the morning tomorrow. If that shows improvement, she should be able to be discharged to [facility]. Her course has been otherwise uneventful.   Additional time spent 35 minutes.    ____________________________ Gladstone Lighter, MD [phone] D: 03/21/2014 15:12:56 ET T: 03/21/2014 15:28:08 ET JOB#: [id number]  cc: Gladstone Lighter, MD, <Dictator> Timoteo Gaul, MD Lavera Guise, MD  Gladstone Lighter MD ELECTRONICALLY SIGNED 04/13/2014 14:08

## 2014-08-18 NOTE — Consult Note (Signed)
Pt abd a little less tight today, bowel sounds are a little more numerous.  Pt passed signif amt of gas today.  Getting dulcolax supp bid, lactulose started.  did get out of bed and was on portable toilet.  It has not been a week since surgery.  She is starting to turn the corner on this problem but will need continual care for a few more days. Her lungs sound better today.    Electronic Signatures: Manya Silvas (MD)  (Signed on 24-Nov-15 17:36)  Authored  Last Updated: 24-Nov-15 17:36 by Manya Silvas (MD)

## 2014-08-18 NOTE — Discharge Summary (Signed)
PATIENT NAME:  [name], [name] MR#:  [id number] DATE OF BIRTH:  09-21-1929  DATE OF ADMISSION:  03/14/2014 DATE OF DISCHARGE:  03/24/2014  ADDENDUM:  ADMITTING PHYSICIAN: Aaron Mose. Hower, MD.   DISCHARGING PHYSICIAN: Gladstone Lighter, MD.    PRIMARY CARE PHYSICIAN: Lavera Guise, MD.   [place]:  1.  Orthopedic consultation by Dr. Thornton Park.  2.  GI consultation by Dr. Gaylyn Cheers.   HOSPITAL COURSE: For more details, look at the discharge summary dictated by Dr. Tressia Miners on 03/19/2014 and also dictated on 03/22/2014. The patient had hip surgery done on 03/15/2014 by Dr. Mack Guise for fall and left hip fracture. Postoperatively her course has been complicated by severe ileus and cecal dilatation as noted on KUBs. The patient's KUBs were consistently showing her ileus was dilated at 10 to 12 cm and as high as 15 cm. Clinically she was significantly improving. She was having constant bowel movements. No stool noted. No bowel obstruction noted. A CT of her abdomen and pelvis done with oral contrast and it showed the cecum is only mildly dilated. There is no abnormal wall thickening or inflammation noted. In speaking to the radiologist and also GI doctor, the patient might have marginal dilatation of cecum at baseline and with gaseous distention it is worse, but clinically since she is improving she will need to get physical therapy, so is okay for discharge to rehab. KUB followup in a couple of days and if any abdominal symptoms come up and if she constipated again, then we will need to bring her to the Emergency Room again.   Also of note, she was having some dysuria. UA done shows 3+ leukocyte esterase and multiple WBCs and no bacteria, but because she is symptomatic will start her on p.o. Levaquin.   ADDITIONAL TIME SPENT: 35 minutes.     ____________________________ Gladstone Lighter, MD rk:at D: 03/24/2014 12:07:32 ET T: 03/24/2014 12:17:39  ET JOB#: [id number]  cc: Gladstone Lighter, MD, <Dictator> Lavera Guise, MD Gladstone Lighter MD ELECTRONICALLY SIGNED 04/13/2014 14:06

## 2014-08-18 NOTE — Op Note (Signed)
PATIENT NAME:  Christy, Rodriguez MR#:  [id number] DATE OF BIRTH:  06-Sep-1929  DATE OF PROCEDURE:  03/15/2014  PREOPERATIVE DIAGNOSIS:  Comminuted left subtrochanteric fracture.  POSTOPERATIVE DIAGNOSIS:  Comminuted left subtrochanteric fracture.   PROCEDURE: Intramedullary fixation of left comminuted subtrochanteric hip fracture with intramedullary fixation.   SURGEON: Thornton Park, MD.  ANESTHESIA: Spinal.   COMPLICATIONS: None.   IMPLANTS: Biomet AFFIXUS long intramedullary rod.   INDICATION FOR THE PROCEDURE: [name] is an 81 year old female who sustained a mechanical fall. She was diagnosed with a comminuted subtrochanteric fracture by x-ray in the ER. I had recommended intramedullary fixation for this fracture. The patient was cleared by the hospitalist service for surgery. I reviewed all labs and radiographic studies in preparation for this surgery.   The patient's left leg was marked with the word yes according to the hospital's right site protocol. She was then brought to the operating room where she was placed supine on the operative table. All bony prominences were adequately padded. The left leg was placed in a leg holder and the right leg was placed in a hemi- lithotomy position. A timeout was performed to verify the patient's name, date of birth, medical record number, correct site of surgery and correct procedure to be performed. It was also used to verify the patient had received antibiotics. All proper instruments, implants, and radiographic studies were available in the room. Once all in attendance were in agreement, the case began. The patient had a closed reduction performed using traction and internal rotation. The fracture position was confirmed on C-arm imaging in both the AP and lateral planes. Once adequate reduction was achieved, the patient was prepped and draped in sterile fashion. The C-arm images were used for incisional planning. A lateral incision was made  just superior to the tip of the greater trochanter and into the proximal femur, traversing the fracture.  This was overdrilled with a 15 mm proximal femoral  drill. A flexible  ball-tip guidewire was then placed down the shaft of the femur. Flexible reamers were then used up to a size 11 mm.  This allowed for easy placement of a long 9 mm Biomet AFFIXUS rod. The drill guide was placed through the AFFIXUS nail guide arm for the lag screw. A small stab incision was made along the lateral femur just below the greater trochanter. This allowed for positioning of the drill guide along the lateral cortex of the femur. A threaded guide pin was advanced through the drill guide across the fracture site and into the femoral head. A tip apex distance of less than 25 mm was achieved. The position of the guidepin was confirmed on AP and lateral C-arm images. A depth gauge was then used to measure the length of the lag screw. The guide pin was then overdrilled and the lag screw position by hand. Again, a tip apex distance less than 25 mm was achieved.   The set screw on top of the nail was then tightened and then loosened a quarter turn to allow for compression of the fracture site. During placement of the rod, it was appreciated how  comminuted the fracture actually was. The long rod lag screw, however, were able to stabilize the primary fracture fragments.   Finally, a distal interlocking screw was placed using a perfect circle technique. The hole was drilled using a freehand technique.  Once the distal interlocking screw was placed, its position was confirmed on AP and lateral C-arm images. Final images of the  entire intramedullary construct was performed. The fracture was well reduced and the hardware was in good position. All incisions were then copiously irrigated. The deep fascia was closed with 0 Vicryl, the subcutaneous tissue was closed with 2-0 Vicryl and the skin was approximated with staples.  At the proximal most  incision, the middle incision was closed with 2-0 Vicryl and skin staples in the distal interlocking screw was closed with staples alone. Dry sterile dressing was applied over the left lateral femur. The patient was then transferred gently to a hospital bed and brought to the PACU in stable condition. I was scrubbed and present for the entire case, and all sharp and instrument counts were correct at the conclusion of the case. I spoke with the family in the postoperative waiting room to let them know the case had gone without complication. The patient was stable in the recovery room.   ____________________________ Timoteo Gaul, MD klk:DT D: 03/22/2014 13:07:54 ET T: 03/22/2014 13:37:06 ET JOB#: [id number]  cc: Timoteo Gaul, MD, <Dictator> Timoteo Gaul MD ELECTRONICALLY SIGNED 03/26/2014 17:48

## 2014-08-18 NOTE — Consult Note (Signed)
PATIENT NAME:  [name] MR#:  [id number] DATE OF BIRTH:  09-Apr-1930  DATE OF CONSULTATION:  03/14/2014  CONSULTING PHYSICIAN:  Timoteo Gaul, MD  REASON FOR CONSULTATION:  Left subtrochanteric femur fracture.   HISTORY OF PRESENT ILLNESS:  [name] is an 81 year old female who sustained a mechanical fall at home. She explains that she was lying on her left side for approximately 2 hours before being brought to the [facility] Emergency Department. She had immediate pain in the left hip and has been unable to stand or ambulate following the injury. She denies any numbness or tingling. She was seen in the Emergency Room surrounded by her family.   PAST MEDICAL HISTORY:  Includes essential hypertension, hypothyroidism, unspecified hyperlipidemia, obstructive sleep apnea, history of B-cell lymphoma and myasthenia gravis.   SOCIAL HISTORY:  The patient denies alcohol, tobacco, or drug use.   ALLERGIES:  BETADINE, SULFA DRUGS, AND VALIUM.   HOME MEDICATIONS:  Include 81 mg of aspirin daily; atenolol 25 mg daily; pyridostigmine 30 mg p.o. t.i.d., which she self-discontinued approximately 2 weeks ago; levothyroxine 75 mcg p.o. daily; benazepril 40 mg p.o. daily; and multivitamin daily.   PHYSICAL EXAMINATION:  The patient has intact skin overlying the left hip. She is shortened and externally rotated. She can flex and extend her toes and has intact sensation to light touch throughout the left lower extremity. Her foot is well perfused, and she has palpable pedal pulses. Her skin is intact. Her thigh compartments are soft and compressible. There is no erythema, ecchymosis, or swelling over the left hip. She is neurovascularly intact on the right lower extremity. She is complaining of significant pain in the right shoulder, but her skin is intact. There is no erythema or ecchymosis. She has anterior swelling over the chest, but there is no crepitus to palpation of the clavicle. She has  a history of a humeral shaft fracture and has chronic pain in the right shoulder, which preceded this injury. The fall has only made it worse.   RADIOLOGY:  The patient's pelvis and left hip films demonstrate a fracture extending into the subtrochanteric area of the left femur. There is dissociation of the shaft of the greater trochanter and femoral head and neck. There is obvious displacement of the fracture. The fracture appears comminuted.   X-rays of the right shoulder demonstrate no acute fracture. There is a chronic midshaft humeral fracture with abundant callus formation. She has chronic advanced degenerative changes of the glenohumeral joint.   ASSESSMENT: 1.  Left proximal femur fracture of subtrochanteric variety.  2.  Right shoulder pain without evidence of fracture by plain film.   PLAN:  I explained to [name] and her family that she will require surgery to fix her proximal femur fracture. I recommended an intramedullary rod. I reviewed the x-rays with the patient and her family. They took cell phone pictures of the images. I examined the patient's right shoulder. She had limited range of motion and mild tenderness, but her skin was intact and distally she was neurovascularly intact in her hand. I have recommended intramedullary fixation and explained the surgery and the postoperative course for this. The patient will be n.p.o. after midnight. She is being admitted to the hospitalist service for preoperative evaluation. We will plan for surgery tomorrow as long as she is medically cleared.     ____________________________ Timoteo Gaul, MD klk:nb D: 03/15/2014 00:16:39 ET T: 03/15/2014 00:34:06 ET JOB#: [id number]  cc: Timoteo Gaul,  MD, <Dictator> Timoteo Gaul MD ELECTRONICALLY SIGNED 03/20/2014 10:06

## 2014-08-18 NOTE — Consult Note (Signed)
Pt with post op ileus with dilated cecum of 12cm.  She passed gas today and had bowel movement.  Will continue Dulcolax supp bid and give one fleets enema tonight.  She may be starting to improve with the ileus and I would continue current plan.  If hgb drops below 8 I would transfuse. Will follow with you, suggest daily x-rays until see definite strong improvement.  Electronic Signatures: Manya Silvas (MD)  (Signed on 23-Nov-15 19:18)  Authored  Last Updated: 23-Nov-15 19:18 by Manya Silvas (MD)

## 2014-08-18 NOTE — H&P (Signed)
PATIENT NAME:  [name], [name] MR#:  [id number] DATE OF BIRTH:  Mar 16, 1930  DATE OF ADMISSION:  03/14/2014  REFERRING PHYSICIAN: Verdia Kuba. Paduchowski, MD  PRIMARY CARE PHYSICIAN: Clayborn Bigness, MD   CHIEF COMPLAINT: Hip pain.  HISTORY OF PRESENT ILLNESS: An 81 year old Caucasian female with past medical history of hypertension, hyperlipidemia, hypothyroidism, as well as B-cell lymphoma and myasthenia gravis who is presenting after a fall. She suffered a mechanical fall at home. She was apparently lying on the floor for about 2 hours face down in total. She is unable to move given pain. She felt acute pain over her left hip, 10/10 intensity, described only as " pain," worse with movement. No relieving factors. Brought into the Emergency Department for further workup and evaluation and found to have a displaced proximal femur fracture extending to the lesser trochanter without dislocation. Also of note, she stopped taking her pyridostigmine about 2 weeks ago and she self discontinued that. Since that time she has had no ill effects or worsening weakness, as the patient or family would describe.  REVIEW OF SYSTEMS: CONSTITUTIONAL: Denies any fevers, chills, fatigue, weakness.  EYES: Denies blurred vision, double vision or eye pain.  EARS, NOSE, THROAT: Denies tinnitus, ear pain, hearing loss.  RESPIRATORY: Denies cough, wheeze, shortness of breath.  CARDIOVASCULAR: Denies chest pain, palpitations. Positive for edema which is chronic.  GASTROINTESTINAL: Denies nausea, vomiting, diarrhea, abdominal pain.  GENITOURINARY: Denies dysuria, hematuria.  ENDOCRINE: Denies nocturia or thyroid problems.  HEMATOLOGY AND LYMPHATIC: Denies easy bruising or bleeding.  SKIN: Denies rash or lesion.  MUSCULOSKELETAL: Positive for pain in the left hip as described above. She otherwise denies any pain in neck, back, or knees or further arthritic symptoms.  NEUROLOGIC: Denies any paralysis or paresthesias.   PSYCHIATRIC: Denies anxiety or depressive symptoms. Otherwise, full review of system report was negative.  PAST MEDICAL HISTORY: Essential hypertension, hypothyroidism, unspecified, hyperlipidemia, unspecified obstructive sleep apnea, noncompliant with BiPAP or CPAP therapy, history of B-cell lymphoma as well as recently diagnosed myasthenia gravis about 1 year ago.   SOCIAL HISTORY: No alcohol, tobacco or drug usage. She has a shuffling gait at baseline.   FAMILY HISTORY: Positive for diabetes as well as coronary artery disease.   ALLERGIES: BETADINE, SULFA DRUGS AS WELL AS VALIUM.   HOME MEDICATIONS: Include aspirin 81 mg p.o. daily, atenolol 25 mg p.o. daily, pyridostigmine 30 mg p.o. 3 times a day which she apparently self discontinued about 2 weeks ago, levothyroxine 75 mcg p.o. daily, benazepril 40 mg p.o. daily, multivitamin 1 tablet p.o. daily.   PHYSICAL EXAMINATION:  VITAL SIGNS: Temperature 97.9, heart rate 90, respirations 18, blood pressure 131/68, saturating 99% on room air. Weight 90.7 kg, BMI of 37.8.  GENERAL: Well-nourished, well-developed, Caucasian female, currently in minimal distress given that hip pain.  HEAD: Normocephalic, atraumatic.  EYES: Pupils equal, reactive to light. Extraocular muscles intact. No scleral icterus.  MOUTH: Dry mucosal membrane. Dentition intact. No abscess noted.  EARS, NOSE, THROAT: Clear without exudates. No external lesions.  NECK: Supple. No thyromegaly. No nodules. No JVD.  PULMONARY: Clear to auscultation bilaterally without wheezes, rubs or rhonchi. No use of accessory muscles. Poor respiratory effort. Chest nontender to palpation. CARDIOVASCULAR: S1, S2, regular rate and rhythm. No murmurs, rubs or gallops. No edema. Trace pedal edema to the shins bilaterally. Pedal pulses 2+ bilaterally.  GASTROINTESTINAL: Soft, nontender, nondistended. No masses. Positive bowel sounds. No hepatosplenomegaly.  MUSCULOSKELETAL: No swelling, clubbing or  edema. Limited range of motion in  the left lower extremity given fracture; however, she does have distal motion.  NEUROLOGIC: Cranial nerves II through XII intact. No gross focal neurological deficits. Sensation intact. Reflexes intact.  SKIN: No ulceration, lesions, rashes or cyanosis. Skin warm, dry. Turgor intact.  PSYCHIATRIC: Mood and affect within normal range. Awake, alert and oriented x 3. Insight and judgment intact.   LABORATORY DATA: EKG performed reveals normal sinus rhythm. No ST or T wave abnormalities. She has x-ray of the left hip which reveals displaced proximal femur fracture extending to the lesser intertrochanteric without dislocation. Chest x-ray performed reveals blunting of the left costophrenic angle representing likely atelectasis. X-ray of the right shoulder which reveals a right anterior shoulder suspected subluxation without dislocation.   Remainder of laboratory data: Sodium 140, potassium 5.1, chloride 106, bicarb of 31, BUN 27, creatinine 1.45, glucose 145. LFTs: Albumin of 2.9; otherwise within normal limits. WBC 18.4, hemoglobin 13.4, platelets of 188,000.   ASSESSMENT AND PLAN: An 81 year old Caucasian female with past medical history of hypertension, hyperlipidemia, B-cell lymphoma, myasthenia gravis who is presenting with mechanical fall, found to have left hip fracture.   1.  Preoperative evaluation for open reduction/internal fixation of left displaced proximal femur fracture. She should be considered a moderate risk for moderate risk surgery from a cardiac standpoint. She did not require any further testing prior to surgery. Her METS should be considered less than 4 given generalized dysmobility. She does not have the overt symptoms of frank heart failure, arrhythmias or valvular dysfunction at this time. As far as her medications are concerned, would hold ACE inhibitor preoperatively, which can be restarted postoperatively, to help decrease the likelihood of  intraoperative hypotension. Also hold pyridostigmine preoperatively and can restart postoperatively when hemodynamically stable. Given her myasthenia gravis, should avoid neuromuscular blocking agents as best as possible for anesthesia, as these can lead to prolonged postoperative weakness as well as ventilatory dependence. Provide pain medications as required as well as initiate bowel regimen. 2.  Acute kidney injury, likely prerenal in etiology. Provide IV fluid hydration. Follow urine output and renal function. We will check a CK level as well, as she states that she was lying down for 2 hours.  3.  Hypertension. Hold ACE inhibitors preoperatively as stated above to help avoid intraoperative hypotension. It can be restarted postoperatively. Continue her beta blockade.  4.  Myasthenia gravis. She actually self discontinued pyridostigmine about 2 weeks ago. Has had no ill side effects thus far. Continue to hold preoperatively but can be restarted postoperatively.  5.  Venous thromboembolism prophylaxis with sequential compression devices.   CODE STATUS: The patient a full code.   TIME SPENT: Forty-five minutes.    ____________________________ Aaron Mose. Trinidy Masterson, MD dkh:TT D: 03/14/2014 22:47:37 ET T: 03/14/2014 23:06:14 ET JOB#: [id number]  cc: Aaron Mose. Yoshi Mancillas, MD, <Dictator> Kary Sugrue Woodfin Ganja MD ELECTRONICALLY SIGNED 03/15/2014 4:02

## 2014-08-26 NOTE — H&P (Signed)
PATIENT NAME:  [name] MR#:  [id number] DATE OF BIRTH:  06-06-1929  DATE OF ADMISSION:  07/06/2014  ADMITTING PHYSICIAN: Gladstone Lighter, MD  PRIMARY CARE PHYSICIAN: Clayborn Bigness, MD  CHIEF COMPLAINT: Confusion and fever.   HISTORY OF PRESENT ILLNESS: Ms. [name] is an 81 year old elderly Caucasian female with past medical history significant for hypertension, hypothyroidism, sleep apnea not on CPAP, remote history of lymphoma, and myasthenia gravis, not on any treatment at this time, was brought from [facility] facility secondary to fever and confusion. The patient is confused at this time and her daughter at bedside provides most of the history. The patient has been clinically worsening gradually since her hip surgery in November 2015. She was at rehab at that point and was discharged to the assisted living facility. On good days she can use a walker to ambulate, but mentally she is pretty sharp and her appetite has been good. Two days ago they noticed that the patient started to have some cough and dyspnea and yesterday all day she was confused and did not eat her lunch or supper. This morning when the daughter went to check on her she had a stared look, not speaking and appeared very confused. Family thought initially that she had a stroke and brought her to the hospital. Now she is able to speak although she is completely confused at this time. No focal neurological deficits are noted. Urine looks dirty indicating of UTI. The patient also has been having some cough and wheezing on exam indicating she probably has at least acute bronchitis.  PAST MEDICAL HISTORY:  1.  Hypertension.  2.  Hypothyroidism.  3.  Hyperlipidemia.  4.  Obstructive sleep apnea, not on CPAP. 5.  B-cell lymphoma.  6.  Myasthenia gravis.   PAST SURGICAL HISTORY: 1.  Left hip surgery.  2.  Cholecystectomy.  3.  Ventral hernia repair.   ALLERGIES TO MEDICATIONS: BETADINE, SULFA DRUGS,  VALIUM.  CURRENT HOME MEDICATIONS:  1.  Aspirin 81 mg p.o. daily.  2.  ProAir inhaler 2 puffs q. 6 hours p.r.n.  3.  Tylenol 650 mg q. 6 hours p.r.n. for pain.  4.  Aleve 220 mg p.o. b.i.d.  5.  Atenolol 25 mg p.o. daily.  6.  Calcium with vitamin D 500 mg/200 international units 1 tablet p.o. b.i.d.  7.  Detrol LA 2 mg p.o. at bedtime.  8.  Colace 100 mg p.o. b.i.d.  9.  Ferrous sulfate 325 mg p.o. b.i.d.  10.  5% lidocaine topical ointment to effected area as needed.  11.  Lubricant eyedrops 1 drop each eye q. 4 hours as needed.  12.  MiraLax powder p.r.n. for constipation.  13.  Multivitamin 1 tablet p.o. daily.  14.  Oxycodone 5 mg q. 6 hours p.r.n. for pain.  15.  Synthroid 75 mcg p.o. daily.  16.  Trazodone 75 mg p.o. at bedtime.   SOCIAL HISTORY: Has been a resident at [facility] assisted living facility. Uses a wheelchair for ambulation at baseline. No smoking or alcohol use.   FAMILY HISTORY Significant for heart disease and diabetes.   REVIEW OF SYSTEMS: Difficult to be obtained secondary to the patient's confusion at this time.   PHYSICAL EXAMINATION: VITAL SIGNS: Temperature 99.4 degrees Fahrenheit, pulse 84, respirations 17, blood pressure 141/123, pulse ox 96% on room air.  GENERAL: Heavily built, well-nourished female lying in bed, not in any acute distress.  HEENT: Normocephalic, atraumatic. Pupils equal, round and reacting to  light. Anicteric sclerae. Extraocular movements intact. Oropharynx clear without erythema, mass, or exudates.  NECK: Supple. No thyromegaly, JVD or carotid bruit. No lymphadenopathy.  LUNGS: Moving air bilaterally. Coarse wheeze heard diffusely bilaterally, worse on the left side. No use of accessory muscles for breathing. No rales or rhonchi heard.  CARDIOVASCULAR: S1, S2 regular rate and rhythm, 3/6 systolic murmur heard. No rubs or gallops.  ABDOMEN: Obese, soft, nontender, nondistended. No hepatosplenomegaly. Normal bowel sounds.   EXTREMITIES: No pedal edema. No clubbing or cyanosis. 2+ dorsalis pedis pulses palpable bilaterally.  SKIN: No acne, rash or lesions.  LYMPHATICS: No cervical lymphadenopathy.  NEUROLOGIC: Cranial nerves seem to be intact, following commands, able to move all 4 extremities although slightly weak on the left lower extremity, likely since her left hip surgery.  PSYCHIATRIC: The patient is awake, alert, and oriented x1 to 2.   DIAGNOSTIC DATA: WBC 6.8, hemoglobin 13, hematocrit 40.6, platelet count 165,000.   Sodium 137, potassium 4, chloride 97, bicarb 31, BUN 23, creatinine 1.15, glucose 89, and calcium 9.3.   ALT 18, AST 26, alk phos 78, total bilirubin 0.4 and albumin 3.1.   Urinalysis was nitrite positive, leukocyte esterase positive, 2+ WBCs and 86 bacteria.  Troponin is negative.   CT of the head without contrast showing no acute intracranial findings, chronic microvascular white matter ischemic changes noted.   Chest x-ray showing pericardial silhouette is enlarged. No airspace opacity, effusion or edema noted.   EKG showing normal sinus rhythm. No acute ST-T wave abnormalities.   ASSESSMENT AND PLAN: An 81 year old female with past medical history significant for hypertension, sleep apnea, and hypothyroidism admitted from assisted living facility secondary to confusion and fever.  1.  Early sepsis from urinary tract infection and acute bronchitis. Ordered blood cultures and also urine cultures. Continue Rocephin and azithromycin. We will give 1 dose steroids and DuoNebs round-the-clock. Continue to encourage incentive spirometry. Physical therapy consultation. 2.  Hypertension. Continue home medications.  3.  Hypothyroidism. On Synthroid.  4.  Deep vein thrombosis prophylaxis with subcutaneous heparin.   CODE STATUS: FULL.  TIME SPENT ON ADMISSION: 50 minutes. ____________________________ Gladstone Lighter, MD rk:sb D: 07/06/2014 11:32:31 ET T: 07/06/2014 11:50:57  ET JOB#: [id number]  cc: Gladstone Lighter, MD, <Dictator> Lavera Guise, MD Gladstone Lighter MD ELECTRONICALLY SIGNED 07/12/2014 18:30

## 2014-08-26 NOTE — Consult Note (Signed)
General Aspect 81 year old female, with a recent admission in November 2015 with fracture of her femur and complicated by severe ileus, living in the nursing home, on pain medications, presenting with severe constipation for the last week, presenting to the ER with rectal pain.Cardiology was consulted for elevated troponin.  At the nursing home/rehab, the patient was given laxative with good improvement, had multiple episodes of bowel movements several days ago. Since then patient has been having severe pain in the rectal area and right lower quadrant area. She denies any SOB, no chest pain. She has been sedentary, walks with a walker adn assistance, infact needs a wheelchair.   She came to the Emergency Department for ABD and rectal pain. Workup in the Emergency Department is unremarkable except for elevation of the troponin of 0.08.   In the ER,  The patient had CT abdomen and pelvis with contrast, showed diverticulosis without diverticulitis, otherwise, no other obvious acute findings were noted. She does not have much of an appetite this AM, reports having continued rectal pain.   Present Illness . SOCIAL HISTORY: No history of smoking, drinking alcohol or using illicit drugs. Currently living in a skilled nursing facility, still needing a lot of assistance with ambulation.   FAMILY HISTORY: Positive for diabetes mellitus and coronary artery disease.   Physical Exam:  GEN well developed, well nourished, no acute distress   HEENT hearing intact to voice, moist oral mucosa   NECK supple   RESP normal resp effort  clear BS   CARD Regular rate and rhythm  Normal, S1, S2  No murmur   ABD denies tenderness   LYMPH negative neck   EXTR negative edema   SKIN normal to palpation   NEURO motor/sensory function intact   PSYCH alert, A+O to time, place, person, good insight   Review of Systems:  Subjective/Chief Complaint rectal pain, ABD pain   General: Fatigue  Weakness   anorexia   Skin: No Complaints   ENT: No Complaints   Eyes: No Complaints   Neck: No Complaints   Respiratory: No Complaints   Cardiovascular: No Complaints   Gastrointestinal: Constipation  rectal pain   Genitourinary: No Complaints   Vascular: No Complaints   Musculoskeletal: No Complaints   Neurologic: No Complaints   Hematologic: No Complaints   Endocrine: No Complaints   Psychiatric: No Complaints   Medications/Allergies Reviewed Medications/Allergies reviewed   Family & Social History:  Family and Social History:  Family History Hypertension       lymphoma:    htn:    tonsillectomy:    abdominal hernia repair: 30 years ago   choleycystectomy:          Admit Diagnosis:   ELEVATED TROPONIN LEVEL: Onset Date: 18-May-2014, Status: Active, Description: ELEVATED TROPONIN LEVEL  Home Medications: Medication Instructions Status  atenolol 25 mg oral tablet 1 tab(s) orally once a day Active  acetaminophen-HYDROcodone 325 mg-5 mg oral tablet 1 tab(s) orally every 4 hours, As Needed - for Pain Active  nystatin 100,000 units/g topical powder Apply topically to affected area 3 times a day Active  acetaminophen 650 mg oral tablet, extended release 1 tab(s) orally every 8 hours, As Needed - for Pain Active  aspirin 81 mg oral tablet, chewable 1 tab(s) orally once a day Active  calcium-vitamin D 500 mg-200 intl units oral tablet 1 tab(s) orally 2 times a day Active  docusate sodium 100 mg oral tablet 1 tab(s) orally 2 times a day Active  ferrous  sulfate 325 mg (65 mg elemental iron) oral tablet 1 tab(s) orally 2 times a day Active  tamsulosin 0.4 mg oral capsule 1 cap(s) orally once a day Active  multivitamin 1 tab(s) orally once a day Active  ProAir HFA CFC free 90 mcg/inh inhalation aerosol 2 puff(s) inhaled every 6 hours, As Needed - for Shortness of Breath Active  Synthroid 75 mcg (0.075 mg) oral tablet 1 tab(s) orally once a day Active  traZODone 50 mg  oral tablet 1 tab(s) orally once a day (at bedtime) Active   Lab Results:  Hepatic:  21-Jan-16 18:34   Bilirubin, Total 0.3  Alkaline Phosphatase 82 (46-116 NOTE: New Reference Range 11/14/13)  SGPT (ALT) 24 (14-63 NOTE: New Reference Range 11/14/13)  SGOT (AST)  39  Total Protein, Serum 6.5  Albumin, Serum  2.6  Routine Chem:  21-Jan-16 18:34   Result Comment POTASSIUM/CREATININE/BUN - Slight hemolysis, interpret results with AST/TOTAL PROTEIN - caution. TROPONIN - RESULTS VERIFIED BY REPEAT TESTING.  - C/T [facility] 05-17-14./[facility]  - READ-BACK PROCESS PERFORMED.  Result(s) reported on 17 May 2014 at 07:14PM.  Glucose, Serum 99  BUN 15  Creatinine (comp) 1.13  Sodium, Serum 137  Potassium, Serum 4.4  Chloride, Serum 100  CO2, Serum 30  Calcium (Total), Serum 8.7  Osmolality (calc) 275  eGFR (African American)  59  eGFR (Non-African American)  49 (eGFR values <24mL/min/1.73 m2 may be an indication of chronic kidney disease (CKD). Calculated eGFR, using the MRDR Study equation, is useful in  patients with stable renal function. The eGFR calculation will not be reliable in acutely ill patients when serum creatinine is changing rapidly. It is not useful in patients on dialysis. The eGFR calculation may not be applicable to patients at the low and high extremes of body sizes, pregnant women, and vegetarians.)  Anion Gap 7  Lipase 90 (Result(s) reported on 17 May 2014 at 07:26PM.)  Cardiac:  21-Jan-16 18:34   CPK-MB, Serum 0.8 (Result(s) reported on 18 May 2014 at 12:36AM.)  Troponin I  0.08 (0.00-0.05 0.05 ng/mL or less: NEGATIVE  Repeat testing in 3-6 hrs  if clinically indicated. >0.05 ng/mL: POTENTIAL  MYOCARDIAL INJURY. Repeat  testing in 3-6 hrs if  clinically indicated. NOTE: An increase or decrease  of 30% or more on serial  testing suggests a  clinically important change)    23:18   Troponin I  0.06 (0.00-0.05 0.05 ng/mL or less: NEGATIVE   Repeat testing in 3-6 hrs  if clinically indicated. >0.05 ng/mL: POTENTIAL  MYOCARDIALINJURY. Repeat  testing in 3-6 hrs if  clinically indicated. NOTE: An increase or decrease  of 30% or more on serial  testing suggests a  clinically important change)  22-Jan-16 03:49   Troponin I  0.06 (0.00-0.05 0.05 ng/mL or less: NEGATIVE  Repeat testing in 3-6 hrs  if clinically indicated. >0.05 ng/mL: POTENTIAL  MYOCARDIAL INJURY. Repeat  testing in 3-6 hrs if  clinically indicated. NOTE: An increase or decrease  of 30% or more on serial  testing suggests a  clinically important change)  Routine Hem:  21-Jan-16 18:34   WBC (CBC) 9.8  RBC (CBC) 4.02  Hemoglobin (CBC)  11.6  Hematocrit (CBC) 36.6  Platelet Count (CBC) 242  MCV 91  MCH 28.9  MCHC  31.7  RDW  15.8  Neutrophil % 63.0  Lymphocyte % 26.3  Monocyte % 8.3  Eosinophil % 1.6  Basophil % 0.8  Neutrophil # 6.2  Lymphocyte # 2.6  Monocyte # 0.8  Eosinophil # 0.2  Basophil # 0.1 (Result(s) reported on 17 May 2014 at 07:14PM.)   EKG:  Interpretation EKG shows NSR with no significant ST or T wave changes   Radiology Results: CT:    21-Jan-16 20:20, CT Abdomen and Pelvis With Contrast  CT Abdomen and Pelvis With Contrast   REASON FOR EXAM:    (1) ruq and rlq abd pain; (2) ruq and rlq abd pain  COMMENTS:       PROCEDURE: CT  - CT ABDOMEN / PELVIS  W  - May 17 2014  8:20PM     CLINICAL DATA:  Constipation rectal pain for several days.    EXAM:  CT ABDOMEN AND PELVIS WITHCONTRAST    TECHNIQUE:  Multidetector CT imaging of the abdomen and pelvis was performed  using the standard protocol following bolus administration of  intravenous contrast.  CONTRAST:  80 cc Omnipaque 300.    COMPARISON:  CT abdomen and pelvis 04/05/2014 and 03/23/2014.    FINDINGS:  Small hiatal hernia is identified. No pleural or pericardial  effusion. Lung bases are clear.    The patient status post cholecystectomy. The liver, spleen,  adrenal  glands, pancreas and kidneys are unremarkable. Uterus, adnexa and  urinary bladder appear normal. No perirectal abscess is identified.  There is sigmoid diverticulosis without diverticulitis. The appendix  is not visualized and likely has been removed. The small bowel is  unremarkable. There is a aortoiliac atherosclerosis without  aneurysm. Retroaortic left renal vein is incidentally noted.  L1 and L2 compression fractures are again seen. There is no new  fracture. Partial visualization pressure there is partial  visualization of a left hip fracture with hardware in place.     IMPRESSION:  No acute finding abdomen or pelvis.    Diverticulosis without diverticulitis.    Atherosclerosis.    Small hiatal hernia.      Electronically Signed    By: Inge Rise M.D.    On: 05/17/2014 20:41         Verified By: Ramond Dial, M.D.,    Sulfa drugs: Rash  Valium: Rash  Betadine: Unknown  Vital Signs/Nurse's Notes: **Vital Signs.:   22-Jan-16 04:08  Vital Signs Type Routine  Temperature Temperature (F) 97.9  Celsius 36.6  Temperature Source oral  Pulse Pulse 82  Respirations Respirations 20  Systolic BP Systolic BP 893  Diastolic BP (mmHg) Diastolic BP (mmHg) 59  Mean BP 72  Pulse Ox % Pulse Ox % 94  Pulse Ox Activity Level  At rest  Oxygen Delivery Room Air/ 21 %    Impression 81 year old Caucasian female with past medical history of hypertension, hyperlipidemia, B-cell lymphoma, myasthenia gravis mechanical fall 11/15, left hip fracture, s/p ORIF, ileus post op, presenting with rectal pain, constipation, elevated troponin.  1) Elevated troponin minimal elevation, 0.08 and lower in the setting of no sx of SOB, chest pain. EKG normal Does not appear to have active cardiac issues, also patient requesting no further workup at this time. ok to d/c from a cardiac perspective    2)  left proximal femur fracture. status post ORIF At rehab, has not  regained full mobility, needs wheelchair  3) Rectal pain, hx of constipation 1 weeks ago, possible hemmorrhoids May Need rectal exam regular laxatives as she is on pain meds chronically  4) Hypertension.  continue atenelol  5) H/o  Pulm vascular congestion Appears euvolemic  6)   Myasthenia gravis.  She was  previously on pyridostigmine   outpt neurology follow-up Continue incentive spirometry   Electronic Signatures: Ida Rogue (MD)  (Signed 22-Jan-16 08:55)  Authored: General Aspect/Present Illness, History and Physical Exam, Review of System, Family & Social History, Past Medical History, Health Issues, Home Medications, Labs, EKG , Radiology, Allergies, Vital Signs/Nurse's Notes, Impression/Plan   Last Updated: 22-Jan-16 08:55 by Ida Rogue (MD)

## 2014-08-26 NOTE — Discharge Summary (Signed)
PATIENT NAME:  [name], GORNIAK MR#:  [id number] DATE OF BIRTH:  [birth year]  DATE OF ADMISSION:  05/17/2014 DATE OF DISCHARGE:  05/18/2014  PRIMARY CARE PHYSICIAN: Clayborn Bigness, MD  DISCHARGE DIAGNOSES:  1.  Mild elevated troponin. 2.  Constipation. 3.  Rectal pain with inflamed hemorrhoid. 4.  Hypertension. 5.  Severe debility.   CONDITION: Stable.   CODE STATUS: FULL code.   HOME MEDICATIONS: Please refer to the medication reconciliation list.   DISCHARGE DIET: Low-sodium.  DISCHARGE ACTIVITY: As tolerated.   FOLLOW-UP CARE: Follow up with PCP and Dr. Rockey Situ within 1 to 2 weeks.  REASON FOR ADMISSION: Abdominal pain.   HOSPITAL COURSE: The patient is an 81 year old Caucasian female with a history of hypertension and hypothyroidism presented to the ED with abdominal pain for 2 days, which is in the right lower quadrant area. In addition, the patient has severe pain in rectal area. The patient did not have any bowel movement. For detailed history and physical examination, please refer to the admission note dictated by Dr. Lunette Stands. On admission date, the patient was noted to have elevated troponin at 0.08. CAT scan of abdomen and pelvis with contrast showed diverticulosis without diverticulitis.  1.  Mildly elevated troponin. The patient has no symptoms at all. No chest pain, palpitation, or shortness of breath. The patient's second and third troponin were 0.06 and 0.06. Dr. Rockey Situ saw the patient, but the patient does not want any work-up.  2.  For rectal pain, which is possibly due to inflamed hemorrhoid, the patient has been treated with Anusol suppository. Rectal pain is better.  3.  Hypertension has been controlled with hypertension medication. 4.  For constipation, the patient's Percocet was discontinued. The patient has been treated with stool softener.   The patient has had no complaints. Vital signs are stable. She is clinically stable and will be discharged back to a nursing  home today. I discussed the patient's discharge plan with the patient, the patient's daughter, nurse, case manager and Dr. Rockey Situ.   TIME SPENT: About 36 minutes.   ____________________________ Demetrios Loll, MD qc:sb D: 05/18/2014 14:21:06 ET T: 05/18/2014 14:52:37 ET JOB#: [id number]  cc: Demetrios Loll, MD, <Dictator> Demetrios Loll MD ELECTRONICALLY SIGNED 05/18/2014 18:34

## 2014-08-26 NOTE — H&P (Signed)
PATIENT NAME:  [name], BEECHY MR#:  [id number] DATE OF BIRTH:  [birth year]  DATE OF ADMISSION:  05/17/2014  PRIMARY CARE PHYSICIAN: Lavera Guise, MD   REFERRING PHYSICIAN: Larae Grooms, MD   CHIEF COMPLAINT: Abdominal pain.   HISTORY OF PRESENT ILLNESS: [name], an 81 year old female, with a recent admission in November 2015 with fracture of her femur and complicated by severe ileus, has been living in the nursing home. The patient is chronically on pain medications, gets Percocet every 4 as needed. The patient has been having severe constipation for the last 1 week. The patient was given laxative with good improvement, had multiple episodes of bowel movements 2 days back. Since then patient has been having severe pain in the rectal area and right lower quadrant area. The patient did not have any other bowel movements, did not have any blood in the stool, did not have any fever, does not have any mucous in the blood. Concerning this, came to the Emergency Department. Workup in the Emergency Department is unremarkable except for mild elevation of the troponin of 0.08. Concerning this, Emergency Department physician requested for further followup concerning about patient's elevated troponins. The patient denied having any chest pain, shortness of breath. The patient also had CT abdomen and pelvis with contrast, showed diverticulosis without diverticulitis, otherwise, no other obvious acute findings were noted.   PAST MEDICAL HISTORY:  1.  Hypertension.  2.  Hypothyroidism.  3.  Hyperlipidemia.  4.  Obstructive sleep apnea.  5.  History of B-cell lymphoma.  6.  Myasthenia gravis.   ALLERGIES:   1.  BETADINE.   2.  SULFA.   3.  VALIUM.    HOME MEDICATIONS:  1.  Trazodone 50 mg once a day.  2.  Tamsulosin 0.4 mg once a day.  3.  Synthroid 75 mcg once a day.  4.  ProAir 2 puffs every 6 hours as needed.  5.  Nystatin 100,000 units .  6.  Multivitamin 1 tablet daily.  7.  Ferrous  sulfate 325 mg 2 times a day.  8.  Docusate sodium 100 mg 2 times a day.  9.  Calcium with vitamin D 2 times a day.  10.  Atenolol 25 mg daily.  11.  Percocet 5/325 mg every 4 hours as needed.   SOCIAL HISTORY: No history of smoking, drinking alcohol or using illicit drugs. Currently living in a skilled nursing facility, still needing a lot of assistance with ambulation.   FAMILY HISTORY: Positive for diabetes mellitus and coronary artery disease.   REVIEW OF SYSTEMS:  CONSTITUTIONAL: Experiencing generalized weakness.  EYES: No change in lotion.  EARS, NOSE, AND THROAT: No change in hearing.  RESPIRATORY: No cough, shortness of breath.  CARDIOVASCULAR: No chest pain, palpations.  GASTROINTESTINAL: No nausea, vomiting, has pain in the rectal area in the right lower quadrant.  NEUROLOGICAL: No weakness or numbness in any part of the body.  SKIN: No rash or lesions.  MUSCULOSKELETAL: Good range of motion in all extremities.   PHYSICAL EXAMINATION:  GENERAL: This is a well-built and well-nourished obese female laying down in the bed not in distress.  VITAL SIGNS: Temperature 98, pulse 87, blood pressure 125/61, respiratory rate of 20, oxygen saturation is 96% on room air.  HEENT: Head normocephalic, atraumatic. There is no scleral icterus. Conjunctivae normal. Pupils equal and react to light. Mucous membranes moist. No pharyngeal erythema.  NECK: Supple. No lymphadenopathy. No JVD. No carotid bruit.  CHEST: Has no focal  tenderness.  LUNGS: Bilateral clear to auscultation.  HEART: S1, S2 regular. No murmurs are heard.  ABDOMEN: Bowel sounds plus. Soft, nontender, nondistended. No hepatosplenomegaly. Rectal  RECTAL: External hemorrhoids significantly inflamed, severe tenderness to do any rectal examination. No obvious bleeding is noted.  EXTREMITIES: No pedal edema. Pulses 2+.  NEUROLOGIC: The patient is alert, oriented to place, person, and time. Cranial nerves II through XII intact.  Motor 5/5 in upper and lower extremities.   LABORATORY DATA: UA: Two plus leukocyte esterase, WBC of 13. Chest x-ray, 1 view, portable: No acute cardiopulmonary disease. CT abdomen and pelvis: Diverticulosis. CBC, CMP are completely within normal limits. Troponin 0.08 .   ASSESSMENT AND PLAN: [name], 81 year old, comes with more rectal pain and right lower quadrant pain, is found to have mild elevation of the troponins.  1.  Mild elevation of the troponins. We will continue to cycle cardiac enzymes x 3. Admit the patient to a monitored bed. If negative, the patient could be discharged home.  2.  Rectal pain.  Inflamed hemorrhoids. Keep the patient on Anusol suppositories.  3.  Hypertension. Continue the home medications.  4.  Constipation. Hold all sedatives. Increase the dose of the stool softeners and followup.  5.  Hypothyroidism. Continue Synthroid.  6.  Severe debility. Continue with physical therapy, occupational therapy.  7.  Hypertension. Continue with home medications.  8.  Keep the patient on deep vein thrombosis prophylaxis with Lovenox.   TIME SPENT: 50 minutes.   ____________________________ Monica Becton, MD pv:AT D: 05/17/2014 23:30:22 ET T: 05/18/2014 00:12:57 ET JOB#: [id number]  cc: Monica Becton, MD, <Dictator> Lavera Guise, MD [place] Lexine Jaspers MD ELECTRONICALLY SIGNED 05/18/2014 21:32

## 2014-08-26 NOTE — Discharge Summary (Signed)
PATIENT NAME:  [name], [name] MR#:  [id number] DATE OF BIRTH:  1930/02/09  DATE OF ADMISSION:  07/06/2014 DATE OF DISCHARGE:  07/11/2014  ADMITTING PHYSICIAN: Gladstone Lighter, MD   DISCHARGING PHYSICIAN: Gladstone Lighter, M.D.   PRIMARY CARE PHYSICIAN: Dr. Clayborn Bigness   CONSULTATIONS IN THE HOSPITAL: None.   DISCHARGE DIAGNOSES:  1. Sepsis.  2. Metabolic encephalopathy.  3. Urinary tract infection.  4. Acute bronchitis.  5. Obstructive sleep apnea, not on CPAP.  6. Hypertension.  7. Hypothyroidism.  8. Hyperlipidemia.  9. Myasthenia gravis.  10. B-cell lymphoma.   DISCHARGE HOME MEDICATIONS:  1. Atenolol 25 mg p.o. daily.  2. Aspirin 81 mg p.o. daily.  3. Calcium vitamin D 500 mg/200 international units 1 tablet p.o. b.i.d.  4. Colace 100 mg p.o. b.i.d.  5. Ferrous sulfate 325 mg p.o. b.i.d.  6. Multivitamin 1 tablet p.o. daily.  7. ProAir inhaler 2 puffs every 6 hours p.r.n. for shortness of breath.  8. Synthroid 75 mcg p.o. daily.  9. MiraLax powder p.r.n. for constipation.  10. Detrol LA 2 mg p.o. at bedtime.  11. Trazodone 50 mg 1-1/2 tablets p.o. at bedtime.  12. Aleve sodium 220 mg p.o. b.i.d.  13. 5%  Lidoderm lidocaine ointment every 4 hours to rectal area as needed for pain.  14. Ophthalmic lubricant eyedrops 1 drop each eye every 4 hours as needed.  15. Tylenol 650 mg 1 tablet every 4 hours p.r.n. for pain.  16. Oxycodone 5 mg every 8 hours p.r.n. for pain.  17. Cepacol lozenges every 2 hours as needed for cough, sore throat.  18. Levaquin 500 mg p.o. daily for 4 days.  19. Tessalon Perles 100 mg every 6 hours for 5 days.  20. Prednisone taper over the next 3 days.   DISCHARGE DIET: Low sodium diet.   DISCHARGE ACTIVITY: As tolerated.    FOLLOWUP INSTRUCTIONS:  1. Home health nursing and physical therapy.  2. PCP followup in 1 week.   LABORATORY AND IMAGING STUDIES PRIOR TO DISCHARGE: Sodium 138, potassium 4.0, chloride 100, bicarbonate 29, BUN 19,  creatinine 0.91, glucose of 99, and calcium of 8.3.   WBC 5.4, hemoglobin 11.9, hematocrit 36.3, platelet count is 152. Blood cultures are negative x2.   Chest x-ray on admission showing cardiopericardial silhoutte  is enlarged without any overt edema, otherwise clear lung fields. CT of the head without contrast showing periventricular chronic white matter ischemic changes, but no acute abnormalities,   INR is 1.0 within normal limits. UA with 2+ leukocyte esterase, nitrite positive, WBCs 86 and 2+ bacteria. Unfortunately, urine cultures were not sent on admission   BRIEF HOSPITAL COURSE: Ms. [name] is an 81 year old, elderly Caucasian female, with past medical history significant for hypertension, hyperlipidemia, B-cell lymphoma finished treatment and history of myasthenia gravis, not on any treatment, who presents from [facility] assisted [facility] facility secondary to altered mental status and cough.   1. Sepsis causing metabolic encephalopathy. Likely source urinary tract infection and also has acute bronchitis. Chest x-ray with no pneumonia, but the patient had significant wheezing and productive on admission. She was started on IV Rocephin and azithromycin. Blood cultures were negative. Urine cultures were unfortunately not sent and she clinically has been improving with IV antibiotics. Her cough is improved with nebulizer treatments. She was also started on some steroids and is on taper of prednisone. Incentive spirometry has been increased. She is off the oxygen and now and is being discharged on inhalers, prednisone taper and  antibiotics home. Her mental status is back to her baseline, which is alert and oriented. 2. Hypertension. Continue her home medications.  3. Hypothyroidism, on Synthroid.  4. Chronic pain, bedbound status. Uses walker at baseline. Has had recent left hip surgery to less than 6 months ago. Continue her home pain medications.  5. Her course has been otherwise  uneventful in the hospital.   DISCHARGE CONDITION: Stable.   DISCHARGE DISPOSITION: Back to assisted living with home health.   CODE STATUS: DO NOT RESUSCITATE.   TIME SPENT ON DISCHARGE: 45 minutes.  ____________________________ Gladstone Lighter, MD rk:ap D: 07/11/2014 15:34:29 ET T: 07/11/2014 19:36:54 ET JOB#: [id number]  cc: Gladstone Lighter, MD, <Dictator> unknown cc Gladstone Lighter MD ELECTRONICALLY SIGNED 07/12/2014 18:36

## 2014-08-31 ENCOUNTER — Ambulatory Visit: Payer: Medicare Other | Admitting: Neurology

## 2014-09-11 ENCOUNTER — Emergency Department: Payer: PPO

## 2014-09-11 ENCOUNTER — Inpatient Hospital Stay
Admission: EM | Admit: 2014-09-11 | Discharge: 2014-09-20 | DRG: 071 | Disposition: A | Discharge status: Skilled Nursing Facility | Payer: PPO | Attending: Internal Medicine | Admitting: Internal Medicine

## 2014-09-11 ENCOUNTER — Encounter: Payer: Self-pay | Admitting: Emergency Medicine

## 2014-09-11 DIAGNOSIS — Z7401 Bed confinement status: Secondary | ICD-10-CM

## 2014-09-11 DIAGNOSIS — Z79891 Long term (current) use of opiate analgesic: Secondary | ICD-10-CM

## 2014-09-11 DIAGNOSIS — G4733 Obstructive sleep apnea (adult) (pediatric): Secondary | ICD-10-CM | POA: Diagnosis present

## 2014-09-11 DIAGNOSIS — G9340 Encephalopathy, unspecified: Principal | ICD-10-CM | POA: Diagnosis present

## 2014-09-11 DIAGNOSIS — B349 Viral infection, unspecified: Secondary | ICD-10-CM | POA: Diagnosis present

## 2014-09-11 DIAGNOSIS — E039 Hypothyroidism, unspecified: Secondary | ICD-10-CM

## 2014-09-11 DIAGNOSIS — R443 Hallucinations, unspecified: Secondary | ICD-10-CM | POA: Diagnosis not present

## 2014-09-11 DIAGNOSIS — E860 Dehydration: Secondary | ICD-10-CM | POA: Diagnosis present

## 2014-09-11 DIAGNOSIS — Z882 Allergy status to sulfonamides status: Secondary | ICD-10-CM

## 2014-09-11 DIAGNOSIS — I4891 Unspecified atrial fibrillation: Secondary | ICD-10-CM | POA: Diagnosis present

## 2014-09-11 DIAGNOSIS — R410 Disorientation, unspecified: Secondary | ICD-10-CM

## 2014-09-11 DIAGNOSIS — Z66 Do not resuscitate: Secondary | ICD-10-CM | POA: Diagnosis present

## 2014-09-11 DIAGNOSIS — Z7982 Long term (current) use of aspirin: Secondary | ICD-10-CM

## 2014-09-11 DIAGNOSIS — J45909 Unspecified asthma, uncomplicated: Secondary | ICD-10-CM | POA: Diagnosis present

## 2014-09-11 DIAGNOSIS — F329 Major depressive disorder, single episode, unspecified: Secondary | ICD-10-CM | POA: Diagnosis present

## 2014-09-11 DIAGNOSIS — Z8572 Personal history of non-Hodgkin lymphomas: Secondary | ICD-10-CM

## 2014-09-11 DIAGNOSIS — M4800 Spinal stenosis, site unspecified: Secondary | ICD-10-CM | POA: Diagnosis present

## 2014-09-11 DIAGNOSIS — R4182 Altered mental status, unspecified: Secondary | ICD-10-CM

## 2014-09-11 DIAGNOSIS — M810 Age-related osteoporosis without current pathological fracture: Secondary | ICD-10-CM | POA: Diagnosis present

## 2014-09-11 DIAGNOSIS — I10 Essential (primary) hypertension: Secondary | ICD-10-CM | POA: Diagnosis present

## 2014-09-11 DIAGNOSIS — C8590 Non-Hodgkin lymphoma, unspecified, unspecified site: Secondary | ICD-10-CM

## 2014-09-11 DIAGNOSIS — G8929 Other chronic pain: Secondary | ICD-10-CM | POA: Diagnosis present

## 2014-09-11 DIAGNOSIS — C801 Malignant (primary) neoplasm, unspecified: Secondary | ICD-10-CM

## 2014-09-11 HISTORY — DX: Fracture of unspecified part of neck of left femur, initial encounter for closed fracture: S72.002A

## 2014-09-11 HISTORY — DX: Diverticulosis of intestine, part unspecified, without perforation or abscess without bleeding: K57.90

## 2014-09-11 HISTORY — DX: Unspecified osteoarthritis, unspecified site: M19.90

## 2014-09-11 HISTORY — DX: Hypothyroidism, unspecified: E03.9

## 2014-09-11 HISTORY — DX: Non-Hodgkin lymphoma, unspecified, unspecified site: C85.90

## 2014-09-11 HISTORY — DX: Obstructive sleep apnea (adult) (pediatric): G47.33

## 2014-09-11 HISTORY — DX: Unspecified fracture of shaft of humerus, right arm, initial encounter for closed fracture: S42.301A

## 2014-09-11 HISTORY — DX: Myasthenia gravis without (acute) exacerbation: G70.00

## 2014-09-11 HISTORY — DX: Unspecified asthma, uncomplicated: J45.909

## 2014-09-11 LAB — CBC WITH DIFFERENTIAL/PLATELET
Basophils Absolute: 0.1 10*3/uL (ref 0–0.1)
Basophils Relative: 1 %
EOS ABS: 0.2 10*3/uL (ref 0–0.7)
Eosinophils Relative: 2 %
HCT: 41.8 % (ref 35.0–47.0)
Hemoglobin: 13.6 g/dL (ref 12.0–16.0)
LYMPHS ABS: 1.6 10*3/uL (ref 1.0–3.6)
Lymphocytes Relative: 20 %
MCH: 29.4 pg (ref 26.0–34.0)
MCHC: 32.5 g/dL (ref 32.0–36.0)
MCV: 90.5 fL (ref 80.0–100.0)
MONOS PCT: 11 %
Monocytes Absolute: 0.9 10*3/uL (ref 0.2–0.9)
Neutro Abs: 5.2 10*3/uL (ref 1.4–6.5)
Neutrophils Relative %: 66 %
Platelets: 225 10*3/uL (ref 150–440)
RBC: 4.62 MIL/uL (ref 3.80–5.20)
RDW: 15.7 % — ABNORMAL HIGH (ref 11.5–14.5)
WBC: 8 10*3/uL (ref 3.6–11.0)

## 2014-09-11 LAB — URINALYSIS COMPLETE WITH MICROSCOPIC (ARMC ONLY)
BILIRUBIN URINE: NEGATIVE
Bacteria, UA: NONE SEEN
GLUCOSE, UA: NEGATIVE mg/dL
HGB URINE DIPSTICK: NEGATIVE
Ketones, ur: NEGATIVE mg/dL
LEUKOCYTES UA: NEGATIVE
Nitrite: NEGATIVE
PH: 5 (ref 5.0–8.0)
Protein, ur: 30 mg/dL — AB
SPECIFIC GRAVITY, URINE: 1.019 (ref 1.005–1.030)

## 2014-09-11 LAB — COMPREHENSIVE METABOLIC PANEL
ALBUMIN: 2.9 g/dL — AB (ref 3.5–5.0)
ALT: 21 U/L (ref 14–54)
ANION GAP: 11 (ref 5–15)
AST: 22 U/L (ref 15–41)
Alkaline Phosphatase: 72 U/L (ref 38–126)
BILIRUBIN TOTAL: 0.6 mg/dL (ref 0.3–1.2)
BUN: 19 mg/dL (ref 6–20)
CALCIUM: 8.7 mg/dL — AB (ref 8.9–10.3)
CHLORIDE: 97 mmol/L — AB (ref 101–111)
CO2: 30 mmol/L (ref 22–32)
Creatinine, Ser: 0.92 mg/dL (ref 0.44–1.00)
GFR calc Af Amer: >60 mL/min (ref >60)
GFR calc non Af Amer: 56 mL/min — ABNORMAL LOW (ref >60)
Glucose, Bld: 111 mg/dL — ABNORMAL HIGH (ref 65–99)
POTASSIUM: 4.1 mmol/L (ref 3.5–5.1)
SODIUM: 138 mmol/L (ref 135–145)
TOTAL PROTEIN: 6.6 g/dL (ref 6.5–8.1)

## 2014-09-11 LAB — TROPONIN I: Troponin I: 0.03 ng/mL (ref <0.031)

## 2014-09-11 NOTE — ED Notes (Signed)
Pt to ED via EMS from [facility] c/o decreased LOC today.  Per EMS states pt had decreased LOC after eating dinner tonight around 1700 tonight.  Family states pt was stating "I'm really tired", pt arousable but would fall back asleep immediately according to family.  Family also states pt called out for mother.  Family states pt seen 2 weeks ago at walk in clinic for UTI but told it was negative.

## 2014-09-11 NOTE — ED Provider Notes (Signed)
[facility] Emergency Department Provider Note   ____________________________________________  Time seen: 9 PM I have reviewed the triage vital signs and the triage nursing note.  HISTORY  Chief Complaint Altered Mental Status   Historian Limited as patient has altered mental status and is a poor historian History provided by the daughter  HPI [name] is a 81 y.o. female who is brought in by her family over concern for decreased mental status including some confusion and repeating herself possibly since yesterday. She does live at the [place] [facility] and her family visited today and noticed that she was arousable with fall asleep immediately. There is no report of increased doses of pain or other sedating medications. She was seen 2 weeks ago at a walk-in clinic for dysuria but had a urinalysis that was negative at that time. She has not had a fever cough, trouble breathing, vomiting, diarrhea, or abdominal pain.Symptoms are moderate.  An additional daughter did come in and stated the patient has been "talking out of her head today "she was asking if the daughters were going to take a shower totally out of context, and seemed like she was seeing things that weren't there.    Past Medical History  Diagnosis Date  . Hypertension   . Thyroid disorder   . Lymphoma     Patient Active Problem List   Diagnosis Date Noted  . Diplopia 12/07/2012    Past Surgical History  Procedure Laterality Date  . Nm pet lymphoma      Current Outpatient Rx  Name  Route  Sig  Dispense  Refill  . aspirin EC 81 MG tablet   Oral   Take 81 mg by mouth daily.         Marland Kitchen atenolol (TENORMIN) 25 MG tablet   Oral   Take 25 mg by mouth daily.          . benazepril-hydrochlorthiazide (LOTENSIN HCT) 20-12.5 MG per tablet   Oral   Take 1 tablet by mouth daily.          . clotrimazole-betamethasone (LOTRISONE) cream      as needed.          Marland Kitchen levothyroxine (SYNTHROID,  LEVOTHROID) 75 MCG tablet   Oral   Take 75 mcg by mouth daily.          Marland Kitchen pyridostigmine (MESTINON) 60 MG tablet   Oral   Take 0.5 tablets (30 mg total) by mouth 2 (two) times daily.   60 tablet   3     Allergies Betadine; Sulfa antibiotics; and Valium  Family History  Problem Relation Age of Onset  . Heart Problems Mother   . Cancer Father     Social History History  Substance Use Topics  . Smoking status: Never Smoker   . Smokeless tobacco: Never Used  . Alcohol Use: No    Review of Systems Limited as patient is a poor historian with altered mental status Constitutional: Negative for fever. Eyes:  ENT: Negative for sore throat. Cardiovascular: Negative for chest pain. Respiratory: Negative for shortness of breath. Gastrointestinal: Negative for abdominal pain, vomiting and diarrhea. Genitourinary: Positive about 2 weeks ago for dysuria. Musculoskeletal: Negative for back pain. Skin: Negative for rash. Neurological: Negative for headaches, focal weakness or numbness.  ____________________________________________   PHYSICAL EXAM:  VITAL SIGNS: ED Triage Vitals  Enc Vitals Group     BP --      Pulse --      Resp --  Temp --      Temp src --      SpO2 --      Weight --      Height --      Head Cir --      Peak Flow --      Pain Score --      Pain Loc --      Pain Edu? --      Excl. in [place]? --      Constitutional: Alert and cooperative but very hard of hearing. Well appearing and in no distress. Eyes: Conjunctivae are normal. PERRL. Normal extraocular movements. ENT   Head: Normocephalic and atraumatic.   Nose: No congestion/rhinnorhea.   Mouth/Throat: Mucous membranes are moist.   Neck: No stridor. Cardiovascular: Normal rate, regular rhythm.  No murmurs, rubs, or gallops. Respiratory: Normal respiratory effort without tachypnea nor retractions. Breath sounds are clear and equal bilaterally. No  wheezes/rales/rhonchi. Gastrointestinal: Soft and nontender. No distention. Obese Genitourinary: Musculoskeletal: Nontender with normal range of motion in all extremities. No joint effusions.  No lower extremity tenderness. 2+ pitting edema in the both lower extremity Neurologic: No slurred speech. No facial droop No gross focal neurologic deficits are appreciated.  Skin:  Skin is warm, dry and intact. No rash noted. Psychiatric: Mood and affect are normal ____________________________________________   EKG  A. fib. 75 bpm. Narrow QRS. Normal axis. Nonspecific ST and T-wave. ____________________________________________  LABS (pertinent positives/negatives)  CBC within normal limits Urinalysis without signs of urinary tract infection Metabolic panel: No acute abnormalities Troponin: 0.03  ____________________________________________  RADIOLOGY Radiologist results reviewed  CT head: Negative, unchanged atrophy and chronic small vessel ischemic change Chest x-ray: No findings of CHF, ill-defined density at the left face may reflect atelectasis or soft tissue attenuation. __________________________________________  PROCEDURES  Procedure(s) performed: No Critical Care performed: No  ____________________________________________   ED COURSE / ASSESSMENT AND PLAN  Pertinent labs & imaging results that were available during my care of the patient were reviewed by me and considered in my medical decision making (see chart for details).  Family states the patient is not at her baseline with some confusion/decreased mental status/hallucinations. She is very hard of hearing here and does not appear to be obviously interacting with external stimuli, and has no focal neurologic deficit. However since she is not at her baseline I discussed with the family this could be due to some sort of medication side effect and she will need observation overnight until patient is back to her baseline  mental status.  Patient has not a cough, nor fever, and does not know a white blood cell count. I do not suspect that the minimal soft tissue attenuation a chest x-ray to be reflective of an acute pneumonia. ___________________________________________   FINAL CLINICAL IMPRESSION(S) / ED DIAGNOSES   Altered mental status, delirium, likely polypharmacy  Lisa Roca, MD 09/11/14 2340

## 2014-09-12 ENCOUNTER — Inpatient Hospital Stay: Payer: PPO

## 2014-09-12 ENCOUNTER — Encounter: Payer: Self-pay | Admitting: Internal Medicine

## 2014-09-12 DIAGNOSIS — Z66 Do not resuscitate: Secondary | ICD-10-CM | POA: Diagnosis present

## 2014-09-12 DIAGNOSIS — Z882 Allergy status to sulfonamides status: Secondary | ICD-10-CM | POA: Diagnosis not present

## 2014-09-12 DIAGNOSIS — I4891 Unspecified atrial fibrillation: Secondary | ICD-10-CM | POA: Diagnosis present

## 2014-09-12 DIAGNOSIS — I10 Essential (primary) hypertension: Secondary | ICD-10-CM | POA: Diagnosis present

## 2014-09-12 DIAGNOSIS — Z515 Encounter for palliative care: Secondary | ICD-10-CM | POA: Diagnosis not present

## 2014-09-12 DIAGNOSIS — Z7401 Bed confinement status: Secondary | ICD-10-CM | POA: Diagnosis not present

## 2014-09-12 DIAGNOSIS — Z8572 Personal history of non-Hodgkin lymphomas: Secondary | ICD-10-CM | POA: Diagnosis not present

## 2014-09-12 DIAGNOSIS — Z79891 Long term (current) use of opiate analgesic: Secondary | ICD-10-CM | POA: Diagnosis not present

## 2014-09-12 DIAGNOSIS — J45909 Unspecified asthma, uncomplicated: Secondary | ICD-10-CM | POA: Diagnosis present

## 2014-09-12 DIAGNOSIS — R4182 Altered mental status, unspecified: Secondary | ICD-10-CM | POA: Diagnosis present

## 2014-09-12 DIAGNOSIS — G9340 Encephalopathy, unspecified: Secondary | ICD-10-CM | POA: Diagnosis present

## 2014-09-12 DIAGNOSIS — R410 Disorientation, unspecified: Secondary | ICD-10-CM | POA: Diagnosis not present

## 2014-09-12 DIAGNOSIS — F329 Major depressive disorder, single episode, unspecified: Secondary | ICD-10-CM | POA: Diagnosis present

## 2014-09-12 DIAGNOSIS — Z7982 Long term (current) use of aspirin: Secondary | ICD-10-CM | POA: Diagnosis not present

## 2014-09-12 DIAGNOSIS — G4733 Obstructive sleep apnea (adult) (pediatric): Secondary | ICD-10-CM | POA: Diagnosis present

## 2014-09-12 DIAGNOSIS — R443 Hallucinations, unspecified: Secondary | ICD-10-CM | POA: Diagnosis not present

## 2014-09-12 DIAGNOSIS — E860 Dehydration: Secondary | ICD-10-CM | POA: Diagnosis present

## 2014-09-12 DIAGNOSIS — M810 Age-related osteoporosis without current pathological fracture: Secondary | ICD-10-CM | POA: Diagnosis present

## 2014-09-12 DIAGNOSIS — C8510 Unspecified B-cell lymphoma, unspecified site: Secondary | ICD-10-CM | POA: Diagnosis not present

## 2014-09-12 DIAGNOSIS — B349 Viral infection, unspecified: Secondary | ICD-10-CM | POA: Diagnosis present

## 2014-09-12 DIAGNOSIS — E039 Hypothyroidism, unspecified: Secondary | ICD-10-CM

## 2014-09-12 DIAGNOSIS — M4800 Spinal stenosis, site unspecified: Secondary | ICD-10-CM | POA: Diagnosis present

## 2014-09-12 DIAGNOSIS — G8929 Other chronic pain: Secondary | ICD-10-CM | POA: Diagnosis present

## 2014-09-12 DIAGNOSIS — G7000 Myasthenia gravis without (acute) exacerbation: Secondary | ICD-10-CM | POA: Diagnosis not present

## 2014-09-12 LAB — CREATININE, SERUM
Creatinine, Ser: 0.89 mg/dL (ref 0.44–1.00)
GFR calc Af Amer: >60 mL/min (ref >60)
GFR calc non Af Amer: 58 mL/min — ABNORMAL LOW (ref >60)

## 2014-09-12 LAB — CBC
HCT: 39 % (ref 35.0–47.0)
Hemoglobin: 12.8 g/dL (ref 12.0–16.0)
MCH: 29.6 pg (ref 26.0–34.0)
MCHC: 32.8 g/dL (ref 32.0–36.0)
MCV: 90.2 fL (ref 80.0–100.0)
PLATELETS: 208 10*3/uL (ref 150–440)
RBC: 4.32 MIL/uL (ref 3.80–5.20)
RDW: 15.4 % — AB (ref 11.5–14.5)
WBC: 7.5 10*3/uL (ref 3.6–11.0)

## 2014-09-12 LAB — TSH
TSH: 4.069 u[IU]/mL (ref 0.350–4.500)
TSH: 2.645 u[IU]/mL (ref 0.350–4.500)

## 2014-09-12 LAB — FOLATE: Folate: 27 ng/mL (ref >5.9)

## 2014-09-12 LAB — VITAMIN B12: VITAMIN B 12: 443 pg/mL (ref 180–914)

## 2014-09-12 LAB — AMMONIA: Ammonia: 16 umol/L (ref 9–35)

## 2014-09-12 LAB — SEDIMENTATION RATE: SED RATE: 44 mm/h — AB (ref 0–30)

## 2014-09-12 MED ORDER — ATENOLOL 25 MG PO TABS
25.0000 mg | ORAL_TABLET | Freq: Every day | ORAL | Status: DC
Start: 2014-09-12 — End: 2014-09-20
  Administered 2014-09-12 – 2014-09-20 (×9): 25 mg via ORAL
  Filled 2014-09-12 (×11): qty 1

## 2014-09-12 MED ORDER — BENAZEPRIL HCL 20 MG PO TABS
20.0000 mg | ORAL_TABLET | Freq: Every day | ORAL | Status: DC
  Administered 2014-09-12 – 2014-09-20 (×9): 20 mg via ORAL
  Filled 2014-09-12 (×10): qty 1

## 2014-09-12 MED ORDER — BENAZEPRIL-HYDROCHLOROTHIAZIDE 20-12.5 MG PO TABS: 1.0000 | ORAL_TABLET | Freq: Every day | ORAL | Status: DC

## 2014-09-12 MED ORDER — ASPIRIN EC 81 MG PO TBEC
81.0000 mg | DELAYED_RELEASE_TABLET | Freq: Every day | ORAL | Status: DC
Start: 2014-09-12 — End: 2014-09-12

## 2014-09-12 MED ORDER — SODIUM CHLORIDE 0.9 % IV SOLN
INTRAVENOUS | Status: AC
  Administered 2014-09-12 – 2014-09-13 (×2): via INTRAVENOUS

## 2014-09-12 MED ORDER — ONDANSETRON HCL 4 MG PO TABS: 4.0000 mg | ORAL_TABLET | Freq: Four times a day (QID) | ORAL | Status: DC | PRN

## 2014-09-12 MED ORDER — GADOBENATE DIMEGLUMINE 529 MG/ML IV SOLN
19.0000 mL | Freq: Once | INTRAVENOUS | Status: AC | PRN
  Administered 2014-09-12: 19 mL via INTRAVENOUS

## 2014-09-12 MED ORDER — ASPIRIN EC 81 MG PO TBEC
81.0000 mg | DELAYED_RELEASE_TABLET | Freq: Every day | ORAL | Status: DC
  Administered 2014-09-12 – 2014-09-20 (×8): 81 mg via ORAL
  Filled 2014-09-12 (×9): qty 1

## 2014-09-12 MED ORDER — ACETAMINOPHEN 325 MG PO TABS
650.0000 mg | ORAL_TABLET | Freq: Four times a day (QID) | ORAL | Status: DC | PRN
  Administered 2014-09-15: 650 mg via ORAL
  Filled 2014-09-12: qty 2

## 2014-09-12 MED ORDER — ONDANSETRON HCL 4 MG/2ML IJ SOLN: 4.0000 mg | Freq: Four times a day (QID) | INTRAMUSCULAR | Status: DC | PRN

## 2014-09-12 MED ORDER — LEVOTHYROXINE SODIUM 75 MCG PO TABS
75.0000 ug | ORAL_TABLET | Freq: Every day | ORAL | Status: DC
  Administered 2014-09-12 – 2014-09-18 (×6): 75 ug via ORAL
  Filled 2014-09-12 (×7): qty 1

## 2014-09-12 MED ORDER — PYRIDOSTIGMINE BROMIDE 60 MG PO TABS
30.0000 mg | ORAL_TABLET | Freq: Two times a day (BID) | ORAL | Status: DC
  Filled 2014-09-12 (×2): qty 0.5

## 2014-09-12 MED ORDER — ACETAMINOPHEN 650 MG RE SUPP: 650.0000 mg | Freq: Four times a day (QID) | RECTAL | Status: DC | PRN

## 2014-09-12 MED ORDER — HYDROCHLOROTHIAZIDE 12.5 MG PO CAPS
12.5000 mg | ORAL_CAPSULE | Freq: Every day | ORAL | Status: DC
  Administered 2014-09-12 – 2014-09-20 (×9): 12.5 mg via ORAL
  Filled 2014-09-12 (×10): qty 1

## 2014-09-12 MED ORDER — ENOXAPARIN SODIUM 40 MG/0.4ML ~~LOC~~ SOLN
40.0000 mg | SUBCUTANEOUS | Status: DC
  Administered 2014-09-12 – 2014-09-17 (×6): 40 mg via SUBCUTANEOUS
  Filled 2014-09-12 (×6): qty 0.4

## 2014-09-12 NOTE — Care Management Note (Signed)
Case Management Note  Patient Details  Name: UNKNOWN FLANNIGAN MRN: [id number] Date of Birth: 20-Jul-1929  Subjective/Objective:     Presents from [facility] with acute encephalopathy of unknown origin. Confused.  Symptoms present for 1.5 weeks. At baseline pt is wheelchair bound. She is active with [facility] for PT services.  [name]. Explained RNCM, SW role and discharge process. Following Action/Plan: It is anticipated that pt will return to [facility] with [facility] resumption of care.   Expected Discharge Date:                  Expected Discharge Plan:  Assisted Living / Rest Home  In-House Referral:  Clinical Social Work  Discharge planning Services  CM Consult  Post Acute Care Choice:    Choice offered to:     DME Arranged:    DME Agency:     HH Arranged:    HH Agency:     Status of Service:  In process, will continue to follow  Medicare Important Message Given:    Date Medicare IM Given:    Medicare IM give by:    Date Additional Medicare IM Given:    Additional Medicare Important Message give by:     If discussed at [place] of Stay Meetings, dates discussed:    Additional Comments:  Jolly Mango, RN 09/12/2014, 9:59 AM

## 2014-09-12 NOTE — Progress Notes (Signed)
Clinical Education officer, museum (CSW) spoke with Environmental health practitioner at [facility]. Per [facility] patient can return when medically stable. Bell did confirm that patient mostly is wheel chair bound. Bell reported that patient receives PT from [facility]. RN Case Manager aware. CSW will continue to follow and assist as needed.   Blima Rich, [place] [phone]

## 2014-09-12 NOTE — H&P (Signed)
[facility] NAME: [name]    MR#:  [id number]  DATE OF BIRTH:  [birth year]  DATE OF ADMISSION:  09/11/2014  PRIMARY CARE PHYSICIAN: Lavera Guise, MD   REQUESTING/REFERRING PHYSICIAN: Lisa Roca  CHIEF COMPLAINT:   Chief Complaint  Patient presents with  . Altered Mental Status   Estimated episodes of decreased level of consciousness with confusion for the past one and half weeks HISTORY OF PRESENT ILLNESS:  [name]  is a 81 y.o. female with below mentioned past medical history, a resident of [facility], on DO NOT RESUSCITATE status, brought in with the complaints of intermittent episodes of decreased sensorium/confusion ongoing for the past 1-1/2 weeks per her daughter who is with her bedside at this time. Patient is alert awake and oriented 1, not in any distress, hard of hearing, responding to questions in short sentences but not able to give a detailed history. Hence the history I got is from the patient's daughter who is with her at this time and from the ED physician's note. No history of any fever, chills, shortness of breath, chest pain, focal weakness or numbness, nausea, vomiting, diarrhea, abdominal pain, dysuria. History of recent UTI and treated with Cipro which she completed recently. Patient is basically bedbound. She was evaluated by the ED physician, found to be with stable vital signs, afebrile. Workup revealed normal labs, CT head negative for any acute intracranial pathology, chest x-ray ill-defined opacity left lung base of unclear significance. EKG A. fib with ventricular rate of 75 bpm with no acute ischemic changes. As per the medication list from the assisted living center, patient is on trazodone and chronic pain medications. It was thought by the ED physician decreased mentation could be related to her medications, hence hospice service was consulted for further  management.  PAST MEDICAL HISTORY:   Past Medical History  Diagnosis Date  . Hypertension   . Thyroid disorder   . Lymphoma   . Hypothyroidism   . Arthritis     PAST SURGICAL HISTORY:   Past Surgical History  Procedure Laterality Date  . Nm pet lymphoma    . Hernia repair    . Cholecystectomy    . Back surgery      SOCIAL HISTORY:   History  Substance Use Topics  . Smoking status: Never Smoker   . Smokeless tobacco: Never Used  . Alcohol Use: No    FAMILY HISTORY:   Family History  Problem Relation Age of Onset  . Heart Problems Mother   . Cancer Father     DRUG ALLERGIES:   Allergies  Allergen Reactions  . Betadine [Povidone Iodine] Hives  . Sulfa Antibiotics   . Valium [Diazepam] Anxiety    REVIEW OF SYSTEMS:   Review of Systems  Constitutional: Negative for fever, chills and malaise/fatigue.  HENT: Positive for hearing loss. Negative for ear pain, nosebleeds, sore throat and tinnitus.   Eyes: Negative for blurred vision, double vision, pain, discharge and redness.  Respiratory: Negative for cough, hemoptysis, sputum production, shortness of breath and wheezing.   Cardiovascular: Negative for chest pain, palpitations, orthopnea and leg swelling.  Gastrointestinal: Negative for nausea, vomiting, abdominal pain, diarrhea, constipation, blood in stool and melena.  Genitourinary: Negative for dysuria, urgency, frequency and hematuria.  Musculoskeletal: Positive for back pain. Negative for joint pain and neck pain.  Skin: Negative for itching and rash.  Neurological: Negative for dizziness, tingling, sensory change,  focal weakness and seizures.       Positive for intermittent episodes of confusion with decreased mentation as noted in history of present illness.  Endo/Heme/Allergies: Does not bruise/bleed easily.  Psychiatric/Behavioral: Negative for depression. The patient is not nervous/anxious.     MEDICATIONS AT HOME:   Prior to Admission  medications   Medication Sig Start Date End Date Taking? Authorizing Provider  aspirin EC 81 MG tablet Take 81 mg by mouth daily.    Historical Provider, MD  atenolol (TENORMIN) 25 MG tablet Take 25 mg by mouth daily.  12/05/12   Historical Provider, MD  benazepril-hydrochlorthiazide (LOTENSIN HCT) 20-12.5 MG per tablet Take 1 tablet by mouth daily.  11/05/12   Historical Provider, MD  clotrimazole-betamethasone (LOTRISONE) cream as needed.  09/01/12   Historical Provider, MD  levothyroxine (SYNTHROID, LEVOTHROID) 75 MCG tablet Take 75 mcg by mouth daily.  12/05/12   Historical Provider, MD  pyridostigmine (MESTINON) 60 MG tablet Take 0.5 tablets (30 mg total) by mouth 2 (two) times daily. 08/25/13   Drema Dallas, DO      VITAL SIGNS:  Blood pressure 145/105, pulse 69, temperature 97.4 F (36.3 C), temperature source Oral, resp. rate 18, height 5\' 1"  (1.549 m), weight 96.299 kg (212 lb 4.8 oz), SpO2 98 %.  PHYSICAL EXAMINATION:  Physical Exam  Constitutional: She appears well-developed and well-nourished. No distress.  HENT:  Head: Normocephalic and atraumatic.  Right Ear: External ear normal.  Left Ear: External ear normal.  Nose: Nose normal.  Mouth/Throat: Oropharynx is clear and moist. No oropharyngeal exudate.  Eyes: EOM are normal. Pupils are equal, round, and reactive to light. No scleral icterus.  Neck: Normal range of motion. Neck supple. No JVD present. No thyromegaly present.  Cardiovascular: Normal rate, regular rhythm, normal heart sounds and intact distal pulses.  Exam reveals no friction rub.   No murmur heard. Respiratory: Effort normal and breath sounds normal. No respiratory distress. She has no wheezes. She has no rales. She exhibits no tenderness.  GI: Soft. Bowel sounds are normal. She exhibits no distension and no mass. There is no tenderness. There is no rebound and no guarding.  Musculoskeletal: Normal range of motion. She exhibits no edema.  Lymphadenopathy:    She  has no cervical adenopathy.  Neurological: She is alert. She has normal reflexes. She displays normal reflexes. No cranial nerve deficit. She exhibits normal muscle tone.  Patient is alert awake and oriented 1, following commands, moving all 4 extremities well.  Skin: Skin is warm. No rash noted. No erythema.  Psychiatric: She has a normal mood and affect. Her behavior is normal. Thought content normal.   LABORATORY PANEL:   CBC  Recent Labs Lab 09/11/14 2233  WBC 8.0  HGB 13.6  HCT 41.8  PLT 225   ------------------------------------------------------------------------------------------------------------------  Chemistries   Recent Labs Lab 09/11/14 2233  NA 138  K 4.1  CL 97*  CO2 30  GLUCOSE 111*  BUN 19  CREATININE 0.92  CALCIUM 8.7*  AST 22  ALT 21  ALKPHOS 72  BILITOT 0.6   ------------------------------------------------------------------------------------------------------------------  Cardiac Enzymes  Recent Labs Lab 09/11/14 2233  TROPONINI 0.03   ------------------------------------------------------------------------------------------------------------------  RADIOLOGY:  Ct Head Wo Contrast  09/11/2014   CLINICAL DATA:  Altered mental status. Decreased level of consciousness.  EXAM: CT HEAD WITHOUT CONTRAST  TECHNIQUE: Contiguous axial images were obtained from the base of the skull through the vertex without intravenous contrast.  COMPARISON:  07/06/2014  FINDINGS: No intracranial  hemorrhage, mass effect, or midline shift. No hydrocephalus. The basilar cisterns are patent. No evidence of territorial infarct. No intracranial fluid collection. Central atrophy and chronic small vessel ischemic change, stable from prior exam. Calvarium is intact. Included paranasal sinuses and mastoid air cells are well aerated.  IMPRESSION: 1.  No acute intracranial abnormality. 2. Unchanged atrophy and chronic small vessel ischemic change.   Electronically Signed   By:  Jeb Levering M.D.   On: 09/11/2014 22:56   Dg Chest Port 1 View  09/11/2014   CLINICAL DATA:  Weakness, confusion, leg swelling, clinical concern for CHF.  EXAM: PORTABLE CHEST - 1 VIEW  COMPARISON:  07/09/2014  FINDINGS: Lower lung volumes from prior exam. Stable borderline mild cardiomegaly. No pulmonary edema. Ill-defined densities the left lung base. No pneumothorax. Degenerative change in both shoulders. No acute osseous abnormalities are seen.  IMPRESSION: 1. Ill-defined densities at the left lung base, may reflect atelectasis or be related to soft tissue attenuation. Small pleural effusion or minimal pneumonia could have a similar appearance. 2. No radiographic findings of CHF.   Electronically Signed   By: Jeb Levering M.D.   On: 09/11/2014 23:05    EKG:   Orders placed or performed during the hospital encounter of 09/11/14  . ED EKG  . ED EKG  A. fib with ventricular rate of 75 bpm, no acute ischemic changes.  IMPRESSION AND PLAN:   1. Acute encephalopathy. Per patient's daughter she is noted to have intermittent episodes of decreased mentation/confusion for the past one and half weeks. Etiology unclear. All labs normal, CT head negative for acute IC pathology, no history of fever and patient does not look septic and patient not hypoxic.?? Related to chronic pain medications and trazodone.  Plan: Admit to medical floor, neuro watch, hold off chronic pain medications and trazodone for now, check serum B12 levels and a TSH. Consider further workup including neurology consultation if patient continues to have symptoms. 2. Hypertension, stable on home medications. Continue same. 3. History of hypothyroidism, on levothyroxine. Continue same. Check TSH. 4. Atrial fibrillation with controlled ventricular rate, likely chronic. No acute problems at present. Per patient's daughter, patient refused cardiac workup in the past. Monitor. 5. Chronic backache/spinal stenosis, patient on chronic  pain meds when necessary. We will hold off chronic pain medications for now, continue Tylenol. 6. History of lymphoma, no acute problems.    All the records are reviewed and case discussed with ED provider. Management plans discussed with the patient, family and they are in agreement.  CODE STATUS: DO NOT RESUSCITATE Healthcare power of attorney-patient's daughter  TOTAL TIME TAKING CARE OF THIS PATIENT: 50 minutes.    Juluis Mire M.D on 09/12/2014 at 1:11 AM  Between 7am to 6pm - Pager - [phone]  After 6pm go to [email or web address] - password [email or web address] [facility]  Office  [phone]  CC: Primary care physician; Lavera Guise, MD

## 2014-09-12 NOTE — Care Management Note (Signed)
Case Management Note  Patient Details  Name: [name] MRN: [id number] Date of Birth: Sep 06, 1929  Subjective/Objective:      Spoke to Dr Fritzi Mandes about pt  Changing to OBSV and she has not yet seen the pt.     To make a decision.        Action/Plan:   Expected Discharge Date:                  Expected Discharge Plan:     In-House Referral:     Discharge planning Services     Post Acute Care Choice:    Choice offered to:     DME Arranged:    DME Agency:     HH Arranged:    [facility] Agency:     Status of Service:     Medicare Important Message Given:    Date Medicare IM Given:    Medicare IM give by:    Date Additional Medicare IM Given:    Additional Medicare Important Message give by:     If discussed at [place] of Stay Meetings, dates discussed:    Additional Comments:  Beau Fanny, RN 09/12/2014, 8:07 AM

## 2014-09-12 NOTE — Progress Notes (Signed)
Patient ID: [name], female   DOB: 07-Apr-1930, 81 y.o.   MRN: [id number] [facility] NAME: [name]    MR#:  [id number]  DATE OF BIRTH:  1929/12/12  SUBJECTIVE:  dter in the room. Pt quiet confused and 'talking out of head'. No fever, poor appetite  REVIEW OF SYSTEMS:    Review of Systems  Unable to perform ROS: mental status change    Tolerating Diet: very little Tolerating PT: not done yet  DRUG ALLERGIES:   Allergies  Allergen Reactions  . Betadine [Povidone Iodine] Hives  . Sulfa Antibiotics   . Valium [Diazepam] Anxiety    VITALS:  Blood pressure 118/92, pulse 91, temperature 97.7 F (36.5 C), temperature source Oral, resp. rate 20, height 5\' 1"  (1.549 m), weight 93.713 kg (206 lb 9.6 oz), SpO2 96 %.  PHYSICAL EXAMINATION:   Physical Exam  GENERAL:  82 y.o.-year-old patient lying in the bed with no acute distress.  EYES: Pupils equal, round, reactive to light and accommodation. No scleral icterus. Extraocular muscles intact.  HEENT: Head atraumatic, normocephalic. Oropharynx and nasopharynx clear.  NECK:  Supple, no jugular venous distention. No thyroid enlargement, no tenderness.  LUNGS: Normal breath sounds bilaterally, no wheezing, rales, rhonchi. No use of accessory muscles of respiration.  CARDIOVASCULAR: S1, S2 normal. No murmurs, rubs, or gallops.  ABDOMEN: Soft, nontender, nondistended. Bowel sounds present. No organomegaly or mass.  EXTREMITIES: No cyanosis, clubbing or edema b/l.    NEUROLOGIC: unable to assess due to MS changes PSYCHIATRIC:  patient is drowsy and trying to stay awake SKIN: No obvious rash, lesion, or ulcer.    LABORATORY PANEL:   CBC  Recent Labs Lab 09/12/14 0647  WBC 7.5  HGB 12.8  HCT 39.0  PLT 208   ------------------------------------------------------------------------------------------------------------------  Chemistries   Recent Labs Lab  09/11/14 2233 09/12/14 0647  NA 138  --   K 4.1  --   CL 97*  --   CO2 30  --   GLUCOSE 111*  --   BUN 19  --   CREATININE 0.92 0.89  CALCIUM 8.7*  --   AST 22  --   ALT 21  --   ALKPHOS 72  --   BILITOT 0.6  --    ------------------------------------------------------------------------------------------------------------------  Cardiac Enzymes  Recent Labs Lab 09/11/14 2233  TROPONINI 0.03   ------------------------------------------------------------------------------------------------------------------  RADIOLOGY:  Ct Head Wo Contrast  09/11/2014   CLINICAL DATA:  Altered mental status. Decreased level of consciousness.  EXAM: CT HEAD WITHOUT CONTRAST  TECHNIQUE: Contiguous axial images were obtained from the base of the skull through the vertex without intravenous contrast.  COMPARISON:  07/06/2014  FINDINGS: No intracranial hemorrhage, mass effect, or midline shift. No hydrocephalus. The basilar cisterns are patent. No evidence of territorial infarct. No intracranial fluid collection. Central atrophy and chronic small vessel ischemic change, stable from prior exam. Calvarium is intact. Included paranasal sinuses and mastoid air cells are well aerated.  IMPRESSION: 1.  No acute intracranial abnormality. 2. Unchanged atrophy and chronic small vessel ischemic change.   Electronically Signed   By: Jeb Levering M.D.   On: 09/11/2014 22:56   Dg Chest Port 1 View  09/11/2014   CLINICAL DATA:  Weakness, confusion, leg swelling, clinical concern for CHF.  EXAM: PORTABLE CHEST - 1 VIEW  COMPARISON:  07/09/2014  FINDINGS: Lower lung volumes from prior exam. Stable borderline mild cardiomegaly. No pulmonary edema. Ill-defined densities  the left lung base. No pneumothorax. Degenerative change in both shoulders. No acute osseous abnormalities are seen.  IMPRESSION: 1. Ill-defined densities at the left lung base, may reflect atelectasis or be related to soft tissue attenuation. Small  pleural effusion or minimal pneumonia could have a similar appearance. 2. No radiographic findings of CHF.   Electronically Signed   By: Jeb Levering M.D.   On: 09/11/2014 23:05     ASSESSMENT AND PLAN:   1. Acute encephalopathy. Per patient's daughter she is noted to have intermittent episodes of decreased mentation/confusion for the past one and half weeks. Etiology unclear. All labs normal, CT head negative for acute IC pathology, no history of fever and patient does not look septic  -? Related to chronic pain medications and trazodone. -TSH normal -Get MRI Brain, EEG per Dr Jayme Cloud (spoke with him) -check b12  2. Hypertension, stable on home medications.  3. History of hypothyroidism, on levothyroxine.  -TSH normal  4. Atrial fibrillation with controlled ventricular rate, likely chronic. No acute problems at present. Per patient's daughter, patient refused cardiac workup in the past.  5. Chronic backache/spinal stenosis, patient on chronic pain meds when necessary. We will hold off chronic pain medications for now, continue Tylenol.  6. History of lymphoma, no acute problems.   Case discussed with Care Management/Social Worker. Management plans discussed with the patient, family and they are in agreement.  CODE STATUS DNR  DVT Prophylaxis: lovenox  TOTAL TIME TAKING CARE OF THIS PATIENT: 30 minutes.    D/C  DEPENDING ON CLINICAL CONDITION.   Dyneisha Murchison M.D on 09/12/2014 at 1:44 PM  Between 7am to 6pm - Pager - [phone]  After 6pm go to [email or web address] - password [email or web address] [facility]  Office  [phone]  CC: Primary care physician; Lavera Guise, MD

## 2014-09-12 NOTE — Consult Note (Signed)
Reason for Consult:confusion Referring Physician: Dr. Doristine Johns [name] is an 81 y.o. female.  HPI: seen at request of Dr. Posey Pronto for confusion;  81 yo RHD F presents to [facility] with confusion that has been off and on for the past two weeks per family.  Today was the worst per then where she was completely speaking out of her mind.  Pt does remember being confusion but does not know why.  She denies any focal weakness or numbness.  No seizure activity or urinary problems.  Past Medical History  Diagnosis Date  . Hypertension   . Thyroid disorder   . Lymphoma   . Hypothyroidism   . Arthritis   . Diverticulosis   . Obstructive sleep apnea   . Myasthenia gravis   . Hip fracture, left   . Fracture of right humerus     Past Surgical History  Procedure Laterality Date  . Nm pet lymphoma    . Hernia repair    . Cholecystectomy    . Back surgery      Family History  Problem Relation Age of Onset  . Heart Problems Mother   . Cancer Father     Social History:  reports that she has never smoked. She has never used smokeless tobacco. She reports that she does not drink alcohol or use illicit drugs.  Allergies:  Allergies  Allergen Reactions  . Betadine [Povidone Iodine] Hives  . Sulfa Antibiotics   . Valium [Diazepam] Anxiety    Medications: personally reviewed by me per chart  Results for orders placed or performed during the hospital encounter of 09/11/14 (from the past 48 hour(s))  Urinalysis complete, with microscopic     Status: Abnormal   Collection Time: 09/11/14  9:32 PM  Result Value Ref Range   Color, Urine YELLOW (A) YELLOW   APPearance CLEAR (A) CLEAR   Glucose, UA NEGATIVE NEGATIVE mg/dL   Bilirubin Urine NEGATIVE NEGATIVE   Ketones, ur NEGATIVE NEGATIVE mg/dL   Specific Gravity, Urine 1.019 1.005 - 1.030   Hgb urine dipstick NEGATIVE NEGATIVE   pH 5.0 5.0 - 8.0   Protein, ur 30 (A) NEGATIVE mg/dL   Nitrite NEGATIVE NEGATIVE   Leukocytes, UA NEGATIVE  NEGATIVE   RBC / HPF 0-5 0 - 5 RBC/hpf   WBC, UA 0-5 0 - 5 WBC/hpf   Bacteria, UA NONE SEEN NONE SEEN   Squamous Epithelial / LPF 0-5 (A) NONE SEEN   Mucous PRESENT    Hyaline Casts, UA PRESENT   Comprehensive metabolic panel     Status: Abnormal   Collection Time: 09/11/14 10:33 PM  Result Value Ref Range   Sodium 138 135 - 145 mmol/L   Potassium 4.1 3.5 - 5.1 mmol/L   Chloride 97 (L) 101 - 111 mmol/L   CO2 30 22 - 32 mmol/L   Glucose, Bld 111 (H) 65 - 99 mg/dL   BUN 19 6 - 20 mg/dL   Creatinine, Ser 0.92 0.44 - 1.00 mg/dL   Calcium 8.7 (L) 8.9 - 10.3 mg/dL   Total Protein 6.6 6.5 - 8.1 g/dL   Albumin 2.9 (L) 3.5 - 5.0 g/dL   AST 22 15 - 41 U/L   ALT 21 14 - 54 U/L   Alkaline Phosphatase 72 38 - 126 U/L   Total Bilirubin 0.6 0.3 - 1.2 mg/dL   GFR calc non Af Amer 56 (L) >60 mL/min   GFR calc Af Amer >60 >60 mL/min    Comment: (  NOTE) The eGFR has been calculated using the CKD EPI equation. This calculation has not been validated in all clinical situations. eGFR's persistently <60 mL/min signify possible Chronic Kidney Disease.    Anion gap 11 5 - 15  Troponin I     Status: None   Collection Time: 09/11/14 10:33 PM  Result Value Ref Range   Troponin I 0.03 <0.031 ng/mL    Comment:        NO INDICATION OF MYOCARDIAL INJURY.   CBC with Differential     Status: Abnormal   Collection Time: 09/11/14 10:33 PM  Result Value Ref Range   WBC 8.0 3.6 - 11.0 K/uL   RBC 4.62 3.80 - 5.20 MIL/uL   Hemoglobin 13.6 12.0 - 16.0 g/dL   HCT 12.6 86.4 - 85.9 %   MCV 90.5 80.0 - 100.0 fL   MCH 29.4 26.0 - 34.0 pg   MCHC 32.5 32.0 - 36.0 g/dL   RDW 84.8 (H) 85.8 - 41.8 %   Platelets 225 150 - 440 K/uL   Neutrophils Relative % 66 %   Neutro Abs 5.2 1.4 - 6.5 K/uL   Lymphocytes Relative 20 %   Lymphs Abs 1.6 1.0 - 3.6 K/uL   Monocytes Relative 11 %   Monocytes Absolute 0.9 0.2 - 0.9 K/uL   Eosinophils Relative 2 %   Eosinophils Absolute 0.2 0 - 0.7 K/uL   Basophils Relative 1 %    Basophils Absolute 0.1 0 - 0.1 K/uL  CBC     Status: Abnormal   Collection Time: 09/12/14  6:47 AM  Result Value Ref Range   WBC 7.5 3.6 - 11.0 K/uL   RBC 4.32 3.80 - 5.20 MIL/uL   Hemoglobin 12.8 12.0 - 16.0 g/dL   HCT 67.2 92.6 - 72.8 %   MCV 90.2 80.0 - 100.0 fL   MCH 29.6 26.0 - 34.0 pg   MCHC 32.8 32.0 - 36.0 g/dL   RDW 20.5 (H) 41.0 - 92.7 %   Platelets 208 150 - 440 K/uL  Creatinine, serum     Status: Abnormal   Collection Time: 09/12/14  6:47 AM  Result Value Ref Range   Creatinine, Ser 0.89 0.44 - 1.00 mg/dL   GFR calc non Af Amer 58 (L) >60 mL/min   GFR calc Af Amer >60 >60 mL/min    Comment: (NOTE) The eGFR has been calculated using the CKD EPI equation. This calculation has not been validated in all clinical situations. eGFR's persistently <60 mL/min signify possible Chronic Kidney Disease.   TSH     Status: None   Collection Time: 09/12/14  6:47 AM  Result Value Ref Range   TSH 4.069 0.350 - 4.500 uIU/mL    Ct Head Wo Contrast  09/11/2014   CLINICAL DATA:  Altered mental status. Decreased level of consciousness.  EXAM: CT HEAD WITHOUT CONTRAST  TECHNIQUE: Contiguous axial images were obtained from the base of the skull through the vertex without intravenous contrast.  COMPARISON:  07/06/2014  FINDINGS: No intracranial hemorrhage, mass effect, or midline shift. No hydrocephalus. The basilar cisterns are patent. No evidence of territorial infarct. No intracranial fluid collection. Central atrophy and chronic small vessel ischemic change, stable from prior exam. Calvarium is intact. Included paranasal sinuses and mastoid air cells are well aerated.  IMPRESSION: 1.  No acute intracranial abnormality. 2. Unchanged atrophy and chronic small vessel ischemic change.   Electronically Signed   By: Rubye Oaks M.D.   On: 09/11/2014 22:56  Mr Jeri Cos Wo Contrast  09/12/2014   CLINICAL DATA:  Altered mental status  EXAM: MRI HEAD WITHOUT AND WITH CONTRAST  TECHNIQUE:  Multiplanar, multiecho pulse sequences of the brain and surrounding structures were obtained without and with intravenous contrast.  CONTRAST:  49mL MULTIHANCE GADOBENATE DIMEGLUMINE 529 MG/ML IV SOLN  COMPARISON:  CT head 09/11/2014.  MRI 11/29/2012  FINDINGS: Image quality degraded by motion. Fast scanning protocol was performed due to motion.  Moderate atrophy. Ventricular enlargement unchanged from prior CT and MRI scans. The ventricular enlargement most consistent with atrophy.  Negative for acute infarct.  Negative for hemorrhage or fluid collection  Negative for mass or edema.  Periventricular white matter hyperintensity is patchy and most consistent with chronic microvascular ischemia. Mild progression since 2014.  Normal enhancement following contrast infusion.  IMPRESSION: Image quality degraded by moderate motion  Generalized atrophy. Chronic microvascular ischemic change in the white matter.  Negative for acute infarct or mass.   Electronically Signed   By: Franchot Gallo M.D.   On: 09/12/2014 14:44   Dg Chest Port 1 View  09/11/2014   CLINICAL DATA:  Weakness, confusion, leg swelling, clinical concern for CHF.  EXAM: PORTABLE CHEST - 1 VIEW  COMPARISON:  07/09/2014  FINDINGS: Lower lung volumes from prior exam. Stable borderline mild cardiomegaly. No pulmonary edema. Ill-defined densities the left lung base. No pneumothorax. Degenerative change in both shoulders. No acute osseous abnormalities are seen.  IMPRESSION: 1. Ill-defined densities at the left lung base, may reflect atelectasis or be related to soft tissue attenuation. Small pleural effusion or minimal pneumonia could have a similar appearance. 2. No radiographic findings of CHF.   Electronically Signed   By: Jeb Levering M.D.   On: 09/11/2014 23:05    Review of Systems  Constitutional: Positive for fever, chills and malaise/fatigue. Negative for weight loss and diaphoresis.  HENT: Negative.   Eyes: Negative.   Respiratory:  Negative.   Cardiovascular: Negative.   Gastrointestinal: Negative.   Musculoskeletal: Negative.   Skin: Negative.   Neurological: Positive for weakness. Negative for dizziness, tingling, tremors, sensory change, speech change, focal weakness, seizures and loss of consciousness.   Blood pressure 139/79, pulse 111, temperature 97.8 F (36.6 C), temperature source Oral, resp. rate 18, height $RemoveBe'5\' 1"'LzGHaGbxI$  (1.549 m), weight 93.713 kg (206 lb 9.6 oz), SpO2 95 %. Physical Exam  Constitutional: She appears well-developed and well-nourished. She appears lethargic.  HENT:  Head: Normocephalic and atraumatic.  Nose: Nose normal.  Mouth/Throat: Oropharynx is clear and moist.  Eyes: Conjunctivae and EOM are normal. Pupils are equal, round, and reactive to light.  Neck: Normal range of motion. Neck supple.  Cardiovascular: Normal rate, regular rhythm and normal heart sounds.   Respiratory: Effort normal and breath sounds normal.  GI: Soft. Bowel sounds are normal.  Musculoskeletal: Normal range of motion.  Neurological: She has normal reflexes. She appears lethargic. She displays no tremor. No cranial nerve deficit or sensory deficit. She exhibits normal muscle tone. She displays no seizure activity.  Poor participation in exam and oriented only to person,  Will name but will not attempt more complex commands  Psychiatric: Her mood appears anxious. Her affect is angry. Her speech is delayed. She is withdrawn.   MRI of brain personally reviewed by me and shows moderate atrophy and mild white matter changes  Labs reviewed by me  Assessment/Plan: 1.  Probable encephalopathy-  Hx is more consistent for this but we do not have a source as  of now 2.  Probable underlying dementia-  Pt has moderate atrophy on MRI -  EEG -  Check B12/folate, TSH, ESR, CRP -  Continue ASA $RemoveBefo'81mg'aaEkwMFXfXV$  daily -  Will follow  Damel Querry 09/12/2014, 6:19 PM

## 2014-09-12 NOTE — Progress Notes (Signed)
Initial Nutrition Assessment  DOCUMENTATION CODES:    INTERVENTION:   (Meals and Snacks: Cater to patient preferences)  NUTRITION DIAGNOSIS:  Inadequate oral intake related to acute illness as evidenced by per patient/family report, energy intake < 75% for > 7 days.  GOAL:  Patient will meet greater than or equal to 90% of their needs   MONITOR:  PO intake, Labs (Energy Intake, Digestive Profile, Electrolyte and Renal profile)  REASON FOR ASSESSMENT:  Malnutrition Screening Tool    ASSESSMENT: Reason For Admission: Acute encephalopathy PMHx:  Past Medical History  Diagnosis Date  . Hypertension   . Thyroid disorder   . Lymphoma   . Hypothyroidism   . Arthritis   . Diverticulosis   . Obstructive sleep apnea   . Myasthenia gravis   . Hip fracture, left   . Fracture of right humerus     Typical Fluid/ Food Intake: 100% of breakfast recorded this AM per I/O  Meal/ Snack Patterns: [name] reports that patient (who resides at the [facility]) has been skipping breakfast for past 1.5-2 weeks. Family has noticed a decreased appetite since this time also.  Supplements: None  Labs:  Electrolyte and Renal Profile:    Recent Labs Lab 09/11/14 2233 09/12/14 0647  BUN 19  --   CREATININE 0.92 0.89  NA 138  --   K 4.1  --    Protein Profile:   Recent Labs Lab 09/11/14 2233  ALBUMIN 2.9*    Meds: reviewed  UOP:   Intake/Output Summary (Last 24 hours) at 09/12/14 1108 Last data filed at 09/12/14 0846  Gross per 24 hour  Intake    120 ml  Output     15 ml  Net    105 ml     Physical Findings: n/a  Weight Changes: [name] reports a UBW of 220#, timeframe undefined. Reviewed previous hospital records from 4 months ago and 6 months ago. Weight in Jan 2016- 213#; Weight in Nov 2015- 219.5#. Current weight represents a 3.2% weight loss x 4 months and 6.1% weight loss x 6 months- not significant.  Height:  Ht Readings from Last 1  Encounters:  09/11/14 5\' 1"  (1.549 m)    Weight:  Wt Readings from Last 1 Encounters:  09/12/14 206 lb 9.6 oz (93.713 kg)    Ideal Body Weight:     Wt Readings from Last 10 Encounters:  09/12/14 206 lb 9.6 oz (93.713 kg)  03/31/13 219 lb (99.338 kg)  12/07/12 220 lb 4 oz (99.905 kg)    BMI:  Body mass index is 39.06 kg/(m^2).   Skin:  Reviewed, no issues  Diet Order:  Diet Heart Room service appropriate?: Yes; Fluid consistency:: Thin  EDUCATION NEEDS:  No education needs identified at this time   Intake/Output Summary (Last 24 hours) at 09/12/14 1108 Last data filed at 09/12/14 0846  Gross per 24 hour  Intake    120 ml  Output     15 ml  Net    105 ml    Last BM:  Unknown  Roda Shutters, RDN Pager: [phone] Office: [place]

## 2014-09-12 NOTE — Progress Notes (Signed)
Call pt [name]. Pt from [facility].  Daughter bedside with her today.  Pt continues to be confused. MRI today. VSS during this shift.

## 2014-09-12 NOTE — Clinical Social Work Note (Signed)
Clinical Social Work Assessment  Patient Details  Name: [name] MRN: [id number] Date of Birth: 07-29-1929  Date of referral:  09/12/14               Reason for consult:  Other (Comment Required) (From [facility])                Permission sought to share information with:  Facility Art therapist granted to share information::  Yes, Verbal Permission Granted  Name::      [facility]::     Relationship::     Contact Information:     Housing/Transportation Living arrangements for the past 2 months:  [place] of Information:  Adult Children Patient Interpreter Needed:  None Criminal Activity/Legal Involvement Pertinent to Current Situation/Hospitalization:  No - Comment as needed Significant Relationships:  Adult Children Lives with:  Facility Resident Do you feel safe going back to the place where you live?  Yes Need for family participation in patient care:  Yes (Comment)  Care giving concerns: Patient is a resident at [facility].    Social Worker assessment / plan: Holiday representative ([place]) met with patient and her daughter Christy Free ([place]) [phone] at bedside. Patient was asleep during assessment. Daughter reported that patient has been a resident at [facility] since February 2016. Per daughter patient has been getting physical therapy 2 to 3 times a week at ALF. Per daughter patient is wheel chair bound and uses a walker to transfer to wheel chair. Daughter reported that patient is usually alert and oriented and this confusion is new. Per daughter patient does not have a history of dementia. Patient has 4 children 2 daughters and 2 sons. Daughter Christy Free has HPOA and [name] is back up for HPOA. Daughter is agreeable for patient to return to [facility] at D/C.   CSW attempted to contact [facility] several times with no answer. CSW also left a message with Tammy the administrator at [facility]. FL2  complete and on chart. CSW will continue to follow and assist as needed.   Blima Rich, LCSWA [phone]  Employment status:  Retired Nurse, adult PT Recommendations:  Not assessed at this time Information / Referral to community resources:  Other (Comment Required) ([place] )  Patient/Family's Response to care: Patient's daughter Christy Free is agreeable for patient to return to [facility].   Patient/Family's Understanding of and Emotional Response to Diagnosis, Current Treatment, and Prognosis: Patient was asleep during assessment. Patient's daughter Christy Free is concerned about patient's confusion and wants to figure out what is wrong. Per daughter this confusion is very new. CSW provided emotional support and encouragement. CSW will continue to follow and assist as needed.   Blima Rich, LCSWA [phone]  Emotional Assessment Appearance:  Appears stated age Attitude/Demeanor/Rapport:  Lethargic, Unresponsive Affect (typically observed):  Unable to Assess Orientation:  Fluctuating Orientation (Suspected and/or reported Sundowners) Alcohol / Substance use:  Not Applicable Psych involvement (Current and /or in the community):  No (Comment)  Discharge Needs  Concerns to be addressed:  Discharge Planning Concerns Readmission within the last 30 days:  No Current discharge risk:  Chronically ill Barriers to Discharge:  Continued Medical Work up   Loralyn Freshwater, LCSW 09/12/2014, 10:20 AM

## 2014-09-12 NOTE — Progress Notes (Signed)
Pt is alert but confused and disoriented to time and situation. Admitted from [facility] with acute encephalopathy of unknown origin with confusion beginning 1.5 weeks ago and decreased LOC last night after supper around 1700 according to daugher. 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 VSS on room air. IV saline locked. Chronic pain from left hip surgery in November and right humerus fracture. Wheelchair bound at baseline, uses walker to pivot to chair. VSS, sleeping between care, daughter Sharee Pimple at bedside, will continue to monitor.

## 2014-09-13 ENCOUNTER — Inpatient Hospital Stay: Payer: PPO

## 2014-09-13 MED ORDER — DEXTROSE-NACL 5-0.45 % IV SOLN
INTRAVENOUS | Status: DC
  Administered 2014-09-13 – 2014-09-14 (×2): via INTRAVENOUS

## 2014-09-13 MED ORDER — CEFTRIAXONE SODIUM IN DEXTROSE 20 MG/ML IV SOLN
1.0000 g | Freq: Two times a day (BID) | INTRAVENOUS | Status: DC
  Administered 2014-09-14 – 2014-09-19 (×12): 1 g via INTRAVENOUS
  Filled 2014-09-13 (×20): qty 50

## 2014-09-13 NOTE — Progress Notes (Signed)
Patient resting . Family at bedside. Family inquiring about CT scan . Neurology to follow and will provide family with updates as data is received .

## 2014-09-13 NOTE — Progress Notes (Signed)
Per RN patient is not alert and not waking up. Patient is from [facility] and is wheel chair bound and can usually transfer herself per daughter. If patient needs SNF PT needs to be consulted and patient's Health [employer] insurance will have to authorize SNF stay. CSW will continue to follow and assist as needed.   Blima Rich, [place] [phone]

## 2014-09-13 NOTE — Progress Notes (Signed)
NEUROLOGY NOTE  S: Pt still states that she does not feel good.  Family is at bedside and re-assure me that this is subacute with worsening lately.   Pt usually cognitively intact even though she needs help with most ADLs.  ROS unobtainable due to confusion  O: AFVSS  Except for HTN  Mild distress, overweight Normocephalic, oropharynx clear supple, no JVD CTA B, no wheezing RRR, no murmur  Alert and oriented to person only, mild dysarthria, follows simple commands PERRLA, EOMI , face symmetric 5-/5 B, no tremor  EEG shows mild slowing  A/: 1. Encephalopathy-  No clear etiology but cxr is concerning for possible pneumonia and family states that she got the same way with an UTI about 3 weeks ago.  Pt could have recurrence of lymphoma that could do this as well but doubt it this acutely. -  CT of chest w/wo contrast -  Check UA again and blood cultures -  Start Rocephin 1gm q12h -  Continue IVF -  Will follow

## 2014-09-13 NOTE — Progress Notes (Signed)
Patient ID: [name], female   DOB: Jul 25, 1929, 81 y.o.   MRN: [id number] [place] [facility] [place] NAME: [name]    MR#:  [id number]  DATE OF BIRTH:  1930-04-23  SUBJECTIVE:  dter-in-law in the room. Pt quiet confused still, was able to tell her name after asking 2 times and still 'talking out of head'. Poor PO intake  REVIEW OF SYSTEMS:    ROS  Not obtainable as pt is with ams  Tolerating Diet: very little Tolerating PT: not done yet  DRUG ALLERGIES:   Allergies  Allergen Reactions  . Betadine [Povidone Iodine] Hives  . Sulfa Antibiotics   . Valium [Diazepam] Anxiety    VITALS:  Blood pressure 157/86, pulse 91, temperature 97.7 F (36.5 C), temperature source Oral, resp. rate 16, height _0  (1.549 m), weight 95.074 kg (209 lb 9.6 oz), SpO2 96 %.  PHYSICAL EXAMINATION:   Physical Exam  GENERAL:  82 y.o.-year-old patient lying in the bed with no acute distress.  EYES: Pupils equal, round, reactive to light and accommodation. No scleral icterus. Extraocular muscles intact.  HEENT: Head atraumatic, normocephalic. Oropharynx and nasopharynx clear.  NECK:  Supple, no jugular venous distention. No thyroid enlargement, no tenderness.  LUNGS: Normal breath sounds bilaterally, no wheezing, rales, rhonchi. No use of accessory muscles of respiration.  CARDIOVASCULAR: S1, S2 normal. No murmurs, rubs, or gallops.  ABDOMEN: Soft, nontender, nondistended. Bowel sounds present. No organomegaly or mass.  EXTREMITIES: No cyanosis, clubbing or edema b/l.    NEUROLOGIC: unable to assess due to MS changes PSYCHIATRIC:  patient is drowsy and trying to stay awake SKIN: No obvious rash, lesion, or ulcer.    LABORATORY PANEL:   CBC  Recent Labs Lab 09/12/14 0647  WBC 7.5  HGB 12.8  HCT 39.0  PLT 208    ------------------------------------------------------------------------------------------------------------------  Chemistries   Recent Labs Lab 09/11/14 2233 09/12/14 0647  NA 138  --   K 4.1  --   CL 97*  --   CO2 30  --   GLUCOSE 111*  --   BUN 19  --   CREATININE 0.92 0.89  CALCIUM 8.7*  --   AST 22  --   ALT 21  --   ALKPHOS 72  --   BILITOT 0.6  --    ------------------------------------------------------------------------------------------------------------------  Cardiac Enzymes  Recent Labs Lab 09/11/14 2233  TROPONINI 0.03   ------------------------------------------------------------------------------------------------------------------  RADIOLOGY:  Ct Head Wo Contrast  09/11/2014   CLINICAL DATA:  Altered mental status. Decreased level of consciousness.  EXAM: CT HEAD WITHOUT CONTRAST  TECHNIQUE: Contiguous axial images were obtained from the base of the skull through the vertex without intravenous contrast.  COMPARISON:  07/06/2014  FINDINGS: No intracranial hemorrhage, mass effect, or midline shift. No hydrocephalus. The basilar cisterns are patent. No evidence of territorial infarct. No intracranial fluid collection. Central atrophy and chronic small vessel ischemic change, stable from prior exam. Calvarium is intact. Included paranasal sinuses and mastoid air cells are well aerated.  IMPRESSION: 1.  No acute intracranial abnormality. 2. Unchanged atrophy and chronic small vessel ischemic change.   Electronically Signed   By: Jeb Levering M.D.   On: 09/11/2014 22:56   Mr Jeri Cos LN Contrast  09/12/2014   CLINICAL DATA:  Altered mental status  EXAM: MRI HEAD WITHOUT AND WITH CONTRAST  TECHNIQUE: Multiplanar, multiecho pulse sequences of the brain and surrounding structures were obtained without and with intravenous  contrast.  CONTRAST:  22m MULTIHANCE GADOBENATE DIMEGLUMINE 529 MG/ML IV SOLN  COMPARISON:  CT head 09/11/2014.  MRI 11/29/2012  FINDINGS:  Image quality degraded by motion. Fast scanning protocol was performed due to motion.  Moderate atrophy. Ventricular enlargement unchanged from prior CT and MRI scans. The ventricular enlargement most consistent with atrophy.  Negative for acute infarct.  Negative for hemorrhage or fluid collection  Negative for mass or edema.  Periventricular white matter hyperintensity is patchy and most consistent with chronic microvascular ischemia. Mild progression since 2014.  Normal enhancement following contrast infusion.  IMPRESSION: Image quality degraded by moderate motion  Generalized atrophy. Chronic microvascular ischemic change in the white matter.  Negative for acute infarct or mass.   Electronically Signed   By: CFranchot GalloM.D.   On: 09/12/2014 14:44   Dg Chest Port 1 View  09/11/2014   CLINICAL DATA:  Weakness, confusion, leg swelling, clinical concern for CHF.  EXAM: PORTABLE CHEST - 1 VIEW  COMPARISON:  07/09/2014  FINDINGS: Lower lung volumes from prior exam. Stable borderline mild cardiomegaly. No pulmonary edema. Ill-defined densities the left lung base. No pneumothorax. Degenerative change in both shoulders. No acute osseous abnormalities are seen.  IMPRESSION: 1. Ill-defined densities at the left lung base, may reflect atelectasis or be related to soft tissue attenuation. Small pleural effusion or minimal pneumonia could have a similar appearance. 2. No radiographic findings of CHF.   Electronically Signed   By: MJeb LeveringM.D.   On: 09/11/2014 23:05     ASSESSMENT AND PLAN:   1. Acute encephalopathy. Unclear etiology/ can be UTI Per patient's daughter-in-law  she is noted to have intermittent episodes of decreased mentation/confusion for the past one and half weeks. Etiology unclear. All labs normal, CT head and MRI  negative for acute IC pathology, no history of fever and patient does not look septic  -TSH,B12 are nml, ESR elevated -on empiric abx rocephin -? Related to chronic pain  medications and trazodone. -EEG done ,results are pending -Gentle hydration with ivf 2/2 poor PO intake 2. Hypertension, stable on home medications.  3. History of hypothyroidism, on levothyroxine.  -TSH normal  4. Atrial fibrillation with controlled ventricular rate, likely chronic. No acute problems at present. Per patient's daughter, patient refused cardiac workup in the past.  5. Chronic backache/spinal stenosis, patient on chronic pain meds when necessary. We will hold off chronic pain medications for now, continue Tylenol.  6. History of lymphoma, no acute problems. Will consider onco consult if needed   Case discussed with Care Management/Social Worker. Management plans discussed with the patient, daughter -in - law and they are in agreement.  CODE STATUS DNR  DVT Prophylaxis: lovenox  TOTAL TIME TAKING CARE OF THIS PATIENT: 30 minutes.    D/C  DEPENDING ON CLINICAL CONDITION.   GNicholes MangoM.D on 09/13/2014 at 8:20 PM  Between 7am to 6pm - Pager - [phone]  After 6pm go to [email or web address] - password [email or web address]  Office  [phone] CC: Primary care physician; KLavera Guise MD

## 2014-09-13 NOTE — Progress Notes (Signed)
Patient continues to be confused today. Not eating or drinking well. Had EEG this morning/results have not been posted. She is on room air, IV running D5 1/2 NS at 165mL/hr for 2 L only.  She is from [facility].

## 2014-09-13 NOTE — Progress Notes (Signed)
Chaplain met with patient and family members, chaplain offered encouragement. Loralyn Freshwater D. Alroy Dust Thursday 09-13-2014   09/13/14 2025  Clinical Encounter Type  Visited With Patient and family together  Visit Type Initial  Referral From Nurse  Consult/Referral To Chaplain  Spiritual Encounters  Spiritual Needs Emotional  Stress Factors  Patient Stress Factors Health changes  Family Stress Factors Health changes

## 2014-09-14 ENCOUNTER — Inpatient Hospital Stay: Payer: PPO

## 2014-09-14 DIAGNOSIS — R410 Disorientation, unspecified: Secondary | ICD-10-CM

## 2014-09-14 LAB — CBC
HEMATOCRIT: 40.6 % (ref 35.0–47.0)
Hemoglobin: 13.1 g/dL (ref 12.0–16.0)
MCH: 29.2 pg (ref 26.0–34.0)
MCHC: 32.3 g/dL (ref 32.0–36.0)
MCV: 90.2 fL (ref 80.0–100.0)
Platelets: 216 10*3/uL (ref 150–440)
RBC: 4.5 MIL/uL (ref 3.80–5.20)
RDW: 15.2 % — AB (ref 11.5–14.5)
WBC: 8.5 10*3/uL (ref 3.6–11.0)

## 2014-09-14 LAB — HIGH SENSITIVITY CRP: CRP, High Sensitivity: 18.44 mg/L — ABNORMAL HIGH (ref 0.00–3.00)

## 2014-09-14 MED ORDER — DOCUSATE SODIUM 100 MG PO CAPS
100.0000 mg | ORAL_CAPSULE | Freq: Two times a day (BID) | ORAL | Status: DC
  Administered 2014-09-14 – 2014-09-20 (×12): 100 mg via ORAL
  Filled 2014-09-14 (×12): qty 1

## 2014-09-14 MED ORDER — DEXTROSE 5 % IV SOLN
250.0000 mg | INTRAVENOUS | Status: DC
  Administered 2014-09-15 – 2014-09-18 (×4): 250 mg via INTRAVENOUS
  Filled 2014-09-14 (×6): qty 250

## 2014-09-14 MED ORDER — OXYCODONE HCL 5 MG PO TABS: 5.0000 mg | ORAL_TABLET | Freq: Two times a day (BID) | ORAL | Status: DC | PRN

## 2014-09-14 MED ORDER — POLYETHYLENE GLYCOL 3350 17 G PO PACK
17.0000 g | PACK | Freq: Every day | ORAL | Status: DC | PRN
Start: 2014-09-14 — End: 2014-09-20

## 2014-09-14 MED ORDER — DEXTROSE 5 % IV SOLN
500.0000 mg | Freq: Once | INTRAVENOUS | Status: AC
  Administered 2014-09-14: 500 mg via INTRAVENOUS
  Filled 2014-09-14: qty 500

## 2014-09-14 MED ORDER — RISPERIDONE 0.5 MG PO TBDP
0.5000 mg | ORAL_TABLET | Freq: Every evening | ORAL | Status: DC | PRN
  Administered 2014-09-14 – 2014-09-16 (×3): 0.5 mg via ORAL
  Filled 2014-09-14 (×5): qty 1

## 2014-09-14 NOTE — Progress Notes (Signed)
Clinical Social Worker (CSW) met with patient's daughter Sharee Pimple to discuss D/C plan. Per daughter patient is at baseline in mobility and was requiring assistance getting from bed to wheel chair. Daughter refused SNF and wants patient to return to [facility]. Per MD patient will likely D/C tomorrow. CSW contacted [place] at [facility] and made her aware of above. Per [place] CSW can call [phone] and fax D/C Summary to [phone]. Daughter reported that they will provide transport depending on how weak patient is. CSW will continue to follow and assist as needed.   Blima Rich, [place] [phone]

## 2014-09-14 NOTE — Outcomes Assessment (Signed)
VSS, patient is alert, iv fluids infusing and patient denies pain.  Family in at bedside.  Patient is incontinent of urine. Turned every 2 hours.

## 2014-09-14 NOTE — Consult Note (Signed)
[facility] [facility] Consult   Reason for Consult:  Consult for possible dementia inpatient with altered mental status Referring Physician:  Volanda Napoleon Patient Identification: [name] MRN:  [id number] Principal Diagnosis: Acute delirium Diagnosis:   Patient Active Problem List   Diagnosis Date Noted  . Acute delirium [R41.0] 09/14/2014  . Acute encephalopathy [G93.40] 09/12/2014  . HTN (hypertension) [I10] 09/12/2014  . Hypothyroidism [E03.9] 09/12/2014  . A-fib [I48.91] 09/12/2014  . Encephalopathy acute [G93.40] 09/12/2014  . Diplopia [H53.2] 12/07/2012    Total Time spent with patient: 1 hour  Subjective:   [name] is a 81 y.o. female patient admitted with patient admitted with acute mental status changes her chief complaint "I lost my memory".  HPI:  Information obtained from the patient and the chart as well as conversation with the patient's daughter. This 9 year old woman with no known past psychiatric history was admitted with altered mental status. The daughter describes a couple days of waxing and waning mental state that was clearly different from her baseline. Patient in the hospital has also had waxing and waning presentations at times being very confused and delirious. On interview today the patient says that she knows that she lost her memory but that she feels like she's gotten some of that back. She is not able to give me much more information about her living situation that is accurate. She denies that she is feeling sad and depressed. Interestingly she does say that she thinks about death at times and feels like she is ready to go home to her heavenly father but denies having any thoughts about actually wanting to kill herself. There is evidently no history to suggest that there was new medication or specific illnesses was in her mental status change.  No past psychiatric history. No known past history of depression no psychiatric hospitalizations no  history of suicide attempts.  Substance abuse history: No history of alcohol or drug abuse  Family history none known  Social history: The patient resides at the [facility]. She has 4 adult children. Her daughter reports that up until last week the patient did not appear to have significant memory problems at all.  On mental status the patient had a remarkably difficult time just repeating 3 words. I don't think it was because of hearing as I repeated the words slowly and loudly several times. She would get dysarthric and jumble of the words. At 3 minutes she could remember one word. She knew she [facility] but did not know the correct year and was not able to tell me the month. I did not do a full Mini-Mental status examination she told me she was not able to use her hands. I did ask her to spell a word backwards and she jumbled it up completely although she got the correct letters in it.   HPI Elements:   Quality:  Confusion and memory loss. Severity:  Moderate to severe. Timing:  Recent and over just the last few days. Duration:  May be starting to resolve. Context:  No known specific context.  Past Medical History:  Past Medical History  Diagnosis Date  . Hypertension   . Thyroid disorder   . Lymphoma   . Hypothyroidism   . Arthritis   . Diverticulosis   . Obstructive sleep apnea   . Myasthenia gravis   . Hip fracture, left   . Fracture of right humerus     Past Surgical History  Procedure Laterality Date  . Nm pet lymphoma    . Hernia repair    . Cholecystectomy    . Back surgery     Family History:  Family History  Problem Relation Age of Onset  . Heart Problems Mother   . Cancer Father    Social History:  History  Alcohol Use No     History  Drug Use No    History   Social History  . Marital Status: Widowed    Spouse Name: N/A  . Number of Children: 4  . Years of Education: 12   Occupational History  . Stay at  home mother    Social History Main Topics  . Smoking status: Never Smoker   . Smokeless tobacco: Never Used  . Alcohol Use: No  . Drug Use: No  . Sexual Activity: Not on file   Other Topics Concern  . None   Social History Narrative   Patient lives at home with son.   Patient has four children.   Patient is retired.   Patient has a high school education.   Patient is right-handed.   Patient drinks very little caffeine.    Additional Social History:                          Allergies:   Allergies  Allergen Reactions  . Betadine [Povidone Iodine] Hives  . Sulfa Antibiotics   . Valium [Diazepam] Anxiety    Labs:  Results for orders placed or performed during the hospital encounter of 09/11/14 (from the past 48 hour(s))  Folate     Status: None   Collection Time: 09/12/14  7:19 PM  Result Value Ref Range   Folate 27.0 >5.9 ng/mL  Ammonia     Status: None   Collection Time: 09/12/14  7:19 PM  Result Value Ref Range   Ammonia 16 9 - 35 umol/L  Sedimentation rate     Status: Abnormal   Collection Time: 09/12/14  7:19 PM  Result Value Ref Range   Sed Rate 44 (H) 0 - 30 mm/hr  High sensitivity CRP     Status: Abnormal   Collection Time: 09/12/14  7:19 PM  Result Value Ref Range   CRP, High Sensitivity 18.44 (H) 0.00 - 3.00 mg/L    Comment: (NOTE)         Relative Risk for Future Cardiovascular Event                             Low                 <1.00                             Average       1.00 - 3.00                             High                >3.00 Performed At: [facility] [place], [place] [id number] Lindon Romp MD IO:[id number]   TSH     Status: None   Collection Time: 09/12/14  7:19 PM  Result Value Ref Range   TSH 2.645 0.350 - 4.500 uIU/mL  Culture, blood (routine x 2)  Status: None (Preliminary result)   Collection Time: 09/13/14  7:50 PM  Result Value Ref Range   Specimen Description BLOOD    Special  Requests Normal    Culture NO GROWTH < 12 HOURS    Report Status PENDING   Culture, blood (routine x 2)     Status: None (Preliminary result)   Collection Time: 09/13/14  7:50 PM  Result Value Ref Range   Specimen Description BLOOD    Special Requests Normal    Culture NO GROWTH < 12 HOURS    Report Status PENDING   CBC     Status: Abnormal   Collection Time: 09/14/14  4:42 AM  Result Value Ref Range   WBC 8.5 3.6 - 11.0 K/uL   RBC 4.50 3.80 - 5.20 MIL/uL   Hemoglobin 13.1 12.0 - 16.0 g/dL   HCT 40.6 35.0 - 47.0 %   MCV 90.2 80.0 - 100.0 fL   MCH 29.2 26.0 - 34.0 pg   MCHC 32.3 32.0 - 36.0 g/dL   RDW 15.2 (H) 11.5 - 14.5 %   Platelets 216 150 - 440 K/uL    Vitals: Blood pressure 139/94, pulse 86, temperature 97.5 F (36.4 C), temperature source Oral, resp. rate 18, height 5\' 1"  (1.549 m), weight 93.622 kg (206 lb 6.4 oz), SpO2 95 %.  Risk to Self: Is patient at risk for suicide?: No Risk to Others:   Prior Inpatient Therapy:   Prior Outpatient Therapy:    Current Facility-Administered Medications  Medication Dose Route Frequency Provider Last Rate Last Dose  . acetaminophen (TYLENOL) tablet 650 mg  650 mg Oral Q6H PRN Juluis Mire, MD       Or  . acetaminophen (TYLENOL) suppository 650 mg  650 mg Rectal Q6H PRN Juluis Mire, MD      . aspirin EC tablet 81 mg  81 mg Oral Daily Juluis Mire, MD   81 mg at 09/14/14 0939  . atenolol (TENORMIN) tablet 25 mg  25 mg Oral Daily Juluis Mire, MD   25 mg at 09/14/14 5456  . [START ON 09/15/2014] azithromycin (ZITHROMAX) 250 mg in dextrose 5 % 125 mL IVPB  250 mg Intravenous Q24H Aruna Gouru, MD      . benazepril (LOTENSIN) tablet 20 mg  20 mg Oral Daily Juluis Mire, MD   20 mg at 09/14/14 2563   And  . hydrochlorothiazide (MICROZIDE) capsule 12.5 mg  12.5 mg Oral Daily Juluis Mire, MD   12.5 mg at 09/14/14 8937  . cefTRIAXone (ROCEPHIN) 1 g in dextrose 5 % 50 mL IVPB - Premix  1 g Intravenous Q12H Valora Corporal, MD   1 g at 09/14/14 0939  . dextrose 5 %-0.45 % sodium chloride infusion   Intravenous Continuous Nicholes Mango, MD 100 mL/hr at 09/14/14 0508    . docusate sodium (COLACE) capsule 100 mg  100 mg Oral BID Nicholes Mango, MD   100 mg at 09/14/14 1742  . enoxaparin (LOVENOX) injection 40 mg  40 mg Subcutaneous Q24H Juluis Mire, MD   40 mg at 09/14/14 0315  . levothyroxine (SYNTHROID, LEVOTHROID) tablet 75 mcg  75 mcg Oral QAC breakfast Juluis Mire, MD   75 mcg at 09/13/14 0855  . ondansetron (ZOFRAN) tablet 4 mg  4 mg Oral Q6H PRN Juluis Mire, MD       Or  . ondansetron [facility]) injection 4 mg  4 mg Intravenous Q6H PRN Ulice Brilliant  Reece Levy, MD      . oxyCODONE (Oxy IR/ROXICODONE) immediate release tablet 5 mg  5 mg Oral BID PRN Nicholes Mango, MD      . polyethylene glycol (MIRALAX / GLYCOLAX) packet 17 g  17 g Oral Daily PRN Nicholes Mango, MD        Musculoskeletal: Strength & Muscle Tone: decreased Gait & Station: unable to stand Patient leans: N/A  Psychiatric Specialty Exam: Physical Exam  HENT:  Head: Normocephalic.  Eyes: Pupils are equal, round, and reactive to light.  Neurological: She is alert.  Psychiatric: Her behavior is normal. Judgment and thought content normal. Her affect is blunt. Her speech is delayed. Cognition and memory are impaired. She exhibits abnormal recent memory and abnormal remote memory.    Review of Systems  Constitutional: Negative.   HENT: Negative.   Eyes: Negative.   Respiratory: Negative.   Cardiovascular: Negative.   Gastrointestinal: Negative.   Musculoskeletal: Negative.   Skin: Negative.   Neurological: Negative.   Psychiatric/Behavioral: Positive for memory loss. Negative for depression, suicidal ideas, hallucinations and substance abuse. The patient is not nervous/anxious and does not have insomnia.     Blood pressure 139/94, pulse 86, temperature 97.5 F (36.4 C), temperature source Oral, resp. rate 18, height 5\' 1"  (1.549 m),  weight 93.622 kg (206 lb 6.4 oz), SpO2 95 %.Body mass index is 39.02 kg/(m^2).  General Appearance: Fairly Groomed  Engineer, water::  Good  Speech:  Slow  Volume:  Decreased  Mood:  Euthymic  Affect:  Constricted  Thought Process:  Tangential  Orientation:  Other:  See note above. She knew where she was but not the correct date  Thought Content:  Negative  Suicidal Thoughts:  No  Homicidal Thoughts:  No  Memory:  Immediate;   Poor Recent;   Fair Remote;   Poor  Judgement:  Impaired  Insight:  Lacking  Psychomotor Activity:  Decreased  Concentration:  Poor  Recall:  Poor  Fund of Knowledge:Poor  Language: Poor  Akathisia:  No  Handed:  Right  AIMS (if indicated):     Assets:  Housing Social Support  ADL's:  Impaired  Cognition: Impaired,  Moderate  Sleep:      Medical Decision Making: Review of Psycho-Social Stressors (1), New Problem, with no additional work-up planned (3), Review or order medicine tests (1) and Review of Medication Regimen & Side Effects (2)  Treatment Plan Summary: Plan Patient who appears to have an encephalopathy or delirium of unclear etiology. On that account I don't think I have anything to add other than what the neurologist has already seen. As far as a question of underlying dementia I can only go by the family's testimony that until a week ago the patient was in a very good mental state and did not appear to have a significantly impairing degree of dementia. This would suggest that it is less likely that what we are seeing is Alzheimer's disease although it is possible for things to take a sudden turn for the worse. Probably the next most likely would be vascular or infectious causes. It is a little difficult to declare how "demented" someone will be in a lasting way when they are still acutely delirious. I would not suggest any specific treatment for dementia at this point as it is not likely to help in the acute phase anyway. I will check by to see how she  progresses. I have added an order for a small amount of Risperdal at night  if she is starting to "sundowning" or having a great deal of trouble sleeping  Plan:  Patient does not meet criteria for psychiatric inpatient admission. Supportive therapy provided about ongoing stressors. Discussed crisis plan, support from social network, calling 911, coming to the Emergency Department, and calling Suicide Hotline. Disposition: See note above  CLAPACS, [facility] 09/14/2014 6:24 PM

## 2014-09-14 NOTE — Progress Notes (Signed)
Patient was more alert at beginning of shift and was able to communicate more clearly.  During shift and towards afternoon patient began to slur during communication. Dr. Tamala Julian (neuro) was present to witness and further tests have been ordered.  No complaints of pain.  Did get patient up to [facility] couple of times but during day progressively got more difficult.  Dr. Margaretmary Eddy ordered a second urine sample and family stated that they did not want to have it done simply because Dr. Margaretmary Eddy advised that since the patient was getting antibiotics the results would be skewed therefore they felt it was an unnecessary expense.  All family members very involved with patient care.

## 2014-09-14 NOTE — Evaluation (Signed)
Physical Therapy Evaluation Patient Details Name: [name] MRN: [id number] DOB: 1930-04-06 Today's Date: 09/14/2014   History of Present Illness  presented to ER from ALF secondary to decreased alertness, AMS x1-2 weeks; admitted with acute encephalopathy of unknown etiology (? PNA ?)  Clinical Impression  Upon evaluation, patient alert and oriented to self, location, month and year; however, at times, unable to correctly and sensibly answer questions, unable to follow topic of conversation.  Family required to provide reliable history.  Bilat UE/LE strength and ROM grossly WFL for basic transfers and mobility, except bilat shoulder elevation with significant ROM limitations due to previous RTC injury.  Patient currently requiring max assist for supine/sit; min assist for sit/stand and bed/chair transfer with RW.  Very short, choppy steps with limited balance reactions.  Unsafe to complete without RW and +1 at all times. Would benefit from skilled PT to address above deficits and promote optimal return to PLOF; recommend transition to STR upon discharge from acute hospitalization. If ALF able to provide hands-on +1 assist with ALL mobility, okay for return there with HHPT upon discharge.  Social work informed/aware.   Follow Up Recommendations SNF    Equipment Recommendations       Recommendations for Other Services       Precautions / Restrictions Precautions Precautions: Fall Precaution Comments: No BP R UE Restrictions Weight Bearing Restrictions: No      Mobility  Bed Mobility Overal bed mobility: Needs Assistance Bed Mobility: Supine to Sit     Supine to sit: Max assist        Transfers Overall transfer level: Needs assistance Equipment used: Rolling walker (2 wheeled) Transfers: Sit to/from Stand Sit to Stand: Min assist         General transfer comment: tends to pull up on RW  Ambulation/Gait Ambulation/Gait assistance: Min assist Ambulation Distance  (Feet): 5 Feet Assistive device: Rolling walker (2 wheeled)       General Gait Details: 3 point, step to gait pattern; trendelenburg gait pattern with marked weakness of L posterior/lateral hip musculature.  Impaired balance; unsafe to attempt without +1 assist at this time  Stairs            Wheelchair Mobility    Modified Rankin (Stroke Patients Only)       Balance Overall balance assessment: Needs assistance Sitting-balance support: No upper extremity supported Sitting balance-Leahy Scale: Good     Standing balance support: Bilateral upper extremity supported Standing balance-Leahy Scale: Fair                               Pertinent Vitals/Pain Pain Assessment: Faces Faces Pain Scale: Hurts little more Pain Location: R shoulder Pain Descriptors / Indicators: Aching Pain Intervention(s): Repositioned;Limited activity within patient's tolerance    Home Living Family/patient expects to be discharged to:: Assisted living               Home Equipment: Walker - 2 wheels;Wheelchair - manual      Prior Function Level of Independence: Needs assistance   Gait / Transfers Assistance Needed: Minimal in-room mobility with RW and assist from staff as needed; able to transfer bed/chair indep  ADL's / Homemaking Assistance Needed: Assist from staff for all ADLs  Comments: Family denies fall history. Family reports patient oriented and cognitively intact at baseline.     Hand Dominance        Extremity/Trunk Assessment   Upper Extremity  Assessment: Generalized weakness (bilat UE shoulder elevation limited to approx 20 degrees actively (previous RTC injury); elbows, wrists and grasp grossly WFL for ROM, 3/5 strength)           Lower Extremity Assessment: Generalized weakness (ROM grossly WFL and symmetrical; strength at least 4/5)         Communication   Communication:  (patient with clear speech, but non-sensical and jargon-ish at times)   Cognition Arousal/Alertness: Awake/alert Behavior During Therapy: WFL for tasks assessed/performed Overall Cognitive Status: Impaired/Different from baseline Area of Impairment: Orientation;Memory;Following commands;Safety/judgement;Problem solving Orientation Level: Disoriented to;Time;Situation   Memory: Decreased short-term memory Following Commands: Follows one step commands inconsistently (approx 50-75% times) Safety/Judgement: Decreased awareness of deficits;Decreased awareness of safety   Problem Solving: Decreased initiation;Difficulty sequencing;Requires verbal cues;Requires tactile cues      General Comments      Exercises Other Exercises Other Exercises: Sit/stand from recliner with RW, min assist (pulls up on RW).  Static standing at RW, min assist--delayed response to external perturbation.  Patient with incontinent bladder episode; dep for hygiene and undergarment change. (8 minutes)      Assessment/Plan    PT Assessment Patient needs continued PT services  PT Diagnosis Difficulty walking;Generalized weakness   PT Problem List Decreased strength;Decreased range of motion;Decreased activity tolerance;Decreased balance;Decreased mobility;Decreased cognition;Decreased knowledge of use of DME;Decreased safety awareness;Decreased knowledge of precautions;Cardiopulmonary status limiting activity;Obesity;Pain  PT Treatment Interventions DME instruction;Gait training;Functional mobility training;Therapeutic exercise;Therapeutic activities;Balance training;Cognitive remediation;Patient/family education   PT Goals (Current goals can be found in the Care Plan section) Acute Rehab PT Goals Patient Stated Goal: patient unable to verbalize PT Goal Formulation: With patient Time For Goal Achievement: 09/28/14 Potential to Achieve Goals: Fair    Frequency Min 2X/week   Barriers to discharge        Co-evaluation               End of Session Equipment Utilized During  Treatment: Gait belt Activity Tolerance: Patient tolerated treatment well Patient left: in chair;with call bell/phone within reach;with chair alarm set;with family/visitor present           Time: 1407-1430 PT Time Calculation (min) (ACUTE ONLY): 23 min   Charges:   PT Evaluation $Initial PT Evaluation Tier I: 1 Procedure PT Treatments $Therapeutic Activity: 8-22 mins   PT G Codes:       [place] Krotz H. Owens Shark, PT, DPT 09/14/2014, 2:58 PM [phone]

## 2014-09-14 NOTE — Progress Notes (Signed)
Patient ID: [name], female   DOB: [birth year], 81 y.o.   MRN: [id number] [facility] [place] NAME: [name]    MR#:  [id number]  DATE OF BIRTH:  03/24/1930  SUBJECTIVE:  Son and dter-in-law in the room. Pt is more awake and alert today, was able to answer most of the questions. Better by mouth intake per RN report  REVIEW OF SYSTEMS:    ROS Constitutional: Negative for fever, chills and weight loss.  HENT: Negative for ear discharge, ear pain and nosebleeds.  Eyes: Negative for blurred vision, pain and discharge.  Respiratory: Negative for sputum production, wheezing and stridor.  Cardiovascular: Negative for chest pain, palpitations, orthopnea and PND.  Gastrointestinal: Negative for nausea, vomiting, complaining of left lower quadrant abdominal pain and no diarrhea.  Genitourinary: Negative for urgency and frequency.  Musculoskeletal: Negative for back pain and joint pain.  Neurological: Positive for weakness. Negative for sensory change, speech change and focal weakness.  Psychiatric/Behavioral: Negative for depression. The patient is not nervous/anxious.  All other systems reviewed and are negative. Not obtainable as pt is with ams  Tolerating Diet:  little Tolerating PT: not done yet  DRUG ALLERGIES:   Allergies  Allergen Reactions  . Betadine [Povidone Iodine] Hives  . Sulfa Antibiotics   . Valium [Diazepam] Anxiety    VITALS:  Blood pressure 152/83, pulse 106, temperature 97.8 F (36.6 C), temperature source Oral, resp. rate 18, height _0  (1.549 m), weight 93.622 kg (206 lb 6.4 oz), SpO2 96 %.  PHYSICAL EXAMINATION:   Physical Exam  GENERAL:  81 y.o.-year-old patient lying in the bed with no acute distress.  EYES: Pupils equal, round, reactive to light and accommodation. No scleral icterus. Extraocular muscles intact.  HEENT: Head atraumatic, normocephalic. Oropharynx and  nasopharynx clear.  NECK:  Supple, no jugular venous distention. No thyroid enlargement, no tenderness.  LUNGS: Normal breath sounds bilaterally, no wheezing, rales, rhonchi. No use of accessory muscles of respiration.  CARDIOVASCULAR: S1, S2 normal. No murmurs, rubs, or gallops.  ABDOMEN: Soft, nontender, nondistended. Bowel sounds present. No organomegaly or mass.  EXTREMITIES: No cyanosis, clubbing or edema b/l.    NEUROLOGIC: Awake, alert, oriented 2, following verbal commands, reflexes are 2+ PSYCHIATRIC:  patient is drowsy and trying to stay awake SKIN: No obvious rash, lesion, or ulcer.    LABORATORY PANEL:   CBC  Recent Labs Lab 09/14/14 0442  WBC 8.5  HGB 13.1  HCT 40.6  PLT 216   ------------------------------------------------------------------------------------------------------------------  Chemistries   Recent Labs Lab 09/11/14 2233 09/12/14 0647  NA 138  --   K 4.1  --   CL 97*  --   CO2 30  --   GLUCOSE 111*  --   BUN 19  --   CREATININE 0.92 0.89  CALCIUM 8.7*  --   AST 22  --   ALT 21  --   ALKPHOS 72  --   BILITOT 0.6  --    ------------------------------------------------------------------------------------------------------------------  Cardiac Enzymes  Recent Labs Lab 09/11/14 2233  TROPONINI 0.03   ------------------------------------------------------------------------------------------------------------------  RADIOLOGY:  Ct Chest Wo Contrast  09/14/2014   CLINICAL DATA:  81 year old female with abnormal left lung base. Confusion. Personal history of lymphoma. Initial encounter.  EXAM: CT CHEST WITHOUT CONTRAST  TECHNIQUE: Multidetector CT imaging of the chest was performed following the standard protocol without IV contrast.  COMPARISON:  Portable chest radiographs 09/11/2014 and earlier. CT Abdomen and Pelvis  05/17/2014. Chest CT 06/09/2013.  FINDINGS: Moderate bilateral layering pleural effusions are new since January. Superimposed  bilateral compressive pulmonary atelectasis. Indistinctness of bilateral pulmonary vasculature. Atelectatic changes to the perihilar airways. Trachea and mainstem bronchi are patent.  No air bronchograms. 5-6 mm left lower lobe pulmonary nodule on series 3, image 24 is stable since February 2015. New nonspecific and indistinct 4-5 mm subpleural nodule nearby a associated with the left major fissure on image 17. This might be a reactive intrapleural node in this setting.  Cardiomegaly. No pericardial effusion. Calcified coronary atherosclerosis. No hilar or mediastinal lymphadenopathy. Negative non contrast thoracic inlet.  Advanced degenerative changes about the right shoulder including dystrophic calcifications. No axillary lymphadenopathy.  Negative visualized noncontrast liver, spleen, pancreas, adrenal glands, left kidney, and bowel in the upper abdomen. Surgically absent gallbladder.  Osteopenia and degenerative changes in the spine. Mild scoliosis. Advanced degenerative changes at the right shoulder. No acute osseous abnormality identified.  IMPRESSION: 1. Moderate layering pleural effusions with compressive atelectasis. Indistinct appearance of pulmonary vasculature, consider interstitial edema. 2. 5-6 mm left lower lobe lung nodule as described on 06/09/2013 is stable. There is a nearby a small 4-5 mm pleural based nodule which appears new but is probably either an intrapleural node or minimal trapped pleural fluid in this setting. 3. Chronic cardiomegaly without pericardial effusion. Calcified coronary atherosclerosis.   Electronically Signed   By: Genevie Ann M.D.   On: 09/14/2014 11:53   Mr Jeri Cos GU Contrast  09/12/2014   CLINICAL DATA:  Altered mental status  EXAM: MRI HEAD WITHOUT AND WITH CONTRAST  TECHNIQUE: Multiplanar, multiecho pulse sequences of the brain and surrounding structures were obtained without and with intravenous contrast.  CONTRAST:  15m MULTIHANCE GADOBENATE DIMEGLUMINE 529 MG/ML IV  SOLN  COMPARISON:  CT head 09/11/2014.  MRI 11/29/2012  FINDINGS: Image quality degraded by motion. Fast scanning protocol was performed due to motion.  Moderate atrophy. Ventricular enlargement unchanged from prior CT and MRI scans. The ventricular enlargement most consistent with atrophy.  Negative for acute infarct.  Negative for hemorrhage or fluid collection  Negative for mass or edema.  Periventricular white matter hyperintensity is patchy and most consistent with chronic microvascular ischemia. Mild progression since 2014.  Normal enhancement following contrast infusion.  IMPRESSION: Image quality degraded by moderate motion  Generalized atrophy. Chronic microvascular ischemic change in the white matter.  Negative for acute infarct or mass.   Electronically Signed   By: CFranchot GalloM.D.   On: 09/12/2014 14:44     ASSESSMENT AND PLAN:   1. Acute encephalopathy. can be acute viral syndrome with dehydration, rule out UTI Per patient's daughter-in-law  she is noted to have intermittent episodes of decreased mentation/confusion for the past one and half weeks.  All labs are normal including urinalysis.  CT head and MRI  negative for acute IC pathology, no history of fever and patient does not look septic  -TSH,B12 are nml, ESR and CRP elevated -on empiric abx rocephin and azithromycin, clinically improving but not completely at her baseline CT chest is ordered-which has revealed moderate pleural effusion and pulmonary nodule but no pneumonia -? Related to chronic pain medications and trazodone -currently trazodone on hold, resuming oxycodone 5 mg every 12 hours as needed as per daughter's request -EEG -slow response -DC'd IV fluids and encouraged  PO intake -Psychiatric consult is added as there is a concern about underlying dementia   2. Hypertension, stable on home medications.  3. History of hypothyroidism,  on levothyroxine.  -TSH normal  4. Atrial fibrillation with controlled  ventricular rate, likely chronic. No acute problems at present. Per patient's daughter, patient refused cardiac workup in the past.  5. Chronic backache/spinal stenosis, patient on chronic pain meds when necessary. We will hold off chronic pain medications for now, continue Tylenol.  6. History of lymphoma, no acute problems. onco consult is placed to Dr. Cynda Acres as patient was not seen by him for the past 8 years  73. Gen weakness- PT consult is placed   Case discussed with Care Management/Social Worker. Management plans discussed with the patient, daughter -in - law and they are in agreement.  CODE STATUS DNR  DVT Prophylaxis: lovenox  TOTAL TIME TAKING CARE OF THIS PATIENT: 30 minutes.    D/C  DEPENDING ON CLINICAL CONDITION.   Nicholes Mango M.D on 09/14/2014 at 2:19 PM  Between 7am to 6pm - Pager - [phone]  After 6pm go to [email or web address] - password [email or web address] [facility]  Office  [phone]  CC: Primary care physician; Lavera Guise, MD

## 2014-09-14 NOTE — Progress Notes (Signed)
NEUROLOGY NOTE  S: Improvement earlier today per nursing but now back to confused state.  ROS unobtainable due to confusion  O: AFVSS Except for HTN  Mild distress, overweight Normocephalic, oropharynx clear supple, no JVD CTA B, no wheezing RRR, no murmur  Alert and oriented to person only, moderate dysarthria, follows simple commands PERRLA, EOMI , face symmetric 5-/5 B, no tremor  EEG shows mild slowing  A/: 1. Encephalopathy- still fluctuating, concern for possible paraneoplastic syndrome vs. Autoimmune;  Infection lower on differential now due to lack of response - Check ana, thyroglobulin ab -  Needs paraneoplastic panel but unsure where to find this -  May benefit from round of steroids - continue Rocephin 1gm q12h - Continue IVF - Will follow

## 2014-09-15 MED ORDER — KCL IN DEXTROSE-NACL 20-5-0.45 MEQ/L-%-% IV SOLN
INTRAVENOUS | Status: DC
  Administered 2014-09-15 – 2014-09-19 (×8): via INTRAVENOUS
  Filled 2014-09-15 (×15): qty 1000

## 2014-09-15 MED ORDER — SERTRALINE HCL 25 MG PO TABS: 25.0000 mg | ORAL_TABLET | Freq: Every day | ORAL | Status: DC

## 2014-09-15 MED ORDER — SERTRALINE HCL 25 MG PO TABS
25.0000 mg | ORAL_TABLET | Freq: Every day | ORAL | Status: DC
  Administered 2014-09-15 – 2014-09-17 (×3): 25 mg via ORAL
  Filled 2014-09-15 (×3): qty 1

## 2014-09-15 MED ORDER — THIAMINE HCL 100 MG/ML IJ SOLN
300.0000 mg | Freq: Every day | INTRAMUSCULAR | Status: AC
  Administered 2014-09-15 – 2014-09-17 (×3): 300 mg via INTRAVENOUS
  Filled 2014-09-15: qty 4
  Filled 2014-09-15 (×2): qty 3

## 2014-09-15 NOTE — Progress Notes (Signed)
Pt. Pleasantly confused. No c/o pain. Pills whole with water. Incontinent of urine. Pt. Receiving IV antibiotics. Daughter at the bedside. Rested quietly throughout the night.

## 2014-09-15 NOTE — Progress Notes (Signed)
NEUROLOGY NOTE  S: still confused today but having fluctuations  ROS unobtainable due to confusion  O: AFVSS Except for HTN  Mild distress, overweight Normocephalic, oropharynx clear supple, no JVD CTA B, no wheezing RRR, no murmur  Alert and oriented to person only, moderate dysarthria, follows simple commands PERRLA, EOMI , face symmetric 5-/5 B, no tremor   A/: 1. Encephalopathy- still fluctuating without improvemement, concern for possible paraneoplastic syndrome vs. Autoimmune; Infection lower on differential now due to lack of response -  Add thiamine 300mg  IV daily, encourage PO intake - pending ana, thyroglobulin ab - Needs paraneoplastic panel but unsure where to find this - May benefit from round of steroids, plan to start solumedrol 1gm daily for 3 days on Monday - continue Rocephin 1gm q12h but stop on Monday -  Repeat EEG on Monday - Continue IVF - Will follow

## 2014-09-15 NOTE — Progress Notes (Addendum)
Patient ID: [name], female   DOB: 16-Feb-1930, 81 y.o.   MRN: [id number] [place] [facility] [place] NAME: [name]    MR#:  [id number]  DATE OF BIRTH:  Jun 26, 1929  SUBJECTIVE:  Daughter in the room. Pt is more alert but still pleasantly confused which is not her baseline per daughter . Decreased mouth intake per RN report  REVIEW OF SYSTEMS:    ROS Unobtainable because of the patient's altered mental status Tolerating Diet:  little Tolerating PT: not done yet  DRUG ALLERGIES:   Allergies  Allergen Reactions  . Betadine [Povidone Iodine] Hives  . Sulfa Antibiotics   . Valium [Diazepam] Anxiety    VITALS:  Blood pressure 139/83, pulse 101, temperature 97.7 F (36.5 C), temperature source Oral, resp. rate 20, height $RemoveBe'5\' 1"'QFWkumzni$  (1.549 m), weight 93.622 kg (206 lb 6.4 oz), SpO2 97 %.  PHYSICAL EXAMINATION:   Physical Exam  GENERAL:  81 y.o.-year-old patient lying in the bed with no acute distress.  EYES: Pupils equal, round, reactive to light and accommodation. No scleral icterus. Extraocular muscles intact.  HEENT: Head atraumatic, normocephalic. Oropharynx and nasopharynx clear.  NECK:  Supple, no jugular venous distention. No thyroid enlargement, no tenderness.  LUNGS: Normal breath sounds bilaterally, no wheezing, rales, rhonchi. No use of accessory muscles of respiration.  CARDIOVASCULAR: S1, S2 normal. No murmurs, rubs, or gallops.  ABDOMEN: Soft, nontender, nondistended. Bowel sounds present. No organomegaly or mass.  EXTREMITIES: No cyanosis, clubbing or edema b/l.    NEUROLOGIC: Awake, alert, oriented 2, following verbal commands, reflexes are 2+ PSYCHIATRIC:  patient is drowsy and trying to stay awake SKIN: No obvious rash, lesion, or ulcer.    LABORATORY PANEL:   CBC  Recent Labs Lab 09/14/14 0442  WBC 8.5  HGB 13.1  HCT 40.6  PLT 216    ------------------------------------------------------------------------------------------------------------------  Chemistries   Recent Labs Lab 09/11/14 2233 09/12/14 0647  NA 138  --   K 4.1  --   CL 97*  --   CO2 30  --   GLUCOSE 111*  --   BUN 19  --   CREATININE 0.92 0.89  CALCIUM 8.7*  --   AST 22  --   ALT 21  --   ALKPHOS 72  --   BILITOT 0.6  --    ------------------------------------------------------------------------------------------------------------------  Cardiac Enzymes  Recent Labs Lab 09/11/14 2233  TROPONINI 0.03   ------------------------------------------------------------------------------------------------------------------  RADIOLOGY:  Ct Chest Wo Contrast  09/14/2014   CLINICAL DATA:  81 year old female with abnormal left lung base. Confusion. Personal history of lymphoma. Initial encounter.  EXAM: CT CHEST WITHOUT CONTRAST  TECHNIQUE: Multidetector CT imaging of the chest was performed following the standard protocol without IV contrast.  COMPARISON:  Portable chest radiographs 09/11/2014 and earlier. CT Abdomen and Pelvis 05/17/2014. Chest CT 06/09/2013.  FINDINGS: Moderate bilateral layering pleural effusions are new since January. Superimposed bilateral compressive pulmonary atelectasis. Indistinctness of bilateral pulmonary vasculature. Atelectatic changes to the perihilar airways. Trachea and mainstem bronchi are patent.  No air bronchograms. 5-6 mm left lower lobe pulmonary nodule on series 3, image 24 is stable since February 2015. New nonspecific and indistinct 4-5 mm subpleural nodule nearby a associated with the left major fissure on image 17. This might be a reactive intrapleural node in this setting.  Cardiomegaly. No pericardial effusion. Calcified coronary atherosclerosis. No hilar or mediastinal lymphadenopathy. Negative non contrast thoracic inlet.  Advanced degenerative changes about the right  shoulder including dystrophic  calcifications. No axillary lymphadenopathy.  Negative visualized noncontrast liver, spleen, pancreas, adrenal glands, left kidney, and bowel in the upper abdomen. Surgically absent gallbladder.  Osteopenia and degenerative changes in the spine. Mild scoliosis. Advanced degenerative changes at the right shoulder. No acute osseous abnormality identified.  IMPRESSION: 1. Moderate layering pleural effusions with compressive atelectasis. Indistinct appearance of pulmonary vasculature, consider interstitial edema. 2. 5-6 mm left lower lobe lung nodule as described on 06/09/2013 is stable. There is a nearby a small 4-5 mm pleural based nodule which appears new but is probably either an intrapleural node or minimal trapped pleural fluid in this setting. 3. Chronic cardiomegaly without pericardial effusion. Calcified coronary atherosclerosis.   Electronically Signed   By: Genevie Ann M.D.   On: 09/14/2014 11:53     ASSESSMENT AND PLAN:   1. Acute encephalopathy. can be acute viral syndrome with dehydration, rule out UTI Per patient's daughter-in-law  she is noted to have intermittent episodes of decreased mentation/confusion for the past one and half weeks.  All labs are normal including urinalysis.  CT head and MRI  negative for acute IC pathology, no history of fever and patient does not look septic  -TSH,B12 are nml, ESR and CRP elevated -on empiric abx rocephin and azithromycin, clinically improving but not completely at her baseline CT chest is ordered-which has revealed moderate pleural effusion and pulmonary nodule but no pneumonia -? Related to chronic pain medications and trazodone -currently trazodone on hold, resuming oxycodone 5 mg every 12 hours as needed as per daughter's request -EEG -slow response, neurology is considering to repeat EEG -Neurology is  recommending steroid trial and paraneoplastic labs from Monday onwards if there is no clinical improvement -Started patient on Thiamin  300 mg IV  for 3 days -Restarted IV fluids as patient is with a poor by mouth intake -Check ANCA and thyroglobulin  2. Hypertension, stable on home medications.  3. History of hypothyroidism, on levothyroxine.  -TSH normal  4. Atrial fibrillation with controlled ventricular rate, likely chronic. No acute problems at present. Per patient's daughter, patient refused cardiac workup in the past.  5. Chronic backache/spinal stenosis, patient on chronic pain meds when necessary. We will hold off chronic pain medications for now, continue Tylenol.  6. History of lymphoma, no acute problems. onco consult is placed to Dr. Cynda Acres as patient was not seen by him for the past 8 years. Will defer paraneoplastic syndrome labs to oncology  7. Gen weakness- PT consult is placed  8. Possible depression-Dr. clapacs added Zoloft to her regimen for benefit of doubt   Case discussed with Care Management/Social Worker. Management plans discussed with the patient's daughter and she is in agreement.  CODE STATUS DNR  DVT Prophylaxis: lovenox  TOTAL TIME TAKING CARE OF THIS PATIENT: 30 minutes.    D/C  DEPENDING ON CLINICAL CONDITION.   Nicholes Mango M.D on 09/15/2014 at 3:48 PM  Between 7am to 6pm - Pager - [phone]  After 6pm go to [email or web address] - password [email or web address] [facility]  Office  [phone]  CC: Primary care physician; Lavera Guise, MD

## 2014-09-15 NOTE — Consult Note (Signed)
  Psychiatry: Follow-up note for this patient with delirium. Reviewed chart including recent neurology note. Reviewed vital signs. Spoke with the patient and her daughter. Patient says that she feels like she is doing worse than yesterday but can't describe in what way. She denies that she's had any hallucinations. Says she is still aware of having some memory problems. Her daughter thinks that the patient is about the same as yesterday.  Patient continues to complain of being tired and fatigued. Mood is a little bit down. No specific other complaints right now.  On mental status she looks down disheveled with a slow psychomotor activity.  Symptoms could be all due to delirium. I had thought that depression was less likely given what is described as an acute onset. Nevertheless the patient did make some vaguely morbid statements to me yesterday and she looks more run down and blunted today. I think it might be worthwhile to try adding a small dose of an antidepressant. I suggest 25 mg of Zoloft starting today. Will continue to follow

## 2014-09-16 LAB — CREATININE, SERUM
Creatinine, Ser: 1.04 mg/dL — ABNORMAL HIGH (ref 0.44–1.00)
GFR, EST AFRICAN AMERICAN: 56 mL/min — AB (ref >60)
GFR, EST NON AFRICAN AMERICAN: 48 mL/min — AB (ref >60)

## 2014-09-16 NOTE — Progress Notes (Signed)
Patient ID: KASHANA BREACH, female   DOB: 07/14/1929, 81 y.o.   MRN: [id number] [place] [facility] [place] NAME: Zayna Toste    MR#:  [id number]  DATE OF BIRTH:  1929/08/30  SUBJECTIVE:   Patient is more awake and alert today. Answering all questions appropriately. Feels better, tolerating diet.  REVIEW OF SYSTEMS:    Review of Systems  Constitutional: Negative for fever, chills and malaise/fatigue.  HENT: Negative for ear discharge and hearing loss.   Eyes: Negative for blurred vision and pain.  Respiratory: Negative for cough, sputum production and shortness of breath.   Cardiovascular: Negative for chest pain and palpitations.  Gastrointestinal: Negative for heartburn, nausea, vomiting and abdominal pain.  Genitourinary: Negative for dysuria and urgency.  Musculoskeletal: Negative for myalgias and neck pain.  Skin: Negative for itching and rash.  Neurological: Positive for weakness. Negative for dizziness, tingling, loss of consciousness and headaches.  Endo/Heme/Allergies: Negative for polydipsia. Does not bruise/bleed easily.  Psychiatric/Behavioral: The patient is not nervous/anxious.      Tolerating Diet:  yes Tolerating PT: not done yet  DRUG ALLERGIES:   Allergies  Allergen Reactions  . Betadine [Povidone Iodine] Hives  . Sulfa Antibiotics   . Valium [Diazepam] Anxiety    VITALS:  Blood pressure 146/87, pulse 97, temperature 97.9 F (36.6 C), temperature source Axillary, resp. rate 20, height _0  (1.549 m), weight 92.1 kg (203 lb 0.7 oz), SpO2 95 %.  PHYSICAL EXAMINATION:   Physical Exam  GENERAL:  81 y.o.-year-old patient lying in the bed with no acute distress.  EYES: Pupils equal, round, reactive to light and accommodation. No scleral icterus. Extraocular muscles intact.  HEENT: Head atraumatic, normocephalic. Oropharynx and nasopharynx clear.  NECK:  Supple, no jugular venous distention. No thyroid  enlargement, no tenderness.  LUNGS: Normal breath sounds bilaterally, no wheezing, rales, rhonchi. No use of accessory muscles of respiration.  CARDIOVASCULAR: S1, S2 normal. No murmurs, rubs, or gallops.  ABDOMEN: Soft, nontender, nondistended. Bowel sounds present. No organomegaly or mass.  EXTREMITIES: No cyanosis, clubbing or edema b/l.    NEUROLOGIC: Awake, alert, oriented 2, following verbal commands, reflexes are 2+ PSYCHIATRIC:  patient is drowsy and trying to stay awake SKIN: No obvious rash, lesion, or ulcer.    LABORATORY PANEL:   CBC  Recent Labs Lab 09/14/14 0442  WBC 8.5  HGB 13.1  HCT 40.6  PLT 216   ------------------------------------------------------------------------------------------------------------------  Chemistries   Recent Labs Lab 09/11/14 2233  09/16/14 0432  NA 138  --   --   K 4.1  --   --   CL 97*  --   --   CO2 30  --   --   GLUCOSE 111*  --   --   BUN 19  --   --   CREATININE 0.92  < > 1.04*  CALCIUM 8.7*  --   --   AST 22  --   --   ALT 21  --   --   ALKPHOS 72  --   --   BILITOT 0.6  --   --   < > = values in this interval not displayed. ------------------------------------------------------------------------------------------------------------------  Cardiac Enzymes  Recent Labs Lab 09/11/14 2233  TROPONINI 0.03   ------------------------------------------------------------------------------------------------------------------  RADIOLOGY:  No results found.   ASSESSMENT AND PLAN:   1. Acute encephalopathy. can be acute viral syndrome with dehydration, rule out UTI Clinical situation is improving with IV fluids, IV  Thiamine and empiric antibiotics All labs are normal including urinalysis.  CT head and MRI  negative for acute IC pathology, no history of fever and patient does not look septic  -TSH,B12 are nml, ESR and CRP elevated -on empiric abx rocephin and azithromycin, clinically improving but not completely at  her baseline CT chest is ordered-which has revealed moderate pleural effusion and pulmonary nodule but no pneumonia -? Related to chronic pain medications and trazodone -currently trazodone on hold, resuming oxycodone 5 mg every 12 hours as needed as per daughter's request -EEG -slow response, neurology is considering to repeat EEG -Neurology is  recommending steroid trial and paraneoplastic labs from Monday onwards if there is no clinical improvement -Started patient on Thiamin  300 mg IV for 3 days, yesterday 5/21 -on  IV fluids as patient is with a poor by mouth intake -Check ANCA and thyroglobulin  2. Hypertension, stable on home medications.  3. History of hypothyroidism, on levothyroxine.  -TSH normal  4. Atrial fibrillation with controlled ventricular rate, likely chronic. No acute problems at present. Per patient's daughter, patient refused cardiac workup in the past.  5. Chronic backache/spinal stenosis, patient on chronic pain meds when necessary. We will hold off chronic pain medications for now, continue Tylenol.  6. History of lymphoma, no acute problems. onco consult is placed to Dr. Cynda Acres as patient was not seen by him for the past 8 years. Will defer paraneoplastic syndrome labs to oncology  7. Gen weakness- PT consult is pending  8. Possible depression-Dr. clapacs added Zoloft to her regimen for benefit of doubt   Case discussed with Care Management/Social Worker. Management plans discussed with the patient's daughter and she is in agreement.  CODE STATUS DNR  DVT Prophylaxis: lovenox  TOTAL TIME TAKING CARE OF THIS PATIENT: 35 minutes.    D/C  DEPENDING ON CLINICAL CONDITION.   Nicholes Mango M.D on 09/16/2014 at 1:34 PM  Between 7am to 6pm - Pager - [phone]  After 6pm go to [email or web address] - password [email or web address] [facility]  Office  [phone]  CC: Primary care physician; Lavera Guise, MD

## 2014-09-16 NOTE — Consult Note (Signed)
  Psychiatry: Follow-up for this patient with delirium and dementia. Spoke with the patient and the daughter today. Patient continues to be confused. Daughter reports she thinks it's worse today than it was yesterday. Patient not making any sense this morning. The patient herself was not able to identify where she was right now. She did recognize her daughter and son-in-law although he she called him her daughter-in-law. Affect a little smiling but mostly flat. Seemed confused. Very sluggish and slow in her thinking.  First dose of Zoloft has been given. Possible this could be a side effect but I doubt it. Risperdal at low dose probably not causing this level of delirium or dementia either.  Continue monitoring. We'll follow-up. No change to treatment plan today.

## 2014-09-17 ENCOUNTER — Inpatient Hospital Stay: Payer: PPO

## 2014-09-17 DIAGNOSIS — Z8572 Personal history of non-Hodgkin lymphomas: Secondary | ICD-10-CM

## 2014-09-17 DIAGNOSIS — Z79899 Other long term (current) drug therapy: Secondary | ICD-10-CM

## 2014-09-17 DIAGNOSIS — R410 Disorientation, unspecified: Secondary | ICD-10-CM

## 2014-09-17 LAB — BASIC METABOLIC PANEL
ANION GAP: 5 (ref 5–15)
BUN: 10 mg/dL (ref 6–20)
CALCIUM: 9 mg/dL (ref 8.9–10.3)
CO2: 31 mmol/L (ref 22–32)
Chloride: 100 mmol/L — ABNORMAL LOW (ref 101–111)
Creatinine, Ser: 0.93 mg/dL (ref 0.44–1.00)
GFR calc non Af Amer: 55 mL/min — ABNORMAL LOW (ref >60)
GLUCOSE: 150 mg/dL — AB (ref 65–99)
Potassium: 4 mmol/L (ref 3.5–5.1)
Sodium: 136 mmol/L (ref 135–145)

## 2014-09-17 LAB — CBC
HEMATOCRIT: 41.1 % (ref 35.0–47.0)
HEMOGLOBIN: 13.9 g/dL (ref 12.0–16.0)
MCH: 30.4 pg (ref 26.0–34.0)
MCHC: 33.8 g/dL (ref 32.0–36.0)
MCV: 90 fL (ref 80.0–100.0)
PLATELETS: 229 10*3/uL (ref 150–440)
RBC: 4.57 MIL/uL (ref 3.80–5.20)
RDW: 15.1 % — AB (ref 11.5–14.5)
WBC: 9.1 10*3/uL (ref 3.6–11.0)

## 2014-09-17 LAB — THYROGLOBULIN ANTIBODY

## 2014-09-17 LAB — THYROID PEROXIDASE ANTIBODY: Thyroperoxidase Ab SerPl-aCnc: 12 IU/mL (ref 0–34)

## 2014-09-17 NOTE — Consult Note (Signed)
CC: Altered mental status  HPI: [name] is an 81 y.o. 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  No history of any fever, chills, shortness of breath, chest pain, focal weakness or numbness, nausea, vomiting, diarrhea, abdominal pain, dysuria. History of recent UTI and treated with Cipro which she completed recently.  Through out the hospital course pt's mentation has been waxing and wayning, able to follow commands a times but at same time sleeping a lot. MRI brain 5/18 no acute intracranial abnormality.     Past Medical History  Diagnosis Date  . Hypertension   . Thyroid disorder   . Lymphoma   . Hypothyroidism   . Arthritis   . Diverticulosis   . Obstructive sleep apnea   . Myasthenia gravis   . Hip fracture, left   . Fracture of right humerus     Past Surgical History  Procedure Laterality Date  . Nm pet lymphoma    . Hernia repair    . Cholecystectomy    . Back surgery      Family History  Problem Relation Age of Onset  . Heart Problems Mother   . Cancer Father     Social History:  reports that she has never smoked. She has never used smokeless tobacco. She reports that she does not drink alcohol or use illicit drugs.  Allergies  Allergen Reactions  . Betadine [Povidone Iodine] Hives  . Sulfa Antibiotics   . Valium [Diazepam] Anxiety    Medications: I have reviewed the patient's current medications.  ROS: Unable to obtain   Physical Examination: Blood pressure 151/77, pulse 87, temperature 97.2 F (36.2 C), temperature source Oral, resp. rate 18, height 5\' 1"  (1.549 m), weight 93.26 kg (205 lb 9.6 oz), SpO2 99 %.  Sedated, does not follow commands Opens eyes to painful stimuli and withdraws from painful stimuli.     Laboratory Studies:   Basic Metabolic Panel:  Recent Labs Lab 09/11/14 2233 09/12/14 0647 09/16/14 0432 09/17/14 0329  NA 138  --   --  136  K 4.1  --   --  4.0  CL 97*  --   --  100*  CO2 30  --   --  31  GLUCOSE 111*  --   --  150*  BUN 19  --   --  10  CREATININE 0.92 0.89 1.04* 0.93  CALCIUM 8.7*  --   --  9.0    Liver Function Tests:  Recent Labs Lab 09/11/14 2233  AST 22  ALT 21  ALKPHOS 72  BILITOT 0.6  PROT 6.6  ALBUMIN 2.9*   No results for input(s): LIPASE, AMYLASE in the last 168 hours.  Recent Labs Lab 09/12/14 1919  AMMONIA 16    CBC:  Recent Labs Lab 09/11/14 2233 09/12/14 0647 09/14/14 0442 09/17/14 0329  WBC 8.0 7.5 8.5 9.1  NEUTROABS 5.2  --   --   --   HGB 13.6 12.8 13.1 13.9  HCT 41.8 39.0 40.6 41.1  MCV 90.5 90.2 90.2 90.0  PLT 225 208 216 229    Cardiac Enzymes:  Recent Labs Lab 09/11/14 2233  TROPONINI 0.03    BNP: Invalid input(s): POCBNP  CBG: No results for input(s): GLUCAP in the last 168 hours.  Microbiology: Results for orders placed or performed during the hospital encounter of 09/11/14  Culture, blood (routine x 2)     Status: None (Preliminary result)   Collection Time: 09/13/14  7:50 PM  Result Value Ref Range Status   Specimen Description BLOOD  Final   Special Requests Normal  Final   Culture NO GROWTH 3 DAYS  Final   Report Status PENDING  Incomplete  Culture, blood (routine x 2)     Status: None (Preliminary result)   Collection Time: 09/13/14  7:50 PM  Result Value Ref Range Status   Specimen Description BLOOD  Final   Special Requests Normal  Final   Culture NO GROWTH 3 DAYS  Final   Report Status PENDING  Incomplete    Coagulation Studies: No results for input(s): LABPROT, INR in the last 72 hours.  Urinalysis:   Recent Labs Lab 09/11/14 2132  COLORURINE YELLOW*  LABSPEC 1.019  PHURINE 5.0  GLUCOSEU NEGATIVE  HGBUR NEGATIVE  BILIRUBINUR NEGATIVE  KETONESUR NEGATIVE   PROTEINUR 30*  NITRITE NEGATIVE  LEUKOCYTESUR NEGATIVE    Lipid Panel:  No results found for: CHOL, TRIG, HDL, CHOLHDL, VLDL, LDLCALC  HgbA1C: No results found for: HGBA1C  Urine Drug Screen:  No results found for: LABOPIA, COCAINSCRNUR, LABBENZ, AMPHETMU, THCU, LABBARB  Alcohol Level: No results for input(s): ETH in the last 168 hours.  Other results: EKG: A-fib.  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  No history of any fever, chills, shortness of breath, chest pain, focal weakness or numbness, nausea, vomiting, diarrhea, abdominal pain, dysuria. History of recent UTI and treated with Cipro which she completed recently.  Through out the hospital course pt's mentation has been waxing and wayning, able to follow commands a times but at same time sleeping a lot. MRI brain 5/18 no acute intracranial abnormality.      - last UA on 5/17 another one sent.  - please obtain chest X ray in AM to look for infiltrates. - I think symptoms are related most likely to delirium.  - Thyroid antibodies within normal limits.  - Agree with looking for possible reoccurrence of lymphoma with CT Abd/Chest/Pelvis.  Paraneoplastic work up sent - Would change Risperdal to Seroquel of 25 bID PRN and would try to give her more at night up to 50mg .  Pt was sleepy during the day, would turn tv on, keep blinds open and keep awake during the day.  Prior to doing so would like another EKG to look at QTC interval.   - First EEG showed no epileptiform activity, repeat pending. - s/p discussion with family at bedside.    09/17/2014, 6:55 PM

## 2014-09-17 NOTE — Consult Note (Signed)
  Psychiatry: Follow-up for this 81 year old woman with rapid onset dementia and delirium. Patient was sleeping when I came by to see her. Spoke with the daughter. I also spoke with Dr. Margaretmary Eddy earlier in the afternoon. Daughter reports that the patient hardly slept for 2 days. Has continued to be delirious. Very confused. "Talking out of her head".  Patient is not able to currently give any review of systems. She is asleep and seems to be twitching slightly in her sleep. No other behaviors identified.  Vital signs appear to be stable.  Concern was expressed about the possibility of Zoloft worsening agitation. I doubt that's the problem since everything and been going on already up until starting it and it was only 25 mg but it certainly fine to go ahead and discontinue it. Zoloft 25 mg discontinued as of this afternoon. No other change to medication. Continue to follow as needed.

## 2014-09-17 NOTE — Consult Note (Signed)
[place]  [phone] [phone]) [phone]  ID: Bufford Buttner OB: 01/30/1930  MR#: [id number]  PNT#:[id number]  Patient Care Team: Lavera Guise, MD as PCP - General (Internal Medicine)  CHIEF COMPLAINT:  Chief Complaint  Patient presents with  . Altered Mental Status    INTERVAL HISTORY: Patient is an 81 year old female emergency room with altered mental status and confusion for approximately 1-2 weeks. The entire history is given by her daughter. She does not report any fevers or recent illnesses. Patient was not complaining of pain. She has a fair appetite, but there is no report of weight loss. She denies any chest pain or shortness of breath. She has no nausea, vomiting, constipation, or diarrhea. Patient otherwise had been well. She has a distant history of lymphoma and was last evaluated by Dr. Inez Pilgrim in 2014.  REVIEW OF SYSTEMS:   Review of Systems  Unable to perform ROS: mental status change    As per HPI. Otherwise, a complete review of systems is negatve.  PAST MEDICAL HISTORY: Past Medical History  Diagnosis Date  . Hypertension   . Thyroid disorder   . Lymphoma   . Hypothyroidism   . Arthritis   . Diverticulosis   . Obstructive sleep apnea   . Myasthenia gravis   . Hip fracture, left   . Fracture of right humerus     PAST SURGICAL HISTORY: Past Surgical History  Procedure Laterality Date  . Nm pet lymphoma    . Hernia repair    . Cholecystectomy    . Back surgery      FAMILY HISTORY Family History  Problem Relation Age of Onset  . Heart Problems Mother   . Cancer Father        ADVANCED DIRECTIVES:    HEALTH MAINTENANCE: History  Substance Use Topics  . Smoking status: Never Smoker   . Smokeless tobacco: Never Used  . Alcohol Use: No     Colonoscopy:  PAP:  Bone density:  Lipid panel:  Allergies  Allergen Reactions  . Betadine [Povidone Iodine] Hives  . Sulfa Antibiotics   . Valium [Diazepam]  Anxiety    Current Facility-Administered Medications  Medication Dose Route Frequency Provider Last Rate Last Dose  . acetaminophen (TYLENOL) tablet 650 mg  650 mg Oral Q6H PRN Juluis Mire, MD   650 mg at 09/15/14 1444   Or  . acetaminophen (TYLENOL) suppository 650 mg  650 mg Rectal Q6H PRN Juluis Mire, MD      . aspirin EC tablet 81 mg  81 mg Oral Daily Juluis Mire, MD   81 mg at 09/17/14 1020  . atenolol (TENORMIN) tablet 25 mg  25 mg Oral Daily Juluis Mire, MD   25 mg at 09/17/14 1452  . azithromycin (ZITHROMAX) 250 mg in dextrose 5 % 125 mL IVPB  250 mg Intravenous Q24H Nicholes Mango, MD   250 mg at 09/17/14 1020  . benazepril (LOTENSIN) tablet 20 mg  20 mg Oral Daily Juluis Mire, MD   20 mg at 09/17/14 1020   And  . hydrochlorothiazide (MICROZIDE) capsule 12.5 mg  12.5 mg Oral Daily Juluis Mire, MD   12.5 mg at 09/17/14 1020  . cefTRIAXone (ROCEPHIN) 1 g in dextrose 5 % 50 mL IVPB - Premix  1 g Intravenous Q12H Valora Corporal, MD   1 g at 09/17/14 1511  . dextrose 5 % and 0.45 % NaCl with KCl 20 mEq/L infusion  Intravenous Continuous Nicholes Mango, MD 100 mL/hr at 09/17/14 1020    . docusate sodium (COLACE) capsule 100 mg  100 mg Oral BID Nicholes Mango, MD   100 mg at 09/17/14 1020  . enoxaparin (LOVENOX) injection 40 mg  40 mg Subcutaneous Q24H Juluis Mire, MD   40 mg at 09/16/14 2201  . levothyroxine (SYNTHROID, LEVOTHROID) tablet 75 mcg  75 mcg Oral QAC breakfast Juluis Mire, MD   75 mcg at 09/17/14 1020  . ondansetron (ZOFRAN) tablet 4 mg  4 mg Oral Q6H PRN Juluis Mire, MD       Or  . ondansetron [facility]) injection 4 mg  4 mg Intravenous Q6H PRN Juluis Mire, MD      . oxyCODONE (Oxy IR/ROXICODONE) immediate release tablet 5 mg  5 mg Oral BID PRN Nicholes Mango, MD      . polyethylene glycol (MIRALAX / GLYCOLAX) packet 17 g  17 g Oral Daily PRN Nicholes Mango, MD      . risperiDONE (RISPERDAL M-TABS) disintegrating tablet 0.5 mg  0.5 mg Oral QHS  PRN Gonzella Lex, MD   0.5 mg at 09/16/14 2201    OBJECTIVE: Filed Vitals:   09/17/14 1532  BP: 151/77  Pulse: 87  Temp: 97.2 F (36.2 C)  Resp: 18     Body mass index is 38.87 kg/(m^2).    ECOG FS:4 - Bedbound  General: Well-developed, well-nourished, no acute distress. Eyes: Pink conjunctiva, anicteric sclera. HEENT: Normocephalic, moist mucous membranes, clear oropharnyx. Lungs: Clear to auscultation bilaterally. Heart: Regular rate and rhythm. No rubs, murmurs, or gallops. Abdomen: Soft, nontender, nondistended. No organomegaly noted, normoactive bowel sounds. Musculoskeletal: No edema, cyanosis, or clubbing. Neuro: Confused. Cranial nerves grossly intact. Skin: No rashes or petechiae noted. Psych: Normal affect.    LAB RESULTS:  Lab Results  Component Value Date   NA 136 09/17/2014   K 4.0 09/17/2014   CL 100* 09/17/2014   CO2 31 09/17/2014   GLUCOSE 150* 09/17/2014   BUN 10 09/17/2014   CREATININE 0.93 09/17/2014   CALCIUM 9.0 09/17/2014   PROT 6.6 09/11/2014   ALBUMIN 2.9* 09/11/2014   AST 22 09/11/2014   ALT 21 09/11/2014   ALKPHOS 72 09/11/2014   BILITOT 0.6 09/11/2014   GFRNONAA 55* 09/17/2014   GFRAA >60 09/17/2014    Lab Results  Component Value Date   WBC 9.1 09/17/2014   NEUTROABS 5.2 09/11/2014   HGB 13.9 09/17/2014   HCT 41.1 09/17/2014   MCV 90.0 09/17/2014   PLT 229 09/17/2014     STUDIES: Ct Head Wo Contrast  09/11/2014   CLINICAL DATA:  Altered mental status. Decreased level of consciousness.  EXAM: CT HEAD WITHOUT CONTRAST  TECHNIQUE: Contiguous axial images were obtained from the base of the skull through the vertex without intravenous contrast.  COMPARISON:  07/06/2014  FINDINGS: No intracranial hemorrhage, mass effect, or midline shift. No hydrocephalus. The basilar cisterns are patent. No evidence of territorial infarct. No intracranial fluid collection. Central atrophy and chronic small vessel ischemic change, stable from prior  exam. Calvarium is intact. Included paranasal sinuses and mastoid air cells are well aerated.  IMPRESSION: 1.  No acute intracranial abnormality. 2. Unchanged atrophy and chronic small vessel ischemic change.   Electronically Signed   By: Jeb Levering M.D.   On: 09/11/2014 22:56   Ct Chest Wo Contrast  09/14/2014   CLINICAL DATA:  81 year old female with abnormal left lung base. Confusion. Personal history of  lymphoma. Initial encounter.  EXAM: CT CHEST WITHOUT CONTRAST  TECHNIQUE: Multidetector CT imaging of the chest was performed following the standard protocol without IV contrast.  COMPARISON:  Portable chest radiographs 09/11/2014 and earlier. CT Abdomen and Pelvis 05/17/2014. Chest CT 06/09/2013.  FINDINGS: Moderate bilateral layering pleural effusions are new since January. Superimposed bilateral compressive pulmonary atelectasis. Indistinctness of bilateral pulmonary vasculature. Atelectatic changes to the perihilar airways. Trachea and mainstem bronchi are patent.  No air bronchograms. 5-6 mm left lower lobe pulmonary nodule on series 3, image 24 is stable since February 2015. New nonspecific and indistinct 4-5 mm subpleural nodule nearby a associated with the left major fissure on image 17. This might be a reactive intrapleural node in this setting.  Cardiomegaly. No pericardial effusion. Calcified coronary atherosclerosis. No hilar or mediastinal lymphadenopathy. Negative non contrast thoracic inlet.  Advanced degenerative changes about the right shoulder including dystrophic calcifications. No axillary lymphadenopathy.  Negative visualized noncontrast liver, spleen, pancreas, adrenal glands, left kidney, and bowel in the upper abdomen. Surgically absent gallbladder.  Osteopenia and degenerative changes in the spine. Mild scoliosis. Advanced degenerative changes at the right shoulder. No acute osseous abnormality identified.  IMPRESSION: 1. Moderate layering pleural effusions with compressive  atelectasis. Indistinct appearance of pulmonary vasculature, consider interstitial edema. 2. 5-6 mm left lower lobe lung nodule as described on 06/09/2013 is stable. There is a nearby a small 4-5 mm pleural based nodule which appears new but is probably either an intrapleural node or minimal trapped pleural fluid in this setting. 3. Chronic cardiomegaly without pericardial effusion. Calcified coronary atherosclerosis.   Electronically Signed   By: Genevie Ann M.D.   On: 09/14/2014 11:53   Mr Jeri Cos RD Contrast  09/12/2014   CLINICAL DATA:  Altered mental status  EXAM: MRI HEAD WITHOUT AND WITH CONTRAST  TECHNIQUE: Multiplanar, multiecho pulse sequences of the brain and surrounding structures were obtained without and with intravenous contrast.  CONTRAST:  55mL MULTIHANCE GADOBENATE DIMEGLUMINE 529 MG/ML IV SOLN  COMPARISON:  CT head 09/11/2014.  MRI 11/29/2012  FINDINGS: Image quality degraded by motion. Fast scanning protocol was performed due to motion.  Moderate atrophy. Ventricular enlargement unchanged from prior CT and MRI scans. The ventricular enlargement most consistent with atrophy.  Negative for acute infarct.  Negative for hemorrhage or fluid collection  Negative for mass or edema.  Periventricular white matter hyperintensity is patchy and most consistent with chronic microvascular ischemia. Mild progression since 2014.  Normal enhancement following contrast infusion.  IMPRESSION: Image quality degraded by moderate motion  Generalized atrophy. Chronic microvascular ischemic change in the white matter.  Negative for acute infarct or mass.   Electronically Signed   By: Franchot Gallo M.D.   On: 09/12/2014 14:44   Dg Chest Port 1 View  09/11/2014   CLINICAL DATA:  Weakness, confusion, leg swelling, clinical concern for CHF.  EXAM: PORTABLE CHEST - 1 VIEW  COMPARISON:  07/09/2014  FINDINGS: Lower lung volumes from prior exam. Stable borderline mild cardiomegaly. No pulmonary edema. Ill-defined densities  the left lung base. No pneumothorax. Degenerative change in both shoulders. No acute osseous abnormalities are seen.  IMPRESSION: 1. Ill-defined densities at the left lung base, may reflect atelectasis or be related to soft tissue attenuation. Small pleural effusion or minimal pneumonia could have a similar appearance. 2. No radiographic findings of CHF.   Electronically Signed   By: Jeb Levering M.D.   On: 09/11/2014 23:05    ASSESSMENT: Altered mental status.  PLAN:  1. Altered mental status: Unclear etiology. CT and MRI of the head reported as above with no obvious etiology. Appreciate neurology input. Paraneoplastic syndrome is a possibility, but highly unlikely with no obvious evidence of lymphoma recurrence.  Possibly medication induced. 2. History lymphoma: CT chest as above with no obvious recurrence. Can consider CT of the abdomen and pelvis for completion. If her lymphoma has recurred, this is unlikely to cause of her altered mental status unless she has leptomeningeal disease which would require a lumbar puncture to further evaluate. Previously, when patient last saw Dr. Inez Pilgrim in 2014 she had stated that if she ever had a recurrence she would not accept any further chemotherapy.  Appreciate consult, will follow.    Lloyd Huger, MD   09/17/2014 7:00 PM

## 2014-09-17 NOTE — Progress Notes (Signed)
Patient went for EEG this morning. She continues to be confused and not making sense when she talks. VSS this shift. Patient slept between care.

## 2014-09-17 NOTE — Progress Notes (Signed)
PT Cancellation Note  Patient Details Name: [name] MRN: [id number] DOB: 06/29/1929   Cancelled Treatment:    Reason Eval/Treat Not Completed:  (per primary RW, patient with increase in confusion, hallucinations, delirium this date; unable to participate with skilled PT at this time.  Will re-attempt at later time/date as medically appropriate and patient able to actively participate.)  Nazanin Kinner H. Owens Shark, PT, DPT 09/17/2014, 3:23 PM [phone]

## 2014-09-17 NOTE — Progress Notes (Signed)
Patient ID: [name], female   DOB: 03-Apr-1930, 81 y.o.   MRN: [id number] [facility] NAME: [name]    MR#:  [id number]  DATE OF BIRTH:  [birth year]  SUBJECTIVE:   The patient is very confused today. Hallucinating which are new and talking out of her head. As per my examination yesterday she was having good conversation but according to daughter whole day  Yesterday she was confused  REVIEW OF SYSTEMS:    ROS  Unobtainable as the patient is confused  Tolerating Diet:  yes Tolerating PT: not done yet  DRUG ALLERGIES:   Allergies  Allergen Reactions  . Betadine [Povidone Iodine] Hives  . Sulfa Antibiotics   . Valium [Diazepam] Anxiety    VITALS:  Blood pressure 122/80, pulse 102, temperature 97.6 F (36.4 C), temperature source Oral, resp. rate 18, height $RemoveBe'5\' 1"'sgIIKrYLU$  (1.549 m), weight 93.26 kg (205 lb 9.6 oz), SpO2 96 %.  PHYSICAL EXAMINATION:   Physical Exam  GENERAL:  81 y.o.-year-old patient lying in the bed with no acute distress.  EYES: Pupils equal, round, reactive to light and accommodation. No scleral icterus. Extraocular muscles intact.  HEENT: Head atraumatic, normocephalic. Oropharynx and nasopharynx clear.  NECK:  Supple, no jugular venous distention. No thyroid enlargement, no tenderness.  LUNGS: Normal breath sounds bilaterally, no wheezing, rales, rhonchi. No use of accessory muscles of respiration.  CARDIOVASCULAR: S1, S2 normal. No murmurs, rubs, or gallops.  ABDOMEN: Soft, nontender, nondistended. Bowel sounds present. No organomegaly or mass.  EXTREMITIES: No cyanosis, clubbing or edema b/l.    NEUROLOGIC: Disoriented, hallucinating, reflexes are 2+ PSYCHIATRIC:  Disoriented, hallucinating SKIN: No obvious rash, lesion, or ulcer.    LABORATORY PANEL:   CBC  Recent Labs Lab 09/17/14 0329  WBC 9.1  HGB 13.9  HCT 41.1  PLT 229    ------------------------------------------------------------------------------------------------------------------  Chemistries   Recent Labs Lab 09/11/14 2233  09/17/14 0329  NA 138  --  136  K 4.1  --  4.0  CL 97*  --  100*  CO2 30  --  31  GLUCOSE 111*  --  150*  BUN 19  --  10  CREATININE 0.92  < > 0.93  CALCIUM 8.7*  --  9.0  AST 22  --   --   ALT 21  --   --   ALKPHOS 72  --   --   BILITOT 0.6  --   --   < > = values in this interval not displayed. ------------------------------------------------------------------------------------------------------------------  Cardiac Enzymes  Recent Labs Lab 09/11/14 2233  TROPONINI 0.03   ------------------------------------------------------------------------------------------------------------------  RADIOLOGY:  No results found.   ASSESSMENT AND PLAN:   1. Acute encephalopathy. Unclear etiology, can be acute viral syndrome with dehydration, rule out UTI Clinical situation is not improving with IV fluids, IV Thiamine and empiric antibiotics All labs are normal including urinalysis. Urine culture is still pending discussed with microbiology regarding the final results CT head and MRI  negative for acute IC pathology, no history of fever and patient does not look septic  -TSH,B12 are nml, ESR and CRP elevated -on empiric abx rocephin and azithromycin, clinically not improving  not  at her baseline CT chest is ordered-which has revealed moderate pleural effusion and pulmonary nodule but no pneumonia -? Related to chronic pain medications and trazodone -currently trazodone on hold, resuming oxycodone 5 mg every 12 hours as needed as per daughter's request -EEG -slow  response, neurology is considering to repeat EEG -Neurology is  recommending steroid trial and paraneoplastic labs from Monday onwards if there is no clinical improvement -Started patient on Thiamin  300 mg IV for 3 days, yesterday 5/21 -on  IV fluids as  patient is with a poor by mouth intake -Check ANCA and thyroglobulin-results are still pending -Infectious disease consult is placed to rule out sepsis, discussed with Dr. Ola Spurr  2. Hypertension, stable on home medications.  3. History of hypothyroidism, on levothyroxine.  -TSH normal  4. Atrial fibrillation with controlled ventricular rate, likely chronic. No acute problems at present. Per patient's daughter, patient refused cardiac workup in the past.  5. Chronic backache/spinal stenosis, patient on chronic pain meds when necessary. We will hold off chronic pain medications for now, continue Tylenol.  6. History of lymphoma, no acute problems. onco consult is placed to Dr. Cynda Acres as patient was not seen by him for the past 8 years. Will defer paraneoplastic syndrome labs to oncology, discussed with Dr. Grayland Ormond who will see the patient today as Dr. Cynda Acres is not working  7. Gen weakness- PT consult   8. Possible depression-Dr. clapacs added Zoloft but will discontinue that in view of hallucinations though generally it is not an adverse affect of Zoloft. Discussed with Dr. Weber Cooks   Case discussed with Care Management/Social Worker. Management plans discussed with the patient's daughter and she is in agreement.  CODE STATUS DNR  DVT Prophylaxis: lovenox  TOTAL TIME TAKING CARE OF THIS PATIENT: 35 minutes.    D/C  DEPENDING ON CLINICAL CONDITION.   Nicholes Mango M.D on 09/17/2014 at 1:27 PM  Between 7am to 6pm - Pager - [phone]  After 6pm go to [email or web address] - password [email or web address] [facility]  Office  [phone]  CC: Primary care physician; Lavera Guise, MD

## 2014-09-17 NOTE — Progress Notes (Signed)
Per MD patient is not medically stable for D/C today due to continued confusion. Per family this confusion is new. Patient is at baseline with mobility. CSW spoke with [place] Coordinator at [facility] and made her aware of above. CSW will continue to follow and assist as needed.   Blima Rich, [place] [phone]

## 2014-09-18 DIAGNOSIS — Z66 Do not resuscitate: Secondary | ICD-10-CM

## 2014-09-18 DIAGNOSIS — I10 Essential (primary) hypertension: Secondary | ICD-10-CM

## 2014-09-18 DIAGNOSIS — Z515 Encounter for palliative care: Secondary | ICD-10-CM

## 2014-09-18 DIAGNOSIS — E785 Hyperlipidemia, unspecified: Secondary | ICD-10-CM

## 2014-09-18 DIAGNOSIS — G7000 Myasthenia gravis without (acute) exacerbation: Secondary | ICD-10-CM

## 2014-09-18 DIAGNOSIS — C8510 Unspecified B-cell lymphoma, unspecified site: Secondary | ICD-10-CM

## 2014-09-18 DIAGNOSIS — E039 Hypothyroidism, unspecified: Secondary | ICD-10-CM

## 2014-09-18 DIAGNOSIS — K5790 Diverticulosis of intestine, part unspecified, without perforation or abscess without bleeding: Secondary | ICD-10-CM

## 2014-09-18 DIAGNOSIS — Z8781 Personal history of (healed) traumatic fracture: Secondary | ICD-10-CM

## 2014-09-18 DIAGNOSIS — M810 Age-related osteoporosis without current pathological fracture: Secondary | ICD-10-CM

## 2014-09-18 DIAGNOSIS — R4182 Altered mental status, unspecified: Secondary | ICD-10-CM

## 2014-09-18 DIAGNOSIS — G4733 Obstructive sleep apnea (adult) (pediatric): Secondary | ICD-10-CM

## 2014-09-18 LAB — CBC
HCT: 40.4 % (ref 35.0–47.0)
Hemoglobin: 13.3 g/dL (ref 12.0–16.0)
MCH: 29.8 pg (ref 26.0–34.0)
MCHC: 33 g/dL (ref 32.0–36.0)
MCV: 90.4 fL (ref 80.0–100.0)
PLATELETS: 236 10*3/uL (ref 150–440)
RBC: 4.47 MIL/uL (ref 3.80–5.20)
RDW: 15.3 % — ABNORMAL HIGH (ref 11.5–14.5)
WBC: 7.4 10*3/uL (ref 3.6–11.0)

## 2014-09-18 LAB — CULTURE, BLOOD (ROUTINE X 2)
CULTURE: NO GROWTH
Culture: NO GROWTH
Special Requests: NORMAL
Special Requests: NORMAL

## 2014-09-18 MED ORDER — ENSURE ENLIVE PO LIQD
237.0000 mL | Freq: Two times a day (BID) | ORAL | Status: DC
  Administered 2014-09-19 – 2014-09-20 (×2): 237 mL via ORAL

## 2014-09-18 MED ORDER — QUETIAPINE FUMARATE 25 MG PO TABS: 25.0000 mg | ORAL_TABLET | Freq: Every day | ORAL | Status: DC

## 2014-09-18 MED ORDER — PREDNISONE 50 MG PO TABS
50.0000 mg | ORAL_TABLET | Freq: Four times a day (QID) | ORAL | Status: AC
  Administered 2014-09-18 – 2014-09-19 (×3): 50 mg via ORAL
  Filled 2014-09-18 (×3): qty 1

## 2014-09-18 MED ORDER — BARIUM SULFATE 2.1 % PO SUSP
900.0000 mL | Freq: Two times a day (BID) | ORAL | Status: AC
Start: 2014-09-18 — End: 2014-09-18
  Administered 2014-09-18: 900 mL via ORAL

## 2014-09-18 MED ORDER — QUETIAPINE FUMARATE 25 MG PO TABS
25.0000 mg | ORAL_TABLET | Freq: Three times a day (TID) | ORAL | Status: DC
Start: 2014-09-18 — End: 2014-09-20
  Administered 2014-09-19 – 2014-09-20 (×4): 25 mg via ORAL
  Filled 2014-09-18 (×4): qty 1

## 2014-09-18 MED ORDER — QUETIAPINE FUMARATE 25 MG PO TABS
25.0000 mg | ORAL_TABLET | Freq: Two times a day (BID) | ORAL | Status: DC
  Administered 2014-09-18: 25 mg via ORAL
  Filled 2014-09-18: qty 1

## 2014-09-18 MED ORDER — DIPHENHYDRAMINE HCL 25 MG PO CAPS
50.0000 mg | ORAL_CAPSULE | Freq: Once | ORAL | Status: AC
  Administered 2014-09-19: 50 mg via ORAL
  Filled 2014-09-18: qty 2

## 2014-09-18 NOTE — Progress Notes (Signed)
Nutrition Follow-up  DOCUMENTATION CODES:     INTERVENTION:   (Meals and Snacks: Cater to patient preferences)  NUTRITION DIAGNOSIS:  Inadequate oral intake related to acute illness as evidenced by per patient/family report, energy intake < 75% for > 7 days, improving  GOAL:  Patient will meet greater than or equal to 90% of their needs - goal not met but improving   MONITOR:  PO intake, Labs (Energy Intake, Digestive Profile, Electrolyte and Renal profile)  REASON FOR ASSESSMENT:  Consult Poor PO  ASSESSMENT:  Clinical Update: Intake improving, pt confused Typical Food/ Fluid Intake: 90-100% of meals recorded x last 24 hrs Weight Changes: weight stable since 5/18 Labs: Electrolyte and Renal Profile:    Recent Labs Lab 09/11/14 2233 09/12/14 0647 09/16/14 0432 09/17/14 0329  BUN 19  --   --  10  CREATININE 0.92 0.89 1.04* 0.93  NA 138  --   --  136  K 4.1  --   --  4.0   Protein Profile:  Recent Labs Lab 09/11/14 2233  ALBUMIN 2.9*    Meds: Colace,  Physical Findings: pt confused  Height:  Ht Readings from Last 1 Encounters:  09/11/14 $RemoveB'5\' 1"'KZqFrfhS$  (1.549 m)    Weight:  Wt Readings from Last 1 Encounters:  09/18/14 207 lb 12.5 oz (94.25 kg)    Ideal Body Weight:     Wt Readings from Last 10 Encounters:  09/18/14 207 lb 12.5 oz (94.25 kg)  03/31/13 219 lb (99.338 kg)  12/07/12 220 lb 4 oz (99.905 kg)    BMI:  Body mass index is 39.28 kg/(m^2).  Skin:  Reviewed, no issues  Diet Order:  Diet Heart Room service appropriate?: Yes; Fluid consistency:: Thin  EDUCATION NEEDS:  No education needs identified at this time   Intake/Output Summary (Last 24 hours) at 09/18/14 1106 Last data filed at 09/18/14 0900  Gross per 24 hour  Intake 2017.33 ml  Output      0 ml  Net 2017.33 ml    Last BM:  5/20  Roda Shutters, RDN Pager: [phone] Office: [place]

## 2014-09-18 NOTE — Progress Notes (Signed)
Patient continues to be confused, but had more moments of alertness. No lovenox tonight. Lumbar puncture tomorrow. Pallative consult ordered. VSS this shift.

## 2014-09-18 NOTE — Consult Note (Signed)
CC: Altered mental status  HPI: [name] is an 81 y.o. 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  No history of any fever, chills, shortness of breath, chest pain, focal weakness or numbness, nausea, vomiting, diarrhea, abdominal pain, dysuria. History of recent UTI and treated with Cipro which she completed recently.  Through out the hospital course pt's mentation has been waxing and wayning, able to follow commands a times but at same time sleeping a lot. MRI brain 5/18 no acute intracranial abnormality.     Mentation is improved today. Confused on location but able to follow commands.  Repeat EEG shows generalized slowing.    Past Medical History  Diagnosis Date  . Hypertension   . Thyroid disorder   . Lymphoma   . Hypothyroidism   . Arthritis   . Diverticulosis   . Obstructive sleep apnea   . Myasthenia gravis   . Hip fracture, left   . Fracture of right humerus     Past Surgical History  Procedure Laterality Date  . Nm pet lymphoma    . Hernia repair    . Cholecystectomy    . Back surgery      Family History  Problem Relation Age of Onset  . Heart Problems Mother   . Cancer Father     Social History:  reports that she has never smoked. She has never used smokeless tobacco. She reports that she does not drink alcohol or use illicit drugs.  Allergies  Allergen Reactions  . Betadine [Povidone Iodine] Hives  . Sulfa Antibiotics   . Valium [Diazepam] Anxiety    Medications: I have reviewed the patient's current medications.  ROS: Unable to obtain   Physical Examination: Blood pressure 128/74, pulse 66, temperature 97.9 F (36.6 C), temperature source Oral, resp. rate 19, height 5\' 1"  (1.549 m), weight 94.25 kg (207 lb 12.5 oz), SpO2  94 %.  Sedated, does not follow commands Opens eyes to painful stimuli and withdraws from painful stimuli.    Laboratory Studies:   Basic Metabolic Panel:  Recent Labs Lab 09/11/14 2233 09/12/14 0647 09/16/14 0432 09/17/14 0329  NA 138  --   --  136  K 4.1  --   --  4.0  CL 97*  --   --  100*  CO2 30  --   --  31  GLUCOSE 111*  --   --  150*  BUN 19  --   --  10  CREATININE 0.92 0.89 1.04* 0.93  CALCIUM 8.7*  --   --  9.0    Liver Function Tests:  Recent Labs Lab 09/11/14 2233  AST 22  ALT 21  ALKPHOS 72  BILITOT 0.6  PROT 6.6  ALBUMIN 2.9*   No results for input(s): LIPASE, AMYLASE in the last 168 hours.  Recent Labs Lab 09/12/14 1919  AMMONIA 16    CBC:  Recent Labs Lab 09/11/14 2233 09/12/14 0647 09/14/14 0442 09/17/14 0329 09/18/14 0448  WBC 8.0 7.5 8.5 9.1 7.4  NEUTROABS 5.2  --   --   --   --   HGB 13.6 12.8 13.1 13.9 13.3  HCT 41.8 39.0 40.6 41.1 40.4  MCV 90.5 90.2 90.2 90.0 90.4  PLT 225 208 216 229 236  Cardiac Enzymes:  Recent Labs Lab 09/11/14 2233  TROPONINI 0.03    BNP: Invalid input(s): POCBNP  CBG: No results for input(s): GLUCAP in the last 168 hours.  Microbiology: Results for orders placed or performed during the hospital encounter of 09/11/14  Culture, blood (routine x 2)     Status: None   Collection Time: 09/13/14  7:50 PM  Result Value Ref Range Status   Specimen Description BLOOD  Final   Special Requests Normal  Final   Culture NO GROWTH 5 DAYS  Final   Report Status 09/18/2014 FINAL  Final  Culture, blood (routine x 2)     Status: None   Collection Time: 09/13/14  7:50 PM  Result Value Ref Range Status   Specimen Description BLOOD  Final   Special Requests Normal  Final   Culture NO GROWTH 5 DAYS  Final   Report Status 09/18/2014 FINAL  Final    Coagulation Studies: No results for input(s): LABPROT, INR in the last 72 hours.  Urinalysis:   Recent Labs Lab 09/11/14 2132  COLORURINE YELLOW*   LABSPEC 1.019  PHURINE 5.0  GLUCOSEU NEGATIVE  HGBUR NEGATIVE  BILIRUBINUR NEGATIVE  KETONESUR NEGATIVE  PROTEINUR 30*  NITRITE NEGATIVE  LEUKOCYTESUR NEGATIVE    Lipid Panel:  No results found for: CHOL, TRIG, HDL, CHOLHDL, VLDL, LDLCALC  HgbA1C: No results found for: HGBA1C  Urine Drug Screen:  No results found for: LABOPIA, COCAINSCRNUR, LABBENZ, AMPHETMU, THCU, LABBARB  Alcohol Level: No results for input(s): ETH in the last 168 hours.  Other results: EKG: A-fib.  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  No history of any fever, chills, shortness of breath, chest pain, focal weakness or numbness, nausea, vomiting, diarrhea, abdominal pain, dysuria. History of recent UTI and treated with Cipro which she completed recently.  Through out the hospital course pt's mentation has been waxing and wayning, able to follow commands a times but at same time sleeping a lot. MRI brain 5/18 no acute intracranial abnormality.     More awake today, follows minimal commands.     - last UA on 5/17 another one sent.  - I think symptoms are related most likely to delirium.  - Thyroid antibodies within normal limits.  - Agree with looking for possible reoccurrence of lymphoma with CT Abd/Chest/Pelvis.  Paraneoplastic work up sent - LP in AM to look for cytology for possible  ? Carcinomatous meningitis.  - Would change Risperdal to Seroquel of 25 bID PRN and would try to give her more at night up to 50mg .  Pt was sleepy during the day, would turn tv on, keep blinds open and keep awake during the day.  Prior to doing so would like another EKG to look at QTC interval.   - First EEG showed no epileptiform activity,  repeat one yesterday shows generalized slowing.   - s/p discussion with family at bedside.   - please call with questions.   Leotis Pain   09/18/2014, 5:05 PM

## 2014-09-18 NOTE — Progress Notes (Signed)
Clinical Education officer, museum (CSW) met with patient's daughter Sharee Pimple at bedside. Patient was laying in bed and appeared confused. Daughter Sharee Pimple reported that everything is up in the air now and they are waiting on test results. CSW and daughter explored placement options. Daughter is interested in going back to [facility] with hospice if it gets to that point. CSW explained rehab and long term care options. CSW gave daughter number to Vance Gather Medicaid worker at [facility]. Daughter reported that patient will not qualify for Medicaid and can pay privately for SNF if rehab is not an option. CSW made attending MD aware that daughter is interested in palliative. CSW will continue to follow and assist as needed.   Blima Rich, [place] [phone]

## 2014-09-18 NOTE — Progress Notes (Signed)
Patient ID: [name], female   DOB: 10/20/1929, 81 y.o.   MRN: [id number] [place] [facility] NAME: [name]    MR#:  [id number]  DATE OF BIRTH:  22-Sep-1929  SUBJECTIVE:   The patient is still confused with no improvemnent.     REVIEW OF SYSTEMS:    ROS  Unobtainable as the patient is confused  Tolerating Diet:  yes Tolerating PT: not done yet  DRUG ALLERGIES:   Allergies  Allergen Reactions  . Betadine [Povidone Iodine] Hives  . Sulfa Antibiotics   . Valium [Diazepam] Anxiety    VITALS:  Blood pressure 128/74, pulse 66, temperature 97.9 F (36.6 C), temperature source Oral, resp. rate 19, height $RemoveBe'5\' 1"'smBhAEVfP$  (1.549 m), weight 94.25 kg (207 lb 12.5 oz), SpO2 94 %.  PHYSICAL EXAMINATION:   Physical Exam  GENERAL:  81 y.o.-year-old patient lying in the bed with no acute distress.  EYES: Pupils equal, round, reactive to light and accommodation. No scleral icterus. Extraocular muscles intact.  HEENT: Head atraumatic, normocephalic. Oropharynx and nasopharynx clear.  NECK:  Supple, no jugular venous distention. No thyroid enlargement, no tenderness.  LUNGS: Normal breath sounds bilaterally, no wheezing, rales, rhonchi. No use of accessory muscles of respiration.  CARDIOVASCULAR: S1, S2 normal. No murmurs, rubs, or gallops.  ABDOMEN: Soft, nontender, nondistended. Bowel sounds present. No organomegaly or mass.  EXTREMITIES: No cyanosis, clubbing or edema b/l.    NEUROLOGIC: Disoriented, hallucinating, reflexes are 2+ PSYCHIATRIC:  Disoriented, hallucinating SKIN: No obvious rash, lesion, or ulcer.    LABORATORY PANEL:   CBC  Recent Labs Lab 09/18/14 0448  WBC 7.4  HGB 13.3  HCT 40.4  PLT 236   ------------------------------------------------------------------------------------------------------------------  Chemistries   Recent Labs Lab 09/11/14 2233  09/17/14 0329  NA 138  --  136  K 4.1  --  4.0   CL 97*  --  100*  CO2 30  --  31  GLUCOSE 111*  --  150*  BUN 19  --  10  CREATININE 0.92  < > 0.93  CALCIUM 8.7*  --  9.0  AST 22  --   --   ALT 21  --   --   ALKPHOS 72  --   --   BILITOT 0.6  --   --   < > = values in this interval not displayed. ------------------------------------------------------------------------------------------------------------------  Cardiac Enzymes  Recent Labs Lab 09/11/14 2233  TROPONINI 0.03   ------------------------------------------------------------------------------------------------------------------  RADIOLOGY:  No results found.   ASSESSMENT AND PLAN:   1. Acute encephalopathy. Unclear etiology, can be acute viral syndrome with dehydration, rule out UTI Clinical situation is not improving with IVF and empiric antibiotics All labs are normal including urinalysis. Urine cx not done by lab so far, reordered   LP - d/w IR for LP CT ABD N Pelvis ordered  Paraneoplastic panel sent Appreciate neuro/onco and ID input Palliative care consult placed as daughter is agreeable CT head and MRI  negative for acute IC pathology, no history of fever and patient does not look septic  -TSH,B12 are nml, ESR and CRP elevated -on empiric abx rocephin and azithromycin, clinically not improving  not  at her baseline CT chest is ordered-which has revealed moderate pleural effusion and pulmonary nodule but no pneumonia -? Related to chronic pain medications and trazodone -currently trazodone on hold, resuming oxycodone 5 mg every 12 hours as needed as per daughter's request -EEG -slow response, neurology  Repeated  EEG -patient received  Thiamin  300 mg IV for 3 days, completed yesterday 5/21 -on  IV fluids as patient is with a poor by mouth intake -Check ANCA and thyroglobulin-results are nml  2. Hypertension, stable on home medications.  3. History of hypothyroidism, on levothyroxine.  -TSH normal  4. Atrial fibrillation with controlled  ventricular rate, likely chronic. No acute problems at present. Per patient's daughter, patient refused cardiac workup in the past.  5. Chronic backache/spinal stenosis, patient on chronic pain meds when necessary. We will hold off chronic pain medications for now, continue Tylenol.  6. History of lymphoma, no acute problems. Seen by onco   7. Gen weakness- continue  PT  8. Possible depression-Dr. clapacs added Zoloft but will discontinue that in view of hallucinations though generally it is not an adverse affect of Zoloft. Discussed with Dr. Weber Cooks   Case discussed with Care Management/Social Worker. Management plans discussed with the patient's daughter and she is in agreement.  CODE STATUS DNR  DVT Prophylaxis: lovenox suspended for LP/SCDS  TOTAL TIME TAKING CARE OF THIS PATIENT: 35 minutes.    D/C  DEPENDING ON CLINICAL CONDITION.   Nicholes Mango M.D on 09/18/2014 at 7:45 PM  Between 7am to 6pm - Pager - [phone]  After 6pm go to [email or web address] - password [email or web address]  [facility]  Office  [phone] for now  CC: Primary care physician; Christy Guise, MD

## 2014-09-18 NOTE — Outcomes Assessment (Signed)
VSS, patient is alert but confused.  Family at bedside most of the night.  Incontinent of urine. IV antibiotics infusing.  Turned q2h.

## 2014-09-18 NOTE — Consult Note (Signed)
Reason for Consult: AMS    Referring Physician: Gouru  Principal Problem:   Acute delirium Active Problems:   Acute encephalopathy   HTN (hypertension)   Hypothyroidism   A-fib   Encephalopathy acute   HPI: [name] is a 82 y.o. female Htn Myasthenia gravis, prior lymphoma, hypothyroidism, OSA, with femur fracture approx 6 months ago s/p ORIF. She was brought in by family 5/18 with progressive confusion over several days to weeks to the point of being unable to be awoken.  Prior to her fall and fx she lived alone and had no obvious signs of dementia. Was followed by neuro for MG but stopped meds for this several months ago.  After fall was at rehab then to an assisted living facility where she was able to transfer to her chair, toilet and feed herself until this admission.  Since admission has had waxing and waning confusion with disorientation, hallucinations that it is snowing outside or that her brothers and sisters are nearby.   She has been seen by neuro and onc. No fevers , chills, ns. Has had several recent UTIS.  Past Medical History  Diagnosis Date  . Hypertension   . Thyroid disorder   . Lymphoma   . Hypothyroidism   . Arthritis   . Diverticulosis   . Obstructive sleep apnea   . Myasthenia gravis   . Hip fracture, left   . Fracture of right humerus    Past Surgical History  Procedure Laterality Date  . Nm pet lymphoma    . Hernia repair    . Cholecystectomy    . Back surgery      Allergies:  Allergies  Allergen Reactions  . Betadine [Povidone Iodine] Hives  . Sulfa Antibiotics   . Valium [Diazepam] Anxiety     Antibiotics Given (last 72 hours)    Date/Time Action Medication Dose Rate   09/15/14 2306 Given   cefTRIAXone (ROCEPHIN) 1 g in dextrose 5 % 50 mL IVPB - Premix 1 g 100 mL/hr   09/16/14 0837 Given   cefTRIAXone (ROCEPHIN) 1 g in dextrose 5 % 50 mL IVPB - Premix 1 g 100 mL/hr   09/16/14 1028 Given   azithromycin (ZITHROMAX) 250 mg in  dextrose 5 % 125 mL IVPB 250 mg 125 mL/hr   09/16/14 2202 Given   cefTRIAXone (ROCEPHIN) 1 g in dextrose 5 % 50 mL IVPB - Premix 1 g 100 mL/hr   09/17/14 1020 Given   azithromycin (ZITHROMAX) 250 mg in dextrose 5 % 125 mL IVPB 250 mg 125 mL/hr   09/17/14 1511 Given  [pharmacy delay]   cefTRIAXone (ROCEPHIN) 1 g in dextrose 5 % 50 mL IVPB - Premix 1 g 100 mL/hr   09/17/14 2343 Given  [med frozen, waiting to warm up room temp]   cefTRIAXone (ROCEPHIN) 1 g in dextrose 5 % 50 mL IVPB - Premix 1 g 100 mL/hr   09/18/14 1010 Given   cefTRIAXone (ROCEPHIN) 1 g in dextrose 5 % 50 mL IVPB - Premix 1 g 100 mL/hr   09/18/14 1126 Given   azithromycin (ZITHROMAX) 250 mg in dextrose 5 % 125 mL IVPB 250 mg 125 mL/hr     MEDICATIONS: . aspirin EC  81 mg Oral Daily  . atenolol  25 mg Oral Daily  . azithromycin  250 mg Intravenous Q24H  . benazepril  20 mg Oral Daily   And  . hydrochlorothiazide  12.5 mg Oral Daily  . cefTRIAXone (ROCEPHIN)  IV  1 g Intravenous Q12H  . docusate sodium  100 mg Oral BID  . feeding supplement (ENSURE ENLIVE)  237 mL Oral BID BM  . levothyroxine  75 mcg Oral QAC breakfast    History  Substance Use Topics  . Smoking status: Never Smoker   . Smokeless tobacco: Never Used  . Alcohol Use: No    Family History  Problem Relation Age of Onset  . Heart Problems Mother   . Cancer Father     Review of Systems - 11 systems reviewed and negative per HPI   OBJECTIVE: Temp:  [97.2 F (36.2 C)-98.6 F (37 C)] 98.3 F (36.8 C) (05/24 0829) Pulse Rate:  [75-87] 87 (05/24 0829) Resp:  [18] 18 (05/24 0829) BP: (145-154)/(69-85) 154/69 mmHg (05/24 0829) SpO2:  [96 %-99 %] 96 % (05/24 0829) Weight:  [94.25 kg (207 lb 12.5 oz)] 94.25 kg (207 lb 12.5 oz) (05/24 0300) Physical Exam  Constitutional:  Awake, pleasant interactive but cannot name where she is or tell her story.  appears well-developed and well-nourished. No distress. obese HENT: [place]/AT, PERRLA, no scleral  icterus Mouth/Throat: Oropharynx is clear and moist. No oropharyngeal exudate.  Cardiovascular: Normal rate, regular rhythm and normal heart sounds. Exam reveals no gallop and no friction rub.  No murmur heard.  Pulmonary/Chest: Effort normal and breath sounds normal. No respiratory distress.  has no wheezes.  Neck = supple, no nuchal rigidity Abdominal: Soft. Bowel sounds are normal.  exhibits no distension. There is no tenderness.  Lymphadenopathy: no cervical adenopathy. No axillary adenopathy Neurological: alert and interactive. But when asked where is she can only pick schoolhouse out of a list of places, thinks it is the late '70s.  Can name self and knows her dob and her daughters name Skin: Skin is warm and dry. No rash noted. No erythema.  Psychiatric: as above   behavior is somewhat suspicious LABS: Results for orders placed or performed during the hospital encounter of 09/11/14 (from the past 48 hour(s))  CBC     Status: Abnormal   Collection Time: 09/17/14  3:29 AM  Result Value Ref Range   WBC 9.1 3.6 - 11.0 K/uL   RBC 4.57 3.80 - 5.20 MIL/uL   Hemoglobin 13.9 12.0 - 16.0 g/dL   HCT 41.1 35.0 - 47.0 %   MCV 90.0 80.0 - 100.0 fL   MCH 30.4 26.0 - 34.0 pg   MCHC 33.8 32.0 - 36.0 g/dL   RDW 15.1 (H) 11.5 - 14.5 %   Platelets 229 150 - 440 K/uL  Basic metabolic panel     Status: Abnormal   Collection Time: 09/17/14  3:29 AM  Result Value Ref Range   Sodium 136 135 - 145 mmol/L   Potassium 4.0 3.5 - 5.1 mmol/L   Chloride 100 (L) 101 - 111 mmol/L   CO2 31 22 - 32 mmol/L   Glucose, Bld 150 (H) 65 - 99 mg/dL   BUN 10 6 - 20 mg/dL   Creatinine, Ser 0.93 0.44 - 1.00 mg/dL   Calcium 9.0 8.9 - 10.3 mg/dL   GFR calc non Af Amer 55 (L) >60 mL/min   GFR calc Af Amer >60 >60 mL/min    Comment: (NOTE) The eGFR has been calculated using the CKD EPI equation. This calculation has not been validated in all clinical situations. eGFR's persistently <60 mL/min signify possible Chronic  Kidney Disease.    Anion gap 5 5 - 15  CBC     Status: Abnormal  Collection Time: 09/18/14  4:48 AM  Result Value Ref Range   WBC 7.4 3.6 - 11.0 K/uL   RBC 4.47 3.80 - 5.20 MIL/uL   Hemoglobin 13.3 12.0 - 16.0 g/dL   HCT 40.4 35.0 - 47.0 %   MCV 90.4 80.0 - 100.0 fL   MCH 29.8 26.0 - 34.0 pg   MCHC 33.0 32.0 - 36.0 g/dL   RDW 15.3 (H) 11.5 - 14.5 %   Platelets 236 150 - 440 K/uL   No components found for: ESR, C REACTIVE PROTEIN MICRO: Results for orders placed or performed during the hospital encounter of 09/11/14  Culture, blood (routine x 2)     Status: None   Collection Time: 09/13/14  7:50 PM  Result Value Ref Range Status   Specimen Description BLOOD  Final   Special Requests Normal  Final   Culture NO GROWTH 5 DAYS  Final   Report Status 09/18/2014 FINAL  Final  Culture, blood (routine x 2)     Status: None   Collection Time: 09/13/14  7:50 PM  Result Value Ref Range Status   Specimen Description BLOOD  Final   Special Requests Normal  Final   Culture NO GROWTH 5 DAYS  Final   Report Status 09/18/2014 FINAL  Final    IMAGING:  Ct Head Wo Contrast  09/11/2014   CLINICAL DATA:  Altered mental status. Decreased level of consciousness.  EXAM: CT HEAD WITHOUT CONTRAST  TECHNIQUE: Contiguous axial images were obtained from the base of the skull through the vertex without intravenous contrast.  COMPARISON:  07/06/2014  FINDINGS: No intracranial hemorrhage, mass effect, or midline shift. No hydrocephalus. The basilar cisterns are patent. No evidence of territorial infarct. No intracranial fluid collection. Central atrophy and chronic small vessel ischemic change, stable from prior exam. Calvarium is intact. Included paranasal sinuses and mastoid air cells are well aerated.  IMPRESSION: 1.  No acute intracranial abnormality. 2. Unchanged atrophy and chronic small vessel ischemic change.   Electronically Signed   By: Jeb Levering M.D.   On: 09/11/2014 22:56   Mr Jeri Cos YS  Contrast  09/12/2014   CLINICAL DATA:  Altered mental status  EXAM: MRI HEAD WITHOUT AND WITH CONTRAST  TECHNIQUE: Multiplanar, multiecho pulse sequences of the brain and surrounding structures were obtained without and with intravenous contrast.  CONTRAST:  57mL MULTIHANCE GADOBENATE DIMEGLUMINE 529 MG/ML IV SOLN  COMPARISON:  CT head 09/11/2014.  MRI 11/29/2012  FINDINGS: Image quality degraded by motion. Fast scanning protocol was performed due to motion.  Moderate atrophy. Ventricular enlargement unchanged from prior CT and MRI scans. The ventricular enlargement most consistent with atrophy.  Negative for acute infarct.  Negative for hemorrhage or fluid collection  Negative for mass or edema.  Periventricular white matter hyperintensity is patchy and most consistent with chronic microvascular ischemia. Mild progression since 2014.  Normal enhancement following contrast infusion.  IMPRESSION: Image quality degraded by moderate motion  Generalized atrophy. Chronic microvascular ischemic change in the white matter.  Negative for acute infarct or mass.   Electronically Signed   By: Franchot Gallo M.D.   On: 09/12/2014 14:44   Dg Chest Port 1 View  09/11/2014   CLINICAL DATA:  Weakness, confusion, leg swelling, clinical concern for CHF.  EXAM: PORTABLE CHEST - 1 VIEW  COMPARISON:  07/09/2014  FINDINGS: Lower lung volumes from prior exam. Stable borderline mild cardiomegaly. No pulmonary edema. Ill-defined densities the left lung base. No pneumothorax. Degenerative change in both  shoulders. No acute osseous abnormalities are seen.  IMPRESSION: 1. Ill-defined densities at the left lung base, may reflect atelectasis or be related to soft tissue attenuation. Small pleural effusion or minimal pneumonia could have a similar appearance. 2. No radiographic findings of CHF.   Electronically Signed   By: Jeb Levering M.D.   On: 09/11/2014 23:05  HISTORICAL MICRO/IMAGING  Assessment/Plan:  MAEOLA MCHANEY is a 81  y.o. female Htn Myasthenia gravis, prior lymphoma, hypothyroidism, OSA, with femur fracture approx 6 months ago s/p ORIF. She was brought in by family 5/18 with progressive confusion over several days to weeks to the point of being unable to be awoken.  Prior to her fall and fx she lived alone and had no obvious signs of dementia. Was followed by neuro for MG but stopped meds for this several months ago.  After fall was at rehab then to an assisted living facility where she was able to transfer to her chair, toilet and feed herself until this admission.  Since admission bcx, ucx neg, ct chest neg for pna, MRI brain neg for acute findings. She has no evidence of active bacterial  infection at this point with no fever or leukocytosis.  She may have a viral encephalopathy but I think this would be unlikely.    Rec Check LP (daughter says one is scheduled for tomorrow) Would check routine CSF studies as well as HSV PCR, arboviral panel I have ordered RPR as well. THank you for consult. I have discussed the case with the patient, her Daughter and Dr Margaretmary Eddy.Will follow with you.

## 2014-09-18 NOTE — Care Management Note (Signed)
Case Management Note  Patient Details  Name: [name] MRN: [id number] Date of Birth: 1930/04/04  Subjective/Objective:    Case discussed with attending. Further workup for paraneoplastic syndrome. Chest xray, CT abd/pelvis for possible etiology. Oncology and neurology consults.   Daughter concerned about confusion and etiology. RNCM following.               Action/Plan:   Expected Discharge Date:                  Expected Discharge Plan:  Assisted Living / Rest Home  In-House Referral:  Clinical Social Work  Discharge planning Services  CM Consult  Post Acute Care Choice:    Choice offered to:     DME Arranged:    DME Agency:     HH Arranged:    HH Agency:     Status of Service:  In process, will continue to follow  Medicare Important Message Given:    Date Medicare IM Given:    Medicare IM give by:    Date Additional Medicare IM Given:    Additional Medicare Important Message give by:     If discussed at [place] of Stay Meetings, dates discussed:    Additional Comments:  Jolly Mango, RN 09/18/2014, 10:09 AM

## 2014-09-18 NOTE — Consult Note (Signed)
  Psychiatry: Follow-up for this 81 year old woman with delirium. I found her today to be awake and alert and more communicative than yesterday. Patient was able to engage in some degree of conversation. On review of systems she denies having any specific pain. Says she is still feeling tired. Slept better last night. Denies suicidal thoughts denies feeling depressed. Admits however to having some paranoid ideation.  Conversation with the daughter reveals that all day today the patient has been endorsing paranoid ideas of various sorts and seems to be developing more delusions. On the other hand she does not appear to be delirious and is not agitated.  Current medication is Seroquel 25 mg twice a day. I would like to gently increase the amount of anti-psychotic without risking making her more fatigued door delirious. I will increase the dose to 25 mg 3 times a day. Continue to follow. Educational and supportive counseling done with the daughter and patient.

## 2014-09-18 NOTE — Consult Note (Addendum)
Palliative Medicine Inpatient Consult Note   Name: [name] Date: 09/18/2014 MRN: [id number]  DOB: [birth year]  Referring Physician: Nicholes Mango, MD  Palliative Care consult requested for this 81 y.o. female for goals of medical therapy in patient with h/o lymphoma, myasthenia gravis, OP, spinal stenosis, admitted with altered mental status  [name] is an 81 yo woman with PMH of HTN, hyperlipidemia, hypothyroidism, B-cell lymphoma, myasthenia gravis, osteoporosis with h/o fragility fx (L.hip, R.arm), OA with spinal stenosis, OSA, diverticulosis, s/p cholecystectomy and hernia repair. She was admitted 09/11/14 with 1-2 weeks of intermittent altered mental status. Pt has had extensive work-up including MRI, EEG & chest CT without etiology of delirium. At present, pt is lying in bed. Very pleasant, says she is doing well but on questioning clearly confused. [name].    REVIEW OF SYSTEMS:  Pain: None Dyspnea:  No Nausea/Vomiting:  No Diarrhea:  No Constipation:   No Depression:   No Anxiety:   No Fatigue:   No   SOCIAL HISTORY: Pt is widowed. She lived at home until 02/2014 when she had a hip fx. She has not returned home since that time. She is currently at [facility]. She has 2 sons and 2 daughters. She did not work outside the home.   reports that she has never smoked. She has never used smokeless tobacco. She reports that she does not drink alcohol or use illicit drugs.  LEGAL DOCUMENTS:  Health Care Power of Attorney:  Yes., daughters [name] and [name] are shared HCPOA's.  CODE STATUS: DNR  PAST MEDICAL HISTORY: Past Medical History  Diagnosis Date  . Hypertension   . Thyroid disorder   . Lymphoma   . Hypothyroidism   . Arthritis   . Diverticulosis   . Obstructive sleep apnea   . Myasthenia gravis   . Hip fracture, left   . Fracture of right humerus     PAST SURGICAL HISTORY:  Past Surgical History  Procedure Laterality Date  . Nm pet lymphoma     . Hernia repair    . Cholecystectomy    . Back surgery      ALLERGIES:  is allergic to betadine; sulfa antibiotics; and valium.  MEDICATIONS:  Current Facility-Administered Medications  Medication Dose Route Frequency Provider Last Rate Last Dose  . acetaminophen (TYLENOL) tablet 650 mg  650 mg Oral Q6H PRN Christy Mire, MD   650 mg at 09/15/14 1444   Or  . acetaminophen (TYLENOL) suppository 650 mg  650 mg Rectal Q6H PRN Christy Mire, MD      . aspirin EC tablet 81 mg  81 mg Oral Daily Christy Mire, MD   81 mg at 09/18/14 1011  . atenolol (TENORMIN) tablet 25 mg  25 mg Oral Daily Christy Mire, MD   25 mg at 09/18/14 1010  . azithromycin (ZITHROMAX) 250 mg in dextrose 5 % 125 mL IVPB  250 mg Intravenous Q24H Christy Mango, MD   250 mg at 09/18/14 1126  . benazepril (LOTENSIN) tablet 20 mg  20 mg Oral Daily Christy Mire, MD   20 mg at 09/18/14 1011   And  . hydrochlorothiazide (MICROZIDE) capsule 12.5 mg  12.5 mg Oral Daily Christy Mire, MD   12.5 mg at 09/18/14 1010  . cefTRIAXone (ROCEPHIN) 1 g in dextrose 5 % 50 mL IVPB - Premix  1 g Intravenous Q12H Christy Corporal, MD   1 g at 09/18/14 1010  .  dextrose 5 % and 0.45 % NaCl with KCl 20 mEq/L infusion   Intravenous Continuous Christy Mango, MD 100 mL/hr at 09/17/14 2107    . docusate sodium (COLACE) capsule 100 mg  100 mg Oral BID Christy Mango, MD   100 mg at 09/18/14 1011  . feeding supplement (ENSURE ENLIVE) (ENSURE ENLIVE) liquid 237 mL  237 mL Oral BID BM Christy Gouru, MD      . levothyroxine (SYNTHROID, LEVOTHROID) tablet 75 mcg  75 mcg Oral QAC breakfast Christy Mire, MD   75 mcg at 09/18/14 0751  . ondansetron (ZOFRAN) tablet 4 mg  4 mg Oral Q6H PRN Christy Mire, MD       Or  . ondansetron [facility]) injection 4 mg  4 mg Intravenous Q6H PRN Christy Mire, MD      . oxyCODONE (Oxy IR/ROXICODONE) immediate release tablet 5 mg  5 mg Oral BID PRN Christy Mango, MD      . polyethylene glycol (MIRALAX /  GLYCOLAX) packet 17 g  17 g Oral Daily PRN Christy Mango, MD      . risperiDONE (RISPERDAL M-TABS) disintegrating tablet 0.5 mg  0.5 mg Oral QHS PRN Christy Lex, MD   0.5 mg at 09/16/14 2201    Vital Signs: BP 128/74 mmHg  Pulse 66  Temp(Src) 97.9 F (36.6 C) (Oral)  Resp 19  Ht 5\' 1"  (1.549 m)  Wt 94.25 kg (207 lb 12.5 oz)  BMI 39.28 kg/m2  SpO2 94% Filed Weights   09/16/14 0604 09/17/14 0540 09/18/14 0300  Weight: 92.1 kg (203 lb 0.7 oz) 93.26 kg (205 lb 9.6 oz) 94.25 kg (207 lb 12.5 oz)    Estimated body mass index is 39.28 kg/(m^2) as calculated from the following:   Height as of this encounter: 5\' 1"  (1.549 m).   Weight as of this encounter: 94.25 kg (207 lb 12.5 oz).  PERFORMANCE STATUS (ECOG) : 3 - Symptomatic, >50% confined to bed  PHYSICAL EXAM:  General: NAD HEENT: OP clear, moist oral mucosa Neck: Trachea midline  Cardiovascular: irregular rate and rhythm Pulmonary/Chest: Clear ant fields Abdominal: Soft, NTTP,  + bowel sounds GU: No SP tenderness Extremities: + edema  Neurological: moves L.UE, unable to lift R.UE, 3+ motor LE's Skin: no rashed Psychiatric: alert, oriented to person, place, not year   LABS: CBC:  Recent Labs Lab 09/17/14 0329 09/18/14 0448  WBC 9.1 7.4  HGB 13.9 13.3  HCT 41.1 40.4  PLT 229 236   Comprehensive Metabolic Panel:  Recent Labs Lab 09/11/14 2233  09/16/14 0432 09/17/14 0329  NA 138  --   --  136  K 4.1  --   --  4.0  CL 97*  --   --  100*  CO2 30  --   --  31  GLUCOSE 111*  --   --  150*  BUN 19  --   --  10  CREATININE 0.92  < > 1.04* 0.93  CALCIUM 8.7*  --   --  9.0  AST 22  --   --   --   ALT 21  --   --   --   ALKPHOS 72  --   --   --   BILITOT 0.6  --   --   --   < > = values in this interval not displayed.  IMPRESSION:  [name] is an 81 yo woman with PMH of HTN, hyperlipidemia, hypothyroidism, B-cell lymphoma, myasthenia gravis,  osteoporosis with h/o fragility fx (L.hip, R.arm), OA with spinal  stenosis, OSA, diverticulosis, s/p cholecystectomy and hernia repair. She was admitted 09/11/14 with 1-2 weeks of intermittent altered mental status. Pt has had extensive work-up including MRI, EEG & chest CT without etiology of delirium. Plan is for LP tomorrow.   I spoke with pt and then with daughter, [name], outside of pt's room. Daughter is obviously concerned that pt's delirium persists. She is anxious to see if LP gives new information.   Daughter says that pt has demonstrated some "paranoid" thinking, eg, thinks daughter is plotting with MD's against her. Daughter recalls this sort of paranoid thinking when pt 1st went to SNF after hip fx 2015 and this has recurred over the past months.   Will follow up tomorrow.    PLAN: 1. LP 2. Check cryptococcal Ag if not already done   More than 50% of the visit was spent in counseling/coordination of care: YES  Time spent: 70 minutes

## 2014-09-19 ENCOUNTER — Other Ambulatory Visit: Payer: Self-pay | Admitting: Diagnostic Radiology

## 2014-09-19 ENCOUNTER — Inpatient Hospital Stay: Payer: PPO

## 2014-09-19 ENCOUNTER — Encounter: Payer: Self-pay | Admitting: Radiology

## 2014-09-19 DIAGNOSIS — G9340 Encephalopathy, unspecified: Principal | ICD-10-CM

## 2014-09-19 LAB — URINALYSIS COMPLETE WITH MICROSCOPIC (ARMC ONLY)
BILIRUBIN URINE: NEGATIVE
Bacteria, UA: NONE SEEN
Glucose, UA: NEGATIVE mg/dL
Hgb urine dipstick: NEGATIVE
KETONES UR: NEGATIVE mg/dL
Leukocytes, UA: NEGATIVE
Nitrite: NEGATIVE
PROTEIN: NEGATIVE mg/dL
RBC / HPF: NONE SEEN RBC/hpf (ref 0–5)
Specific Gravity, Urine: 1.002 — ABNORMAL LOW (ref 1.005–1.030)
Squamous Epithelial / LPF: NONE SEEN
WBC UA: NONE SEEN WBC/hpf (ref 0–5)
pH: 7 (ref 5.0–8.0)

## 2014-09-19 LAB — APTT: aPTT: 31 seconds (ref 24–36)

## 2014-09-19 LAB — PROTIME-INR
INR: 1
Prothrombin Time: 13.4 seconds (ref 11.4–15.0)

## 2014-09-19 LAB — PROTEIN, CSF: Total  Protein, CSF: 39 mg/dL (ref 15–45)

## 2014-09-19 LAB — RPR: RPR Ser Ql: NONREACTIVE

## 2014-09-19 LAB — CSF CELL COUNT WITH DIFFERENTIAL
RBC Count, CSF: 29 /mm3 — ABNORMAL HIGH (ref 0–3)
TUBE #: 3
WBC, CSF: 0 /mm3

## 2014-09-19 LAB — HIV ANTIBODY (ROUTINE TESTING W REFLEX): HIV SCREEN 4TH GENERATION: NONREACTIVE

## 2014-09-19 LAB — GLUCOSE, CSF: Glucose, CSF: 87 mg/dL — ABNORMAL HIGH (ref 40–70)

## 2014-09-19 MED ORDER — AZITHROMYCIN 250 MG PO TABS
250.0000 mg | ORAL_TABLET | Freq: Every day | ORAL | Status: DC
  Administered 2014-09-19: 250 mg via ORAL
  Filled 2014-09-19: qty 1

## 2014-09-19 MED ORDER — IOHEXOL 300 MG/ML  SOLN
100.0000 mL | Freq: Once | INTRAMUSCULAR | Status: AC | PRN
  Administered 2014-09-19: 100 mL via INTRAVENOUS

## 2014-09-19 NOTE — Progress Notes (Signed)
Pt alert but still confused. Pt went for abdominal CT scan and spinal tap this am. Will continue to monitor.

## 2014-09-19 NOTE — Progress Notes (Signed)
Patient ID: [name], female   DOB: 16-Dec-1929, 81 y.o.   MRN: [id number] [place] [facility] [place] NAME: [name]    MR#:  [id number]  DATE OF BIRTH:  09-May-1929  SUBJECTIVE:   The patient is still confused with no improvemnent.   Working with PT  REVIEW OF SYSTEMS:    ROS  Unobtainable as the patient is confused  Tolerating Diet:  yes Tolerating PT: not done yet  DRUG ALLERGIES:   Allergies  Allergen Reactions  . Betadine [Povidone Iodine] Hives  . Sulfa Antibiotics   . Valium [Diazepam] Anxiety    VITALS:  Blood pressure 143/82, pulse 86, temperature 97.4 F (36.3 C), temperature source Oral, resp. rate 17, height $RemoveBe'5\' 1"'fxSctzaaV$  (1.549 m), weight 91.627 kg (202 lb), SpO2 95 %.  PHYSICAL EXAMINATION:   Physical Exam  GENERAL:  81 y.o.-year-old patient lying in the bed with no acute distress.  EYES: Pupils equal, round, reactive to light and accommodation. No scleral icterus. Extraocular muscles intact.  HEENT: Head atraumatic, normocephalic. Oropharynx and nasopharynx clear.  NECK:  Supple, no jugular venous distention. No thyroid enlargement, no tenderness.  LUNGS: Normal breath sounds bilaterally, no wheezing, rales, rhonchi. No use of accessory muscles of respiration.  CARDIOVASCULAR: S1, S2 normal. No murmurs, rubs, or gallops.  ABDOMEN: Soft, nontender, nondistended. Bowel sounds present. No organomegaly or mass.  EXTREMITIES: No cyanosis, clubbing or edema b/l.    NEUROLOGIC: Disoriented, reflexes are 2+ PSYCHIATRIC:  Disoriented, hallucinating SKIN: No obvious rash, lesion, or ulcer.    LABORATORY PANEL:   CBC  Recent Labs Lab 09/18/14 0448  WBC 7.4  HGB 13.3  HCT 40.4  PLT 236   ------------------------------------------------------------------------------------------------------------------  Chemistries   Recent Labs Lab 09/17/14 0329  NA 136  K 4.0  CL 100*  CO2 31  GLUCOSE 150*  BUN  10  CREATININE 0.93  CALCIUM 9.0   ------------------------------------------------------------------------------------------------------------------  Cardiac Enzymes No results for input(s): TROPONINI in the last 168 hours. ------------------------------------------------------------------------------------------------------------------  RADIOLOGY:  Ct Abdomen Pelvis W Contrast  09/19/2014   CLINICAL DATA:  Diarrhea, fatigue, urinary tract infection, history of lymphoma per review of the electronic medical record. The patient was pre-medicated according to standard protocol before administration of IV contrast, with no ill effect after today's exam.  EXAM: CT ABDOMEN AND PELVIS WITH CONTRAST  TECHNIQUE: Multidetector CT imaging of the abdomen and pelvis was performed using the standard protocol following bolus administration of intravenous contrast.  CONTRAST:  159mL OMNIPAQUE IOHEXOL 300 MG/ML  SOLN  COMPARISON:  05/17/2014  FINDINGS: Lower chest: Small bilateral effusions with associated compressive atelectasis noted. Imaging degraded by patient body habitus, noise artifact, and motion.  Hepatobiliary: Liver appears normal. Cholecystectomy clips are noted. No intrahepatic or common duct dilatation.  Pancreas: Normal  Spleen: Normal  Adrenals/Urinary Tract: Adrenal glands appear normal. Minimal renal cortical thinning noted without focal lesion or hydroureteronephrosis. No radiopaque renal, ureteral, or bladder calculus.  Stomach/Bowel: Stomach appears normal. Appendix not visualized but no secondary evidence for appendicitis is identified. No bowel wall thickening or focal segmental dilatation is identified.  Vascular/Lymphatic: Moderate atheromatous aortic calcification without aneurysm. Retro aortic left renal vein. No lymphadenopathy.  Other: Uterus and ovaries appear normal. Streak artifact from left dynamic femoral nail.  Musculoskeletal: Multilevel disc degenerative change is noted with evidence  of previous vertebral augmentation at L2, with stable compression deformities at L1-L2.  IMPRESSION: No acute intra-abdominal or pelvic pathology.  Small bilateral pleural  effusions with associated compressive atelectasis.   Electronically Signed   By: Conchita Paris M.D.   On: 09/19/2014 10:40   Dg Fluoro Guide Lumbar Puncture  09/19/2014   CLINICAL DATA:  Altered mental status  EXAM: DIAGNOSTIC LUMBAR PUNCTURE UNDER FLUOROSCOPIC GUIDANCE  FLUOROSCOPY TIME:  Radiation Exposure Index (as provided by the fluoroscopic device): 5.5 mGy  PROCEDURE: Informed consent was obtained from the patient's daughter prior to the procedure, including potential complications of headache, allergy, and pain. With the patient prone, the lower back was prepped with Betadine. 1% Lidocaine was used for local anesthesia. Lumbar puncture was performed at the L4-5 level using a 22 gauge needle with return of clear CSF. 9 ml of CSF were obtained for laboratory studies. The patient tolerated the procedure well and there were no apparent complications.  IMPRESSION: Successful fluoroscopic guided diagnostic lumbar puncture.   Electronically Signed   By: Kathreen Devoid   On: 09/19/2014 12:44     ASSESSMENT AND PLAN:   1. Acute encephalopathy. Unclear etiology, can be acute viral syndrome with dehydration, rule out UTI Clinical situation is not improving with IVF and empiric antibiotics All labs are normal including urinalysis. Urine cx not done by lab so far, reordered   LP done 5/25 - clear fluid, gm stain, cx pending CT ABD N Pelvis nml  Paraneoplastic panel sent Appreciate neuro/onco and ID input Palliative care consult placed as daughter is agreeable CT head and MRI  negative for acute IC pathology, no history of fever and patient does not look septic  -TSH,B12, HIV are nml, ESR and CRP elevated -on empiric abx rocephin and azithromycin, clinically not improving  not  at her baseline CT chest is ordered-which has revealed  moderate pleural effusion and pulmonary nodule but no pneumonia -? Related to chronic pain medications and trazodone -currently trazodone on hold, resuming oxycodone 5 mg every 12 hours as needed as per daughter's request -EEG -slow response, neurology  Repeated  EEG -patient received  Thiamin  300 mg IV for 3 days, completed yesterday 5/21 -on  IV fluids as patient is with a poor by mouth intake -Check ANCA and thyroglobulin-results are nml  2. Hypertension, stable on home medications.  3. History of hypothyroidism, on levothyroxine.  -TSH normal  4. Atrial fibrillation with controlled ventricular rate, likely chronic. No acute problems at present. Per patient's daughter, patient refused cardiac workup in the past.  5. Chronic backache/spinal stenosis, patient on chronic pain meds when necessary. We will hold off chronic pain medications for now, continue Tylenol.  6. History of lymphoma, no acute problems. Seen by onco   7. Gen weakness- continue  PT  8. Possible depression-Dr. clapacs added Zoloft but discontinued that in view of hallucinations though generally it is not an adverse affect of Zoloft. Hallucinations are better   Case discussed with Care Management/Social Worker. Management plans discussed with the patient's daughter and she is in agreement.  CODE STATUS DNR  DVT Prophylaxis: lovenox suspended for LP/SCDS  TOTAL TIME TAKING CARE OF THIS PATIENT: 35 minutes.    D/C  DEPENDING ON CLINICAL CONDITION.   Nicholes Mango M.D on 09/19/2014 at 8:26 PM  Between 7am to 6pm - Pager - [phone]  After 6pm go to [email or web address] - password [email or web address]  [facility]  Office  [phone] for now  CC: Primary care physician; Lavera Guise, MD

## 2014-09-19 NOTE — Progress Notes (Signed)
Pt alert and talking but still remaining confused. Pt for abdominal CT in the morning. Incontanant of urine this shift. Iv infusing without difficulty.   Jeanie Sewer RN

## 2014-09-19 NOTE — Consult Note (Signed)
CC: Altered mental status  HPI: [name] is an 81 y.o. 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  No history of any fever, chills, shortness of breath, chest pain, focal weakness or numbness, nausea, vomiting, diarrhea, abdominal pain, dysuria. History of recent UTI and treated with Cipro which she completed recently.  Through out the hospital course pt's mentation has been waxing and wayning, able to follow commands a times but at same time sleeping a lot. MRI brain 5/18 no acute intracranial abnormality.     Mentation is improved today. Confused on location but able to follow commands.  Repeat EEG shows generalized slowing.  Pt is going to IR for LP today.   Past Medical History  Diagnosis Date  . Hypertension   . Thyroid disorder   . Lymphoma   . Hypothyroidism   . Arthritis   . Diverticulosis   . Obstructive sleep apnea   . Myasthenia gravis   . Hip fracture, left   . Fracture of right humerus   . Asthma     Past Surgical History  Procedure Laterality Date  . Nm pet lymphoma    . Hernia repair    . Cholecystectomy    . Back surgery      Family History  Problem Relation Age of Onset  . Heart Problems Mother   . Cancer Father     Social History:  reports that she has never smoked. She has never used smokeless tobacco. She reports that she does not drink alcohol or use illicit drugs.  Allergies  Allergen Reactions  . Betadine [Povidone Iodine] Hives  . Sulfa Antibiotics   . Valium [Diazepam] Anxiety    Medications: I have reviewed the patient's current medications.  ROS: Unable to obtain   Physical Examination: Blood pressure 125/73, pulse 90, temperature 97.9 F (36.6 C), temperature source Axillary, resp. rate 18, height 5\' 1"   (1.549 m), weight 91.627 kg (202 lb), SpO2 96 %.  Sedated, does not follow commands Opens eyes to painful stimuli and withdraws from painful stimuli.    Laboratory Studies:   Basic Metabolic Panel:  Recent Labs Lab 09/16/14 0432 09/17/14 0329  NA  --  136  K  --  4.0  CL  --  100*  CO2  --  31  GLUCOSE  --  150*  BUN  --  10  CREATININE 1.04* 0.93  CALCIUM  --  9.0    Liver Function Tests: No results for input(s): AST, ALT, ALKPHOS, BILITOT, PROT, ALBUMIN in the last 168 hours. No results for input(s): LIPASE, AMYLASE in the last 168 hours.  Recent Labs Lab 09/12/14 1919  AMMONIA 16    CBC:  Recent Labs Lab 09/14/14 0442 09/17/14 0329 09/18/14 0448  WBC 8.5 9.1 7.4  HGB 13.1 13.9 13.3  HCT 40.6 41.1 40.4  MCV 90.2 90.0 90.4  PLT 216 229 236    Cardiac Enzymes: No results for input(s): CKTOTAL, CKMB, CKMBINDEX, TROPONINI in the last 168 hours.  BNP: Invalid input(s): POCBNP  CBG: No results for input(s): GLUCAP in the last 168 hours.  Microbiology: Results for orders placed or performed during the hospital encounter of 09/11/14  Culture, blood (routine x 2)     Status: None   Collection Time: 09/13/14  7:50 PM  Result Value Ref Range Status   Specimen Description BLOOD  Final   Special Requests Normal  Final   Culture NO GROWTH 5 DAYS  Final   Report Status 09/18/2014 FINAL  Final  Culture, blood (routine x 2)     Status: None   Collection Time: 09/13/14  7:50 PM  Result Value Ref Range Status   Specimen Description BLOOD  Final   Special Requests Normal  Final   Culture NO GROWTH 5 DAYS  Final   Report Status 09/18/2014 FINAL  Final    Coagulation Studies:  Recent Labs  09/19/14 0757  LABPROT 13.4  INR 1.00    Urinalysis:   Recent Labs Lab 09/19/14 0455  COLORURINE COLORLESS*  LABSPEC 1.002*  PHURINE 7.0  GLUCOSEU NEGATIVE  HGBUR NEGATIVE  BILIRUBINUR NEGATIVE  KETONESUR NEGATIVE  PROTEINUR NEGATIVE  NITRITE NEGATIVE   LEUKOCYTESUR NEGATIVE    Lipid Panel:  No results found for: CHOL, TRIG, HDL, CHOLHDL, VLDL, LDLCALC  HgbA1C: No results found for: HGBA1C  Urine Drug Screen:  No results found for: LABOPIA, COCAINSCRNUR, LABBENZ, AMPHETMU, THCU, LABBARB  Alcohol Level: No results for input(s): ETH in the last 168 hours.  Other results: EKG: A-fib.  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  No history of any fever, chills, shortness of breath, chest pain, focal weakness or numbness, nausea, vomiting, diarrhea, abdominal pain, dysuria. History of recent UTI and treated with Cipro which she completed recently.  Through out the hospital course pt's mentation has been waxing and wayning, able to follow commands a times but at same time sleeping a lot. MRI brain 5/18 no acute intracranial abnormality.     More awake today, follows minimal commands, family at bedside.     - awaiting LP to look for cytology for ? Possibility of carcinomatous meningitis even though I think this is unlikely - paraneoplastic panel sent - delirium started seroquel and keep awake during the day.  - would agree on adding cryptococcal antigen on LP.    [name]   09/19/2014, 12:07 PM

## 2014-09-19 NOTE — Progress Notes (Signed)
Spoke to Dr. Lavetta Nielsen, pt has been inconant all night  And not able to give urine sp icemen. IN and out catheter ordrered.

## 2014-09-19 NOTE — Progress Notes (Signed)
PHARMACIST - PHYSICIAN COMMUNICATION  CONCERNING: Antibiotic IV to Oral Route Change Policy  RECOMMENDATION: This patient is receiving Azithromycin by the intravenous route.  Based on criteria approved by the Pharmacy and Therapeutics Committee, the antibiotic(s) is/are being converted to the equivalent oral dose form(s).   DESCRIPTION: These criteria include:  Patient being treated for a respiratory tract infection, urinary tract infection, cellulitis or clostridium difficile associated diarrhea if on metronidazole  The patient is not neutropenic and does not exhibit a GI malabsorption state  The patient is eating (either orally or via tube) and/or has been taking other orally administered medications for a least 24 hours  The patient is improving clinically and has a Tmax < 100.5  If you have questions about this conversion, please contact the Pharmacy Department  []   [phone] )  [facility] [x]   [phone] )  [facility] []   [phone] )  [facility] []   [phone] )  [facility] []   [phone] )  [place], PharmD Clinical Pharmacist 09/19/2014

## 2014-09-19 NOTE — Consult Note (Signed)
Palliative Medicine Inpatient Consult Follow Up Note   Name: [name] Date: 09/19/2014 MRN: [id number]  DOB: Sep 24, 1929  Referring Physician: Nicholes Mango, MD  Palliative Care consult requested for this 81 y.o. female for goals of medical therapy in patient with h/o lymphoma, myasthenia gravis, OP, spinal stenosis, admitted with altered mental status  [name] is sitting up in bed. More confused than yesterday. Denies pain or other symptoms. Family at bedside.   REVIEW OF SYSTEMS:  Pain: None Dyspnea:  No Nausea/Vomiting:  No Diarrhea:  No Constipation:   No Depression:   No Anxiety:   No Fatigue:   No  CODE STATUS: DNR   PAST MEDICAL HISTORY: Past Medical History  Diagnosis Date  . Hypertension   . Thyroid disorder   . Lymphoma   . Hypothyroidism   . Arthritis   . Diverticulosis   . Obstructive sleep apnea   . Myasthenia gravis   . Hip fracture, left   . Fracture of right humerus   . Asthma     PAST SURGICAL HISTORY:  Past Surgical History  Procedure Laterality Date  . Nm pet lymphoma    . Hernia repair    . Cholecystectomy    . Back surgery      Vital Signs: BP 125/73 mmHg  Pulse 90  Temp(Src) 97.9 F (36.6 C) (Axillary)  Resp 18  Ht 5\' 1"  (1.549 m)  Wt 91.627 kg (202 lb)  BMI 38.19 kg/m2  SpO2 96% Filed Weights   09/17/14 0540 09/18/14 0300 09/19/14 0500  Weight: 93.26 kg (205 lb 9.6 oz) 94.25 kg (207 lb 12.5 oz) 91.627 kg (202 lb)    Estimated body mass index is 38.19 kg/(m^2) as calculated from the following:   Height as of this encounter: 5\' 1"  (1.549 m).   Weight as of this encounter: 91.627 kg (202 lb).  PHYSICAL EXAM: General: NAD HEENT: OP clear, moist oral mucosa Neck: Trachea midline  Cardiovascular: irregular rate and rhythm Pulmonary/Chest: Clear ant fields Abdominal: Soft, NTTP, + bowel sounds GU: No SP tenderness Extremities: + edema  Neurological: moves L.UE, unable to lift R.UE, 3+ motor LE's Skin: no  rashes Psychiatric: alert, disoriented LABS: CBC:    Component Value Date/Time   WBC 7.4 09/18/2014 0448   WBC 9.8 05/17/2014 1834   HGB 13.3 09/18/2014 0448   HGB 11.6* 05/17/2014 1834   HCT 40.4 09/18/2014 0448   HCT 36.6 05/17/2014 1834   PLT 236 09/18/2014 0448   PLT 242 05/17/2014 1834   MCV 90.4 09/18/2014 0448   MCV 91 05/17/2014 1834   NEUTROABS 5.2 09/11/2014 2233   NEUTROABS 6.2 05/17/2014 1834   LYMPHSABS 1.6 09/11/2014 2233   LYMPHSABS 2.6 05/17/2014 1834   MONOABS 0.9 09/11/2014 2233   MONOABS 0.8 05/17/2014 1834   EOSABS 0.2 09/11/2014 2233   EOSABS 0.2 05/17/2014 1834   BASOSABS 0.1 09/11/2014 2233   BASOSABS 0.1 05/17/2014 1834   BASOSABS 1 03/22/2014 0530   Comprehensive Metabolic Panel:    Component Value Date/Time   NA 136 09/17/2014 0329   NA 137 05/17/2014 1834   K 4.0 09/17/2014 0329   K 4.4 05/17/2014 1834   CL 100* 09/17/2014 0329   CL 100 05/17/2014 1834   CO2 31 09/17/2014 0329   CO2 30 05/17/2014 1834   BUN 10 09/17/2014 0329   BUN 15 05/17/2014 1834   CREATININE 0.93 09/17/2014 0329   CREATININE 1.13 05/17/2014 1834   GLUCOSE 150* 09/17/2014 0329  GLUCOSE 99 05/17/2014 1834   CALCIUM 9.0 09/17/2014 0329   CALCIUM 8.7 05/17/2014 1834   AST 22 09/11/2014 2233   AST 39* 05/17/2014 1834   ALT 21 09/11/2014 2233   ALT 24 05/17/2014 1834   ALKPHOS 72 09/11/2014 2233   ALKPHOS 82 05/17/2014 1834   BILITOT 0.6 09/11/2014 2233   PROT 6.6 09/11/2014 2233   PROT 6.5 05/17/2014 1834   ALBUMIN 2.9* 09/11/2014 2233   ALBUMIN 2.6* 05/17/2014 1834    IMPRESSION: Christy Despain is an 81 yo woman with PMH of HTN, hyperlipidemia, hypothyroidism, B-cell lymphoma, myasthenia gravis, osteoporosis with h/o fragility fx (L.hip, R.arm), OA with spinal stenosis, OSA, diverticulosis, s/p cholecystectomy and hernia repair. She was admitted 09/11/14 with 1-2 weeks of intermittent altered mental status. Pt has had extensive work-up including MRI, EEG & chest CT  without etiology of delirium. Pt had LP today, results thus far unrevealing. CT abd/pelvis negative.  Pt more confused today than yesterday. I spoke with daughter, [name], by phone. Updated her on latest tests. Will follow up tomorrow.   PLAN: As above   More than 50% of the visit was spent in counseling/coordination of care: YES  Time spent: 35 minutes

## 2014-09-19 NOTE — Care Management Note (Signed)
Case Management Note  Patient Details  Name: [name] MRN: [id number] Date of Birth: 02-20-1930  Subjective/Objective:     Scheduled for Lumbar puncture and CT abdomen/pelvis today. Remains confused with delusions, intermittent agitation. . Seroquel increased to TID per psychiatry. CSW working with daughter on the ALF with hospice verses private pay SNF with hospice. RNCM following for discharge planning.                Action/Plan:   Expected Discharge Date:                  Expected Discharge Plan:  Assisted Living / Rest Home  In-House Referral:  Clinical Social Work  Discharge planning Services  CM Consult  Post Acute Care Choice:    Choice offered to:     DME Arranged:    DME Agency:     HH Arranged:    HH Agency:     Status of Service:  In process, will continue to follow  Medicare Important Message Given:    Date Medicare IM Given:    Medicare IM give by:    Date Additional Medicare IM Given:    Additional Medicare Important Message give by:     If discussed at [place] of Stay Meetings, dates discussed:    Additional Comments:  Jolly Mango, RN 09/19/2014, 9:40 AM

## 2014-09-19 NOTE — Consult Note (Signed)
  Psychiatry: Follow-up for this patient with rapid onset dementia and intermittent delirium. Spoke with the patient and her other daughter today. Patient says that she thinks that she is feeling better today. She says her mood is good. She initially denied to me feeling paranoid. The more we spoke however she started to make some bizarre statements. Indicated that she believed that she was going to prison after she left the hospital. Affect was incongruent with this.  Daughter reports that she is only observed her briefly doesn't see anything worse than yesterday but not necessarily dramatically better.  Patient is alert and oriented 4. Remembered me. Appropriate in her interactions. Calm affect. Unfortunately she still has some bizarre thinking.  Cause of this is still not entirely clear. Patient seems a little bit better on her current regimen including the low dose of Seroquel. I am still hesitant to add a restart anything else that might worsen her delirium. I will continue to follow-up tomorrow. Case briefly discussed with Dr. Vallery Ridge as well.

## 2014-09-19 NOTE — Progress Notes (Signed)
Physical Therapy Treatment Patient Details Name: [name] MRN: [id number] DOB: [birth year] Today's Date: 09/19/2014    History of Present Illness presented to ER from ALF secondary to decreased alertness, AMS x1-2 weeks; admitted with acute encephalopathy of unknown etiology (? PNA ?)    PT Comments    Pt demonstrates improved cognition today. She is AOx1 but is able to consistently follow simple commands 60-70% of the time which is adequate to complete PT treatment session. Pt has poor safety awareness during ambulation and requires +2 for safety as well as +3 present to bring up chair when pt decides to sit down. She does become mildly agitated but is easily calmed by family and therapist. Pt refuses to get up in recliner at this time but is able to complete bed exercises. Pt will benefit from continued skilled PT services to address deficits in strength, balance, and mobility in order to return to full function at home.    Follow Up Recommendations  SNF     Equipment Recommendations       Recommendations for Other Services       Precautions / Restrictions Precautions Precautions: Fall Precaution Comments: No BP R UE Restrictions Weight Bearing Restrictions: No    Mobility  Bed Mobility Overal bed mobility: Needs Assistance Bed Mobility: Supine to Sit;Sit to Supine     Supine to sit: Mod assist Sit to supine: Mod assist   General bed mobility comments: Improved sequencing and use of bed rail during bed mobility  Transfers Overall transfer level: Needs assistance Equipment used: Rolling walker (2 wheeled) Transfers: Sit to/from Stand Sit to Stand: Min assist;+2 safety/equipment         General transfer comment: tends to pull up on RW. Cues for hand placement. Improved sequencing with repetition  Ambulation/Gait Ambulation/Gait assistance: Min assist;+2 safety/equipment (+2 for safety due to poor awareness and impulsiveness) Ambulation Distance (Feet): 15  Feet Assistive device: Rolling walker (2 wheeled) Gait Pattern/deviations: Step-to pattern;Decreased step length - right;Decreased step length - left;Shuffle Gait velocity: Slow Gait velocity interpretation: <1.8 ft/sec, indicative of risk for recurrent falls General Gait Details: 3 point gait, very slow speed with poor safety awareness. Knees become tremulous during ambulation and pt requires seated rest break halfway through ambulation. Pt becomes slightly agitated during ambulation and requires encouragement from therapist and family to complete ambulation.   Stairs            Wheelchair Mobility    Modified Rankin (Stroke Patients Only)       Balance Overall balance assessment: Modified Independent Sitting-balance support: No upper extremity supported;Feet unsupported Sitting balance-Leahy Scale: Good       Standing balance-Leahy Scale: Fair                      Cognition Arousal/Alertness: Awake/alert Behavior During Therapy: WFL for tasks assessed/performed Overall Cognitive Status: Impaired/Different from baseline (AOx1) Area of Impairment: Orientation;Memory;Following commands;Safety/judgement;Problem solving;Attention (Pt follows approximately 60-70% of commands today) Orientation Level: Disoriented to;Time;Situation Current Attention Level: Divided Memory: Decreased short-term memory Following Commands: Follows one step commands inconsistently (approximately 60-70% simple command follow) Safety/Judgement: Decreased awareness of deficits;Decreased awareness of safety   Problem Solving: Decreased initiation;Difficulty sequencing;Requires verbal cues;Requires tactile cues      Exercises General Exercises - Upper Extremity Shoulder Flexion: Strengthening;Both;10 reps;Seated Elbow Flexion: Strengthening;Both;10 reps;Seated Elbow Extension: Strengthening;Both;15 reps;Seated General Exercises - Lower Extremity Ankle Circles/Pumps: 15  reps;AROM;Both;Supine Short Arc Quad: Strengthening;Both;10 reps;Supine Heel Slides: Strengthening;Both;10 reps;Supine (resisted extension) Hip  ABduction/ADduction: Strengthening;Both;10 reps;Supine Straight Leg Raises: Strengthening;Both;10 reps;Supine    General Comments        Pertinent Vitals/Pain Pain Assessment: No/denies pain (No signs of grimacing or groaning during mobility)    Home Living                      Prior Function            PT Goals (current goals can now be found in the care plan section) Acute Rehab PT Goals Patient Stated Goal: patient unable to verbalize PT Goal Formulation: With patient Time For Goal Achievement: 09/28/14 Potential to Achieve Goals: Fair Progress towards PT goals: Progressing toward goals (Fair pregression)    Frequency  Min 2X/week    PT Plan Current plan remains appropriate    Co-evaluation             End of Session Equipment Utilized During Treatment: Gait belt Activity Tolerance: Patient tolerated treatment well Patient left: with call bell/phone within reach;with family/visitor present;in bed;with bed alarm set (Refuses up to chair)     Time: 3818-2993 PT Time Calculation (min) (ACUTE ONLY): 25 min  Charges:  $Gait Training: 8-22 mins $Therapeutic Exercise: 8-22 mins                    G Codes:      Adiana Smelcer 10-09-14, 4:26 PM

## 2014-09-19 NOTE — Progress Notes (Signed)
Date of Admission:  09/11/2014     ID: [name] is a 81 y.o. female with  AMS  Principal Problem:   Acute delirium Active Problems:   Acute encephalopathy   HTN (hypertension)   Hypothyroidism   A-fib   Encephalopathy acute  Subjective: Remains confused. No fever. LP done today. Talking about feeling like God is talking to her and telling her that he is coming soon. Says all the tradegies happening are a sign for [place] to get back to our religion.  Says she doesn't like the word religion. When asked what word she likes she says "body"  Medications:  . aspirin EC  81 mg Oral Daily  . atenolol  25 mg Oral Daily  . benazepril  20 mg Oral Daily   And  . hydrochlorothiazide  12.5 mg Oral Daily  . docusate sodium  100 mg Oral BID  . feeding supplement (ENSURE ENLIVE)  237 mL Oral BID BM  . levothyroxine  75 mcg Oral QAC breakfast  . QUEtiapine  25 mg Oral TID    Objective: Vital signs in last 24 hours: Temp:  [97.9 F (36.6 C)-98 F (36.7 C)] 97.9 F (36.6 C) (05/25 0753) Pulse Rate:  [66-93] 90 (05/25 1041) Resp:  [18-19] 18 (05/25 0753) BP: (125-165)/(71-93) 125/73 mmHg (05/25 1041) SpO2:  [94 %-96 %] 96 % (05/25 0753) Weight:  [91.627 kg (202 lb)] 91.627 kg (202 lb) (05/25 0500) Constitutional: Awake, pleasant interactive but cannot name where she is or tell her story. appears well-developed and well-nourished. No distress. obese HENT: [place]/AT, PERRLA, no scleral icterus Mouth/Throat: Oropharynx is clear and moist. No oropharyngeal exudate.  Cardiovascular: Normal rate, regular rhythm and normal heart sounds. Exam reveals no gallop and no friction rub.  No murmur heard.  Pulmonary/Chest: Effort normal and breath sounds normal. No respiratory distress. has no wheezes.  Neck = supple, no nuchal rigidity Abdominal: Soft. Bowel sounds are normal. exhibits no distension. There is no tenderness.  Lymphadenopathy: no cervical adenopathy. No axillary  adenopathy Neurological: alert and interactive. But when asked where is she states [employer],  thinks it is the 1880, winter outside Can name self and knows her dob Skin: Skin is warm and dry. No rash noted. No erythema.  Psychiatric: as above behavior is somewhat suspicious Lab Results  Recent Labs  09/17/14 0329 09/18/14 0448  WBC 9.1 7.4  HGB 13.9 13.3  HCT 41.1 40.4  NA 136  --   K 4.0  --   CL 100*  --   CO2 31  --   BUN 10  --   CREATININE 0.93  --    Liver Panel No results for input(s): PROT, ALBUMIN, AST, ALT, ALKPHOS, BILITOT, BILIDIR, IBILI in the last 72 hours. Sedimentation Rate No results for input(s): ESRSEDRATE in the last 72 hours. C-Reactive Protein No results for input(s): CRP in the last 72 hours.  Microbiology: Results for orders placed or performed during the hospital encounter of 09/11/14  Culture, blood (routine x 2)     Status: None   Collection Time: 09/13/14  7:50 PM  Result Value Ref Range Status   Specimen Description BLOOD  Final   Special Requests Normal  Final   Culture NO GROWTH 5 DAYS  Final   Report Status 09/18/2014 FINAL  Final  Culture, blood (routine x 2)     Status: None   Collection Time: 09/13/14  7:50 PM  Result Value Ref Range Status   Specimen Description  BLOOD  Final   Special Requests Normal  Final   Culture NO GROWTH 5 DAYS  Final   Report Status 09/18/2014 FINAL  Final    Studies/Results: Ct Abdomen Pelvis W Contrast  09/19/2014   CLINICAL DATA:  Diarrhea, fatigue, urinary tract infection, history of lymphoma per review of the electronic medical record. The patient was pre-medicated according to standard protocol before administration of IV contrast, with no ill effect after today's exam.  EXAM: CT ABDOMEN AND PELVIS WITH CONTRAST  TECHNIQUE: Multidetector CT imaging of the abdomen and pelvis was performed using the standard protocol following bolus administration of intravenous contrast.  CONTRAST:  137mL OMNIPAQUE  IOHEXOL 300 MG/ML  SOLN  COMPARISON:  05/17/2014  FINDINGS: Lower chest: Small bilateral effusions with associated compressive atelectasis noted. Imaging degraded by patient body habitus, noise artifact, and motion.  Hepatobiliary: Liver appears normal. Cholecystectomy clips are noted. No intrahepatic or common duct dilatation.  Pancreas: Normal  Spleen: Normal  Adrenals/Urinary Tract: Adrenal glands appear normal. Minimal renal cortical thinning noted without focal lesion or hydroureteronephrosis. No radiopaque renal, ureteral, or bladder calculus.  Stomach/Bowel: Stomach appears normal. Appendix not visualized but no secondary evidence for appendicitis is identified. No bowel wall thickening or focal segmental dilatation is identified.  Vascular/Lymphatic: Moderate atheromatous aortic calcification without aneurysm. Retro aortic left renal vein. No lymphadenopathy.  Other: Uterus and ovaries appear normal. Streak artifact from left dynamic femoral nail.  Musculoskeletal: Multilevel disc degenerative change is noted with evidence of previous vertebral augmentation at L2, with stable compression deformities at L1-L2.  IMPRESSION: No acute intra-abdominal or pelvic pathology.  Small bilateral pleural effusions with associated compressive atelectasis.   Electronically Signed   By: Conchita Paris M.D.   On: 09/19/2014 10:40   Dg Fluoro Guide Lumbar Puncture  09/19/2014   CLINICAL DATA:  Altered mental status  EXAM: DIAGNOSTIC LUMBAR PUNCTURE UNDER FLUOROSCOPIC GUIDANCE  FLUOROSCOPY TIME:  Radiation Exposure Index (as provided by the fluoroscopic device): 5.5 mGy  PROCEDURE: Informed consent was obtained from the patient's daughter prior to the procedure, including potential complications of headache, allergy, and pain. With the patient prone, the lower back was prepped with Betadine. 1% Lidocaine was used for local anesthesia. Lumbar puncture was performed at the L4-5 level using a 22 gauge needle with return of  clear CSF. 9 ml of CSF were obtained for laboratory studies. The patient tolerated the procedure well and there were no apparent complications.  IMPRESSION: Successful fluoroscopic guided diagnostic lumbar puncture.   Electronically Signed   By: Kathreen Devoid   On: 09/19/2014 12:44     Assessment/Plan: AUBREY BLACKARD is a 81 y.o. female Htn Myasthenia gravis, prior lymphoma, hypothyroidism, OSA, with femur fracture approx 6 months ago s/p ORIF. She was brought in by family 5/18 with progressive confusion over several days to weeks to the point of being unable to be awoken. Prior to her fall and fx she lived alone and had no obvious signs of dementia. Was followed by neuro for MG but stopped meds for this several months ago. After fall was at rehab then to an assisted living facility where she was able to transfer to her chair, toilet and feed herself until this admission.  Since admission bcx, ucx neg, ct chest neg for pna, MRI brain neg for acute findings. She has no evidence of active bacterial infection at this point with no fever or leukocytosis. She may have a viral encephalopathy but I think this would be unlikely.  LP with no wbc, other tests pending.  RPR and HIV negative  Rec Added on HSV PCR to CSF Stop abx  Continue to follow. Discussed with patient grandchildren and daughter.  [place], [place]   09/19/2014, 3:21 PM

## 2014-09-20 LAB — CBC
HCT: 39.6 % (ref 35.0–47.0)
Hemoglobin: 12.9 g/dL (ref 12.0–16.0)
MCH: 29.5 pg (ref 26.0–34.0)
MCHC: 32.7 g/dL (ref 32.0–36.0)
MCV: 90.5 fL (ref 80.0–100.0)
Platelets: 264 10*3/uL (ref 150–440)
RBC: 4.38 MIL/uL (ref 3.80–5.20)
RDW: 15 % — ABNORMAL HIGH (ref 11.5–14.5)
WBC: 9.5 10*3/uL (ref 3.6–11.0)

## 2014-09-20 LAB — BASIC METABOLIC PANEL
Anion gap: 8 (ref 5–15)
BUN: 16 mg/dL (ref 6–20)
CO2: 30 mmol/L (ref 22–32)
Calcium: 8.9 mg/dL (ref 8.9–10.3)
Chloride: 100 mmol/L — ABNORMAL LOW (ref 101–111)
Creatinine, Ser: 0.96 mg/dL (ref 0.44–1.00)
GFR calc Af Amer: >60 mL/min (ref >60)
GFR calc non Af Amer: 53 mL/min — ABNORMAL LOW (ref >60)
GLUCOSE: 118 mg/dL — AB (ref 65–99)
Potassium: 3.9 mmol/L (ref 3.5–5.1)
Sodium: 138 mmol/L (ref 135–145)

## 2014-09-20 LAB — HERPES SIMPLEX VIRUS(HSV) DNA BY PCR
HSV 1 DNA: NEGATIVE
HSV 2 DNA: NEGATIVE

## 2014-09-20 MED ORDER — QUETIAPINE FUMARATE 25 MG PO TABS: 25.0000 mg | ORAL_TABLET | Freq: Three times a day (TID) | ORAL | Status: DC

## 2014-09-20 MED ORDER — OXYCODONE HCL 5 MG PO TABS: 5.0000 mg | ORAL_TABLET | Freq: Two times a day (BID) | ORAL | Status: DC | PRN

## 2014-09-20 MED ORDER — ENSURE ENLIVE PO LIQD: 237.0000 mL | Freq: Two times a day (BID) | ORAL | Status: DC

## 2014-09-20 NOTE — Discharge Summary (Signed)
[facility] [place] NAME: [name]    MR#:  [id number]  DATE OF BIRTH:  1929-11-16  DATE OF ADMISSION:  09/11/2014 ADMITTING PHYSICIAN: Juluis Mire, MD  DATE OF DISCHARGE: 09/20/2014 PRIMARY CARE PHYSICIAN: Lavera Guise, MD    ADMISSION DIAGNOSIS:  Altered mental status, unspecified altered mental status type [R41.82]  DISCHARGE DIAGNOSIS:  Principal Problem:   Acute delirium, unclear etiology, PCP to follow up on the CSF culture results Active Problems:   Acute encephalopathy   HTN (hypertension)   Hypothyroidism   A-fib   Encephalopathy acute   SECONDARY DIAGNOSIS:   Past Medical History  Diagnosis Date  . Hypertension   . Thyroid disorder   . Lymphoma   . Hypothyroidism   . Arthritis   . Diverticulosis   . Obstructive sleep apnea   . Myasthenia gravis   . Hip fracture, left   . Fracture of right humerus   . Asthma     HOSPITAL COURSE:   Brief history and physical  Patient is brought into the ED with a chief complaint of altered mental status. Please review history and physical for complete details.  Hospital course  1. Acute encephalopathy. Unclear etiology, can be acute viral syndrome with dehydration, ruled out UTI Patient had prolonged hospital course with no clear etiology for her acute encephalopathy  Clinical situation is finally better with IVF and empiric antibiotics, will discontinue IV fluids and antibiotics at this point All labs are normal including urinalysis. Urine cx normal so far  LP done 5/25 - clear fluid, gm stain, cx pending, PCP needs to follow up on that CT ABD N Pelvis nml  Paraneoplastic panel sent, PCP to follow up . Appreciate neuro/onco and ID input Palliative care consult placed as daughter is agreeable CT head and MRI negative for acute IC pathology, no history of fever and patient does not look septic  -TSH,B12, HIV are nml, ESR and CRP elevated -on empiric  abx rocephin and azithromycin, clinically not improving not at her baseline CT chest is ordered-which has revealed moderate pleural effusion and pulmonary nodule but no pneumonia -? Related to chronic pain medications and trazodone -currently trazodone on hold, resuming oxycodone 5 mg every 12 hours as needed as per daughter's request -EEG -slow response, neurology Repeated EEG -patient received Thiamin 300 mg IV for 3 days, completed yesterday 5/21 -- ANCA and thyroglobulin-results are nml  2. Hypertension, stable on home medications.  3. History of hypothyroidism, on levothyroxine.  -TSH normal  4. Atrial fibrillation with controlled ventricular rate, likely chronic. No acute problems at present. Per patient's daughter, patient refused cardiac workup in the past.  5. Chronic backache/spinal stenosis, patient on chronic pain meds when necessary. We will hold off chronic pain medications for now, continue Tylenol.  6. History of lymphoma, no acute problems. Outpatient follow-up with onco   7. Gen weakness- continue PT  8. Possible depression-Dr. clapacs added Zoloft but discontinued that in view of hallucinations though generally it is not an adverse affect of Zoloft. Hallucinations are better  Physical therapy has recommended skilled nursing care, transfer patient to [facility]:  Satisfactory  CONSULTS OBTAINED:  Treatment Team:  Valora Corporal, MD Gonzella Lex, MD Nicholes Mango, MD Adrian Prows, MD  Palliative care Dr. Junius Finner Oncology Dr. Grayland Ormond   PROCEDURES lumbar puncture  DRUG ALLERGIES:   Allergies  Allergen Reactions  . Betadine [Povidone  Iodine] Hives  . Sulfa Antibiotics   . Valium [Diazepam] Anxiety    DISCHARGE MEDICATIONS:   Current Discharge Medication List    START taking these medications   Details  feeding supplement, ENSURE ENLIVE, (ENSURE ENLIVE) LIQD Take 237 mLs by mouth 2 (two) times  daily between meals. Qty: 237 mL, Refills: 12    oxyCODONE (OXY IR/ROXICODONE) 5 MG immediate release tablet Take 1 tablet (5 mg total) by mouth 2 (two) times daily as needed for moderate pain or severe pain. Qty: 15 tablet, Refills: 0    QUEtiapine (SEROQUEL) 25 MG tablet Take 1 tablet (25 mg total) by mouth 3 (three) times daily. Qty: 30 tablet, Refills: 0      CONTINUE these medications which have NOT CHANGED   Details  acetaminophen (TYLENOL) 325 MG tablet Take 650 mg by mouth every 4 (four) hours as needed for mild pain.    albuterol (PROVENTIL HFA;VENTOLIN HFA) 108 (90 BASE) MCG/ACT inhaler Inhale 2 puffs into the lungs every 6 (six) hours as needed for shortness of breath.    aspirin EC 81 MG tablet Take 81 mg by mouth daily.    atenolol (TENORMIN) 25 MG tablet Take 25 mg by mouth daily.     benazepril-hydrochlorthiazide (LOTENSIN HCT) 20-12.5 MG per tablet Take 1 tablet by mouth daily.     calcium-vitamin D (OSCAL WITH D) 500-200 MG-UNIT per tablet Take 1 tablet by mouth 2 (two) times daily.    carboxymethylcellulose (REFRESH PLUS) 0.5 % SOLN Place 1 drop into both eyes as needed.    clotrimazole-betamethasone (LOTRISONE) cream as needed.     docusate sodium (COLACE) 100 MG capsule Take 100 mg by mouth 2 (two) times daily.    ferrous sulfate 325 (65 FE) MG tablet Take 325 mg by mouth 2 (two) times daily with a meal.    levothyroxine (SYNTHROID, LEVOTHROID) 75 MCG tablet Take 75 mcg by mouth daily.     lidocaine (XYLOCAINE) 5 % ointment Apply 1 application topically every 4 (four) hours as needed (rectal pain).    menthol-cetylpyridinium (CEPACOL) 3 MG lozenge Take 1 lozenge by mouth as needed for sore throat.    Multiple Vitamins-Minerals (MULTIVITAMIN WITH MINERALS) tablet Take 1 tablet by mouth daily.    polyethylene glycol (MIRALAX / GLYCOLAX) packet Take 17 g by mouth daily.    tolterodine (DETROL LA) 4 MG 24 hr capsule Take 4 mg by mouth daily.      STOP  taking these medications     naproxen sodium (ANAPROX) 220 MG tablet      oxycodone (OXY-IR) 5 MG capsule      traZODone (DESYREL) 50 MG tablet          DISCHARGE INSTRUCTIONS:  Follow-up with primary care physician, primary care physician to follow up on CSF cultures results Follow-up with oncology paraneoplastic panel results need to be followed up by oncology  DIET:   low-salt diet, continue dietary supplements DISCHARGE CONDITION:  Satisfactory  ACTIVITY:  As tolerated, as recommended by physical therapy  OXYGEN:  Home Oxygen: no  DISCHARGE LOCATION:  Skilled nursing care at [facility]   If you experience worsening of your admission symptoms, develop shortness of breath, life threatening emergency, suicidal or homicidal thoughts you must seek medical attention immediately by calling 911 or calling your MD immediately  if symptoms less severe.  You Must read complete instructions/literature along with all the possible adverse reactions/side effects for all the Medicines you take and that have been prescribed  to you. Take any new Medicines after you have completely understood and accpet all the possible adverse reactions/side effects.   Please note  You were cared for by a hospitalist during your hospital stay. If you have any questions about your discharge medications or the care you received while you were in the hospital after you are discharged, you can call the unit and asked to speak with the hospitalist on call if the hospitalist that took care of you is not available. Once you are discharged, your primary care physician will handle any further medical issues. Please note that NO REFILLS for any discharge medications will be authorized once you are discharged, as it is imperative that you return to your primary care physician (or establish a relationship with a primary care physician if you do not have one) for your aftercare needs so that they can reassess your  need for medications and monitor your lab values.     Today  Chief Complaint  Patient presents with  . Altered Mental Status    patient is resting comfortably today. Patient's conversation is more cognitive today  ROS: unable to get complete review of systems CONSTITUTIONAL:  Denies any fatigue, weakness.  EYES: Denies blurry vision EARS, NOSE, THROAT: Denies tinnitus, ear pain.. RESPIRATORY: Denies cough, wheeze, shortness of breath.  CARDIOVASCULAR: Denies chest pain, palpitations, edema.  GASTROINTESTINAL: Denies nausea, vomiting, diarrhea, abdominal pain. Denies bright red blood per rectum. MUSCULOSKELETAL: Denies pain in neck, back, shoulder, knees, hips or arthritic symptoms.    VITAL SIGNS:  Blood pressure 147/102, pulse 54, temperature 97.4 F (36.3 C), temperature source Oral, resp. rate 18, height $RemoveBe'5\' 1"'otRRcfOYD$  (1.549 m), weight 90.719 kg (200 lb), SpO2 97 %.  I/O:   Intake/Output Summary (Last 24 hours) at 09/20/14 1501 Last data filed at 09/20/14 1300  Gross per 24 hour  Intake 3523.67 ml  Output      0 ml  Net 3523.67 ml    PHYSICAL EXAMINATION:  GENERAL:  81 y.o.-year-old patient lying in the bed with no acute distress.  EYES: Pupils equal, round, reactive to light and accommodation. No scleral icterus. Extraocular muscles intact.  HEENT: Head atraumatic, normocephalic. Oropharynx and nasopharynx clear.  NECK:  Supple, no jugular venous distention. No thyroid enlargement, no tenderness.  LUNGS: Normal breath sounds bilaterally, no wheezing, rales,rhonchi or crepitation. No use of accessory muscles of respiration.  CARDIOVASCULAR: S1, S2 normal. No murmurs, rubs, or gallops.  ABDOMEN: Soft, non-tender, non-distended. Bowel sounds present. No organomegaly or mass.  EXTREMITIES: No pedal edema, cyanosis, or clubbing.  NEUROLOGIC: More awake and alert today Sensation intact. Gait not checked.  PSYCHIATRIC: The patient is alert SKIN: No obvious rash, lesion, or ulcer.    DATA REVIEW:   CBC  Recent Labs Lab 09/20/14 0505  WBC 9.5  HGB 12.9  HCT 39.6  PLT 264    Chemistries   Recent Labs Lab 09/20/14 0505  NA 138  K 3.9  CL 100*  CO2 30  GLUCOSE 118*  BUN 16  CREATININE 0.96  CALCIUM 8.9    Cardiac Enzymes No results for input(s): TROPONINI in the last 168 hours.  Microbiology Results  Results for orders placed or performed during the hospital encounter of 09/11/14  Culture, blood (routine x 2)     Status: None   Collection Time: 09/13/14  7:50 PM  Result Value Ref Range Status   Specimen Description BLOOD  Final   Special Requests Normal  Final   Culture NO GROWTH  5 DAYS  Final   Report Status 09/18/2014 FINAL  Final  Culture, blood (routine x 2)     Status: None   Collection Time: 09/13/14  7:50 PM  Result Value Ref Range Status   Specimen Description BLOOD  Final   Special Requests Normal  Final   Culture NO GROWTH 5 DAYS  Final   Report Status 09/18/2014 FINAL  Final  Urine culture     Status: None (Preliminary result)   Collection Time: 09/19/14  4:55 AM  Result Value Ref Range Status   Specimen Description URINE, CLEAN CATCH  Final   Special Requests NONE  Final   Culture NO GROWTH < 24 HOURS  Final   Report Status PENDING  Incomplete  CSF culture     Status: None (Preliminary result)   Collection Time: 09/19/14 11:40 AM  Result Value Ref Range Status   Specimen Description CSF  Final   Special Requests NONE  Final   Gram Stain FEW WBC SEEN NO ORGANISMS SEEN   Final   Culture NO GROWTH < 24 HOURS  Final   Report Status PENDING  Incomplete  Culture, fungus without smear     Status: None (Preliminary result)   Collection Time: 09/19/14 11:40 AM  Result Value Ref Range Status   Specimen Description CSF  Final   Special Requests NONE  Final   Culture NO FUNGUS ISOLATED <24 HOURS  Final   Report Status PENDING  Incomplete    RADIOLOGY:  Ct Abdomen Pelvis W Contrast  09/19/2014   CLINICAL DATA:  Diarrhea,  fatigue, urinary tract infection, history of lymphoma per review of the electronic medical record. The patient was pre-medicated according to standard protocol before administration of IV contrast, with no ill effect after today's exam.  EXAM: CT ABDOMEN AND PELVIS WITH CONTRAST  TECHNIQUE: Multidetector CT imaging of the abdomen and pelvis was performed using the standard protocol following bolus administration of intravenous contrast.  CONTRAST:  12mL OMNIPAQUE IOHEXOL 300 MG/ML  SOLN  COMPARISON:  05/17/2014  FINDINGS: Lower chest: Small bilateral effusions with associated compressive atelectasis noted. Imaging degraded by patient body habitus, noise artifact, and motion.  Hepatobiliary: Liver appears normal. Cholecystectomy clips are noted. No intrahepatic or common duct dilatation.  Pancreas: Normal  Spleen: Normal  Adrenals/Urinary Tract: Adrenal glands appear normal. Minimal renal cortical thinning noted without focal lesion or hydroureteronephrosis. No radiopaque renal, ureteral, or bladder calculus.  Stomach/Bowel: Stomach appears normal. Appendix not visualized but no secondary evidence for appendicitis is identified. No bowel wall thickening or focal segmental dilatation is identified.  Vascular/Lymphatic: Moderate atheromatous aortic calcification without aneurysm. Retro aortic left renal vein. No lymphadenopathy.  Other: Uterus and ovaries appear normal. Streak artifact from left dynamic femoral nail.  Musculoskeletal: Multilevel disc degenerative change is noted with evidence of previous vertebral augmentation at L2, with stable compression deformities at L1-L2.  IMPRESSION: No acute intra-abdominal or pelvic pathology.  Small bilateral pleural effusions with associated compressive atelectasis.   Electronically Signed   By: Conchita Paris M.D.   On: 09/19/2014 10:40   Dg Fluoro Guide Lumbar Puncture  09/19/2014   CLINICAL DATA:  Altered mental status  EXAM: DIAGNOSTIC LUMBAR PUNCTURE UNDER  FLUOROSCOPIC GUIDANCE  FLUOROSCOPY TIME:  Radiation Exposure Index (as provided by the fluoroscopic device): 5.5 mGy  PROCEDURE: Informed consent was obtained from the patient's daughter prior to the procedure, including potential complications of headache, allergy, and pain. With the patient prone, the lower back was prepped with Betadine.  1% Lidocaine was used for local anesthesia. Lumbar puncture was performed at the L4-5 level using a 22 gauge needle with return of clear CSF. 9 ml of CSF were obtained for laboratory studies. The patient tolerated the procedure well and there were no apparent complications.  IMPRESSION: Successful fluoroscopic guided diagnostic lumbar puncture.   Electronically Signed   By: Kathreen Devoid   On: 09/19/2014 12:44    EKG:   Orders placed or performed during the hospital encounter of 09/11/14  . ED EKG  . ED EKG  . EKG 12-Lead  . EKG 12-Lead      Management plans discussed with the patient, family and they are in agreement.  CODE STATUS:     Code Status Orders        Start     Ordered   19-Sep-2014 0253  Do not attempt resuscitation (DNR)   Continuous    Question Answer Comment  In the event of cardiac or respiratory ARREST Do not call a "code blue"   In the event of cardiac or respiratory ARREST Do not perform Intubation, CPR, defibrillation or ACLS   In the event of cardiac or respiratory ARREST Use medication by any route, position, wound care, and other measures to relive pain and suffering. May use oxygen, suction and manual treatment of airway obstruction as needed for comfort.   Comments RN may pronounce death      09-19-14 0252    Advance Directive Documentation        Most Recent Value   Type of Advance Directive  Living will   Pre-existing out of facility DNR order (yellow form or pink MOST form)     "MOST" Form in Place?        TOTAL TIME TAKING CARE OF THIS PATIENT: 45 minutes.    _0 @  on 09/20/2014 at 3:01 PM  Between 7am to 6pm  - Pager - [phone]  After 6pm go to [email or web address] - password [email or web address]  Office  [phone]  CC: Primary care physician; Lavera Guise, MD

## 2014-09-20 NOTE — Progress Notes (Signed)
Miles gave report to Myrlene Broker, Therapist, sports.  Called nonemergent transport.

## 2014-09-20 NOTE — Progress Notes (Signed)
Patient has improved today per MD notes. Clinical Education officer, museum (CSW) discussed with patient's daughter Almyra Free rehab option. Daughter is agreeable to rehab and chose [facility]. Patient is medically stable for D/C to [facility] today. Daughter Almyra Free will complete paper work at [facility] after she gets off work at 4 pm. Per [employer] at [employer] patient is going to room 408. RN will call report to 400 hall RN and arrange EMS for transport. Health Team authorization has been received. Auth # [id number]. Clinical Education officer, museum (CSW) prepared D/C packet and sent D/C Summary and follow up appointments to [facility] via [facility]. CSW left a voicemail with patient's daughter Almyra Free making her aware of the D/C. Please reconsult if future social work needs arise. CSW signing off.   Blima Rich, [place] [phone]

## 2014-09-20 NOTE — Progress Notes (Signed)
Neurology:  Pt's mental status has improved.  Still unclear etiology of the mental status despite extensive work up.  Unimpressive LP with no cells, and minimally elevated Glucose. Cytology sent.    I would con't her Seroquel. Keep awake during the day and d/c planning from neuro stand point to SNF. Leotis Pain

## 2014-09-20 NOTE — Clinical Social Work Placement (Signed)
   CLINICAL SOCIAL WORK PLACEMENT  NOTE  Date:  09/20/2014  Patient Details  Name: [name] MRN: [id number] Date of Birth: 1929-11-18  Clinical Social Work is seeking post-discharge placement for this patient at the [place] level of care (*CSW will initial, date and re-position this form in  chart as items are completed):  Yes   Patient/family provided with [facility] Work Department's list of facilities offering this level of care within the geographic area requested by the patient (or if unable, by the patient's family).  Yes   Patient/family informed of their freedom to choose among providers that offer the needed level of care, that participate in Medicare, Medicaid or managed care program needed by the patient, have an available bed and are willing to accept the patient.  Yes   Patient/family informed of [facility]'s ownership interest in [facility] and [facility], as well as of the fact that they are under no obligation to receive care at these facilities.  PASRR submitted to EDS on 09/20/14     PASRR number received on 09/20/14     Existing PASRR number confirmed on       FL2 transmitted to all facilities in geographic area requested by pt/family on 09/14/14     FL2 transmitted to all facilities within larger geographic area on       Patient informed that his/her managed care company has contracts with or will negotiate with certain facilities, including the following:        Yes   Patient/family informed of bed offers received.  Patient chooses bed at  [facility] )     Physician recommends and patient chooses bed at      Patient to be transferred to  [facility] ) on 09/20/14.  Patient to be transferred to facility by  (EMS )     Patient family notified on 09/20/14 of transfer.  Name of family member notified:   (Left daughter Almyra Free a voicemail)     PHYSICIAN       Additional Comment:     _______________________________________________ Loralyn Freshwater, LCSW 09/20/2014, 3:58 PM

## 2014-09-20 NOTE — Consult Note (Addendum)
Palliative Medicine Inpatient Consult Follow Up Note   Name: ONEISHA AMMONS Date: 09/20/2014 MRN: [id number]  DOB: 1929/11/12  Referring Physician: Nicholes Mango, MD  Palliative Care consult requested for this 81 y.o. female for goals of medical therapy in patient with h/o lymphoma, myasthenia gravis, OP, spinal stenosis, admitted with altered mental status  Ms Wh[place]hill is lying in bed. Awake, calm, less confused. Family at bedside.    REVIEW OF SYSTEMS:  Pain: None Dyspnea:  No Nausea/Vomiting:  No Diarrhea:  No Constipation:   No Depression:   No Anxiety:   No Fatigue:   Yes  CODE STATUS: DNR   PAST MEDICAL HISTORY: Past Medical History  Diagnosis Date  . Hypertension   . Thyroid disorder   . Lymphoma   . Hypothyroidism   . Arthritis   . Diverticulosis   . Obstructive sleep apnea   . Myasthenia gravis   . Hip fracture, left   . Fracture of right humerus   . Asthma     PAST SURGICAL HISTORY:  Past Surgical History  Procedure Laterality Date  . Nm pet lymphoma    . Hernia repair    . Cholecystectomy    . Back surgery      Vital Signs: BP 147/102 mmHg  Pulse 54  Temp(Src) 97.4 F (36.3 C) (Oral)  Resp 18  Ht $R'5\' 1"'yR$  (1.549 m)  Wt 90.719 kg (200 lb)  BMI 37.81 kg/m2  SpO2 97% Filed Weights   09/18/14 0300 09/19/14 0500 09/20/14 0500  Weight: 94.25 kg (207 lb 12.5 oz) 91.627 kg (202 lb) 90.719 kg (200 lb)    Estimated body mass index is 37.81 kg/(m^2) as calculated from the following:   Height as of this encounter: $RemoveBeforeD'5\' 1"'dnmALipKxZhwqO$  (1.549 m).   Weight as of this encounter: 90.719 kg (200 lb).  PHYSICAL EXAM: General: NAD HEENT: OP clear, moist oral mucosa Neck: Trachea midline  Cardiovascular: irregular rate and rhythm Pulmonary/Chest: Clear ant fields Abdominal: Soft, NTTP, + bowel sounds GU: No SP tenderness Extremities: + edema  Neurological: moves L.UE, unable to lift R.UE, 3+ motor LE's Skin: no rashes Psychiatric: alert, oriented to person, place,  year LABS: CBC:    Component Value Date/Time   WBC 9.5 09/20/2014 0505   WBC 9.8 05/17/2014 1834   HGB 12.9 09/20/2014 0505   HGB 11.6* 05/17/2014 1834   HCT 39.6 09/20/2014 0505   HCT 36.6 05/17/2014 1834   PLT 264 09/20/2014 0505   PLT 242 05/17/2014 1834   MCV 90.5 09/20/2014 0505   MCV 91 05/17/2014 1834   NEUTROABS 5.2 09/11/2014 2233   NEUTROABS 6.2 05/17/2014 1834   LYMPHSABS 1.6 09/11/2014 2233   LYMPHSABS 2.6 05/17/2014 1834   MONOABS 0.9 09/11/2014 2233   MONOABS 0.8 05/17/2014 1834   EOSABS 0.2 09/11/2014 2233   EOSABS 0.2 05/17/2014 1834   BASOSABS 0.1 09/11/2014 2233   BASOSABS 0.1 05/17/2014 1834   BASOSABS 1 03/22/2014 0530   Comprehensive Metabolic Panel:    Component Value Date/Time   NA 138 09/20/2014 0505   NA 137 05/17/2014 1834   K 3.9 09/20/2014 0505   K 4.4 05/17/2014 1834   CL 100* 09/20/2014 0505   CL 100 05/17/2014 1834   CO2 30 09/20/2014 0505   CO2 30 05/17/2014 1834   BUN 16 09/20/2014 0505   BUN 15 05/17/2014 1834   CREATININE 0.96 09/20/2014 0505   CREATININE 1.13 05/17/2014 1834   GLUCOSE 118* 09/20/2014 0505   GLUCOSE 99 05/17/2014  1834   CALCIUM 8.9 09/20/2014 0505   CALCIUM 8.7 05/17/2014 1834   AST 22 09/11/2014 2233   AST 39* 05/17/2014 1834   ALT 21 09/11/2014 2233   ALT 24 05/17/2014 1834   ALKPHOS 72 09/11/2014 2233   ALKPHOS 82 05/17/2014 1834   BILITOT 0.6 09/11/2014 2233   PROT 6.6 09/11/2014 2233   PROT 6.5 05/17/2014 1834   ALBUMIN 2.9* 09/11/2014 2233   ALBUMIN 2.6* 05/17/2014 1834    IMPRESSION: Ms Taff is an 81 yo woman with PMH of HTN, hyperlipidemia, hypothyroidism, B-cell lymphoma, myasthenia gravis, osteoporosis with h/o fragility fx (L.hip, R.arm), OA with spinal stenosis, OSA, diverticulosis, s/p cholecystectomy and hernia repair. She was admitted 09/11/14 with 1-2 weeks of intermittent altered mental status. Pt has had extensive work-up including MRI, EEG, CT chest/abd/pelvis, LP. All tests thus far  negative for etiology of delirium.   Pt's mental status today is improved from my previous exams. She is able to tell me the year, where she lives ([place]), etc. Family at bedside agree that she is better. Pt worked with PT yesterday with recommendation for STR at SNF. Pt reportedly ate 75% of meal last PM.   I spoke with daughter, Almyra Free, by phone. Updated her on pt's condition. Daughter will be here later today and I will meet with her at that time. Daughter trying to decide on SNF for STR vs returning to ALF.   PLAN: Family meeting  REFERRALS TO BE ORDERED:  Social work   More than 50% of the visit was spent in counseling/coordination of care: YES  Time spent: 35 minutes   I met with pt's daughter, Almyra Free. She agrees that pt is much improved. Discussed the options of ALF vs STR at d/c. Family would like pt to go to SNF for STR. CM & CSW aware.

## 2014-09-20 NOTE — Progress Notes (Signed)
Patient more alert and answers questions appropriately this shift.  Uses call bell for assistance, not impulsive.  Plan to discharge today to [facility] for rehab.  Monitoring patient food intake to ensure that patient getting nutritious food.

## 2014-09-21 LAB — URINE CULTURE: CULTURE: NO GROWTH

## 2014-09-21 LAB — OLIGOCLONAL BANDS, CSF + SERM

## 2014-09-21 LAB — VDRL, CSF: VDRL Quant, CSF: NONREACTIVE

## 2014-09-23 LAB — CSF CULTURE W GRAM STAIN

## 2014-09-23 LAB — CSF CULTURE: CULTURE: NO GROWTH

## 2014-09-25 LAB — MISC LABCORP TEST (SEND OUT): LABCORP TEST CODE: 6551

## 2014-09-25 LAB — CYTOLOGY - NON PAP

## 2014-10-11 LAB — CULTURE, FUNGUS WITHOUT SMEAR: Culture: NO GROWTH

## 2014-10-22 ENCOUNTER — Other Ambulatory Visit: Payer: Self-pay

## 2014-10-25 ENCOUNTER — Ambulatory Visit: Payer: PPO | Admitting: Oncology

## 2014-11-13 ENCOUNTER — Inpatient Hospital Stay: Payer: PPO | Admitting: Oncology

## 2014-11-27 ENCOUNTER — Ambulatory Visit: Payer: Self-pay | Admitting: Podiatry

## 2014-12-07 NOTE — Patient Outreach (Signed)
[facility] [facility]) Care Management  12/07/2014  [name] October 27, 1929 [id number]   Referral from HTA tier 4 list, assigned to Sherrin Daisy, [facility] for patient outreach.  Rebekha Diveley L. Braydyn Schultes, [place] Care Management Assistant

## 2014-12-28 ENCOUNTER — Other Ambulatory Visit: Payer: Self-pay | Admitting: *Deleted

## 2014-12-28 NOTE — Patient Outreach (Signed)
[place] [facility]) Care Management  12/28/2014  NAVEENA EYMAN 1929/11/06 [id number]   High Risk referral:  Telephone call to patient; left HIPPA secure voice mail requesting return call.  Plan: will follow up. Sherrin Daisy, RN BSN [place] Management Coordinator [facility] Care Management  [phone]

## 2015-01-02 ENCOUNTER — Other Ambulatory Visit: Payer: Self-pay | Admitting: *Deleted

## 2015-01-14 ENCOUNTER — Encounter: Payer: Self-pay | Admitting: *Deleted

## 2015-01-14 ENCOUNTER — Other Ambulatory Visit: Payer: Self-pay | Admitting: *Deleted

## 2015-01-14 NOTE — Patient Outreach (Signed)
[facility] [place] [facility]) Care Management  01/14/2015  CASSUNDRA MCKEEVER 1930-04-02 [id number]   Referral from [employer] tier 4 list:   Return call to daughter of patient who left message on voice mail.  Telephone call to daughter(Julie) who advises that patient is unable to take call because she is very hard of hearing and defers calls to her. Daughter verified patient date of birth & address.  States patient is currently living in [place] facility where all of personal & every day needs are being met.  Biomedical engineer and administers patient's medications. States she receives primary care from [employer] (Dr. Perrin Smack) who come from [place] area.  States she no longer sees previous primary care provider since move to assistant living in March 2016. States her hypertension and thyroid conditions are under control and being managed by new primary care provider.   Plan: will close out case; send MD closure letter.  Sherrin Daisy, RN BSN [place] Management Coordinator [facility] Care Management  [phone]

## 2015-01-16 NOTE — Patient Outreach (Signed)
[place] [facility]) Care Management  01/16/2015  [name] 11/07/1929 [id number]   Notification received from Sherrin Daisy, [facility] to close case due to consumer being assessed and no further intervention required.  Damita L. Rhodie, [place] Care Management Assistant

## 2015-02-21 NOTE — Telephone Encounter (Signed)
Error

## 2015-07-08 ENCOUNTER — Encounter: Payer: Self-pay | Admitting: *Deleted

## 2015-07-08 ENCOUNTER — Other Ambulatory Visit: Payer: Self-pay | Admitting: *Deleted

## 2015-07-08 NOTE — Patient Outreach (Addendum)
[facility]) Care Management  07/08/2015  [name] 29-Oct-1929 [id number]  Subjective:  Telephone call to patient's home number, spoke with patient's daughter [name], states patient is currently living at [place] [facility] and is unavailable.   States patient is very hard of hearing, sometimes will not answer the phone, and can  be reached at the following phone  number  [phone].   Left HIPAA compliant message with patient's daughter and RNCM advised that could not give nature of call without patient's permission.  Telephone call to patient's room phone number [phone]) at [facility], no answer, and unable to leave a message.  Telephone call to patient's room phone number [phone]) at [facility], spoke with patient and HIPAA verified ( date of birth, name, and daughter's phone number).   States she does not know the address of the Assisted Living facility and it is located in [place].   Discussed [facility] Care Management services and patient declined services at this time.   States she is well taken care of at the facility, does not have any educational or care management needs at this time.    Patient gave verbal authorization to speak with daughter [name] regarding her healthcare needs as needed.   Patient states she is very appreciative of the call and declined services at this time.   Telephone patient's home number [phone]) (daughter's home number), spoke with patient's daughter [name], per patient's request, and HIPAA verified.   Patient's daughter, able to states patient's mailing address listed as home address in Epic which is daughter's home address.    Discussed [facility] Care Management services and RNCM advised daughter of conversation with patient and patient's refusal of services at this time.  Patient's daughter states patient's primary MD is Dr. Perrin Rodriguez at [facility], [address], [place], [place] [address], [phone].  RNCM advised patient's daughter that a case closure letter will be send to primary MD regarding [place] Management services.   Patient's daughter voices understanding and has RNCM's contact number if patient has additional needs in the future, and will contact [facility].       Objective:  Per Epic chart review: patient's daughter has declined [place] Management services on patient's behalf on 01/14/15.    Assessment: Received HTA Tier 4 list referral on 06/24/15.    0 Admissions and 4 ER visits.   No diagnosis listed.   Patient refused care management services at this time.  Plan:   RNCM will send case closure letter due to refusal of services/ no care management needs to patient's primary MD. [facility] will send case closure due to refusal of services/ no care management needs request to [place] at [place] Management.   RNCM will send request to update the following contact information and primary MD to [place] [facility] [place] Management to update in patient's Epic chart.   Primary MD is Dr. Perrin Rodriguez at [facility], [address], [place], [place] [address], [phone].  Patient direct number at [facility] [place] facility [phone]).   [name], BSN, [place] Management [facility] Telephonic CM Phone: [phone] Fax: [phone]

## 2015-07-28 ENCOUNTER — Inpatient Hospital Stay: Payer: PPO

## 2015-07-28 ENCOUNTER — Inpatient Hospital Stay
Admission: EM | Admit: 2015-07-28 | Discharge: 2015-08-07 | DRG: 270 | Disposition: A | Discharge status: Hospice | Payer: PPO | Attending: Vascular Surgery | Admitting: Vascular Surgery

## 2015-07-28 ENCOUNTER — Encounter: Payer: Self-pay | Admitting: Emergency Medicine

## 2015-07-28 ENCOUNTER — Emergency Department: Payer: PPO

## 2015-07-28 DIAGNOSIS — J9600 Acute respiratory failure, unspecified whether with hypoxia or hypercapnia: Secondary | ICD-10-CM | POA: Diagnosis not present

## 2015-07-28 DIAGNOSIS — G4733 Obstructive sleep apnea (adult) (pediatric): Secondary | ICD-10-CM | POA: Diagnosis present

## 2015-07-28 DIAGNOSIS — N189 Chronic kidney disease, unspecified: Secondary | ICD-10-CM | POA: Diagnosis present

## 2015-07-28 DIAGNOSIS — Z79899 Other long term (current) drug therapy: Secondary | ICD-10-CM

## 2015-07-28 DIAGNOSIS — E876 Hypokalemia: Secondary | ICD-10-CM | POA: Diagnosis not present

## 2015-07-28 DIAGNOSIS — J45909 Unspecified asthma, uncomplicated: Secondary | ICD-10-CM | POA: Diagnosis present

## 2015-07-28 DIAGNOSIS — Z86718 Personal history of other venous thrombosis and embolism: Secondary | ICD-10-CM

## 2015-07-28 DIAGNOSIS — I745 Embolism and thrombosis of iliac artery: Principal | ICD-10-CM | POA: Diagnosis present

## 2015-07-28 DIAGNOSIS — D62 Acute posthemorrhagic anemia: Secondary | ICD-10-CM | POA: Diagnosis not present

## 2015-07-28 DIAGNOSIS — I739 Peripheral vascular disease, unspecified: Secondary | ICD-10-CM

## 2015-07-28 DIAGNOSIS — Z22322 Carrier or suspected carrier of Methicillin resistant Staphylococcus aureus: Secondary | ICD-10-CM | POA: Diagnosis not present

## 2015-07-28 DIAGNOSIS — I255 Ischemic cardiomyopathy: Secondary | ICD-10-CM | POA: Diagnosis present

## 2015-07-28 DIAGNOSIS — J9602 Acute respiratory failure with hypercapnia: Secondary | ICD-10-CM

## 2015-07-28 DIAGNOSIS — I998 Other disorder of circulatory system: Secondary | ICD-10-CM | POA: Diagnosis present

## 2015-07-28 DIAGNOSIS — Z7982 Long term (current) use of aspirin: Secondary | ICD-10-CM | POA: Diagnosis not present

## 2015-07-28 DIAGNOSIS — J9811 Atelectasis: Secondary | ICD-10-CM | POA: Diagnosis not present

## 2015-07-28 DIAGNOSIS — R32 Unspecified urinary incontinence: Secondary | ICD-10-CM | POA: Diagnosis present

## 2015-07-28 DIAGNOSIS — E872 Acidosis: Secondary | ICD-10-CM

## 2015-07-28 DIAGNOSIS — I5023 Acute on chronic systolic (congestive) heart failure: Secondary | ICD-10-CM | POA: Diagnosis not present

## 2015-07-28 DIAGNOSIS — R319 Hematuria, unspecified: Secondary | ICD-10-CM | POA: Diagnosis not present

## 2015-07-28 DIAGNOSIS — I248 Other forms of acute ischemic heart disease: Secondary | ICD-10-CM | POA: Diagnosis present

## 2015-07-28 DIAGNOSIS — M79605 Pain in left leg: Secondary | ICD-10-CM

## 2015-07-28 DIAGNOSIS — I7409 Other arterial embolism and thrombosis of abdominal aorta: Secondary | ICD-10-CM | POA: Diagnosis present

## 2015-07-28 DIAGNOSIS — M1990 Unspecified osteoarthritis, unspecified site: Secondary | ICD-10-CM | POA: Diagnosis present

## 2015-07-28 DIAGNOSIS — I214 Non-ST elevation (NSTEMI) myocardial infarction: Secondary | ICD-10-CM | POA: Diagnosis not present

## 2015-07-28 DIAGNOSIS — Z8572 Personal history of non-Hodgkin lymphomas: Secondary | ICD-10-CM

## 2015-07-28 DIAGNOSIS — J449 Chronic obstructive pulmonary disease, unspecified: Secondary | ICD-10-CM | POA: Diagnosis present

## 2015-07-28 DIAGNOSIS — I480 Paroxysmal atrial fibrillation: Secondary | ICD-10-CM | POA: Diagnosis present

## 2015-07-28 DIAGNOSIS — I959 Hypotension, unspecified: Secondary | ICD-10-CM | POA: Diagnosis not present

## 2015-07-28 DIAGNOSIS — G7000 Myasthenia gravis without (acute) exacerbation: Secondary | ICD-10-CM | POA: Diagnosis present

## 2015-07-28 DIAGNOSIS — Z515 Encounter for palliative care: Secondary | ICD-10-CM | POA: Diagnosis not present

## 2015-07-28 DIAGNOSIS — I743 Embolism and thrombosis of arteries of the lower extremities: Secondary | ICD-10-CM | POA: Diagnosis not present

## 2015-07-28 DIAGNOSIS — R571 Hypovolemic shock: Secondary | ICD-10-CM | POA: Diagnosis not present

## 2015-07-28 DIAGNOSIS — I10 Essential (primary) hypertension: Secondary | ICD-10-CM | POA: Diagnosis present

## 2015-07-28 DIAGNOSIS — E861 Hypovolemia: Secondary | ICD-10-CM | POA: Diagnosis not present

## 2015-07-28 DIAGNOSIS — Z66 Do not resuscitate: Secondary | ICD-10-CM | POA: Diagnosis present

## 2015-07-28 DIAGNOSIS — E039 Hypothyroidism, unspecified: Secondary | ICD-10-CM | POA: Diagnosis present

## 2015-07-28 DIAGNOSIS — I130 Hypertensive heart and chronic kidney disease with heart failure and stage 1 through stage 4 chronic kidney disease, or unspecified chronic kidney disease: Secondary | ICD-10-CM | POA: Diagnosis present

## 2015-07-28 DIAGNOSIS — Z7901 Long term (current) use of anticoagulants: Secondary | ICD-10-CM | POA: Diagnosis not present

## 2015-07-28 DIAGNOSIS — R0602 Shortness of breath: Secondary | ICD-10-CM

## 2015-07-28 DIAGNOSIS — R4182 Altered mental status, unspecified: Secondary | ICD-10-CM

## 2015-07-28 DIAGNOSIS — M79604 Pain in right leg: Secondary | ICD-10-CM

## 2015-07-28 HISTORY — DX: Other disorder of circulatory system: I99.8

## 2015-07-28 HISTORY — DX: Paroxysmal atrial fibrillation: I48.0

## 2015-07-28 HISTORY — DX: Chronic systolic (congestive) heart failure: I50.22

## 2015-07-28 LAB — URINALYSIS COMPLETE WITH MICROSCOPIC (ARMC ONLY)
BILIRUBIN URINE: NEGATIVE
Bacteria, UA: NONE SEEN
GLUCOSE, UA: NEGATIVE mg/dL
Hgb urine dipstick: NEGATIVE
KETONES UR: NEGATIVE mg/dL
Leukocytes, UA: NEGATIVE
NITRITE: NEGATIVE
PH: 5 (ref 5.0–8.0)
Protein, ur: NEGATIVE mg/dL
Specific Gravity, Urine: 1.019 (ref 1.005–1.030)

## 2015-07-28 LAB — CBC
HCT: 40.1 % (ref 35.0–47.0)
HEMOGLOBIN: 13.4 g/dL (ref 12.0–16.0)
MCH: 30.3 pg (ref 26.0–34.0)
MCHC: 33.3 g/dL (ref 32.0–36.0)
MCV: 90.9 fL (ref 80.0–100.0)
PLATELETS: 181 10*3/uL (ref 150–440)
RBC: 4.42 MIL/uL (ref 3.80–5.20)
RDW: 14.3 % (ref 11.5–14.5)
WBC: 11.2 10*3/uL — ABNORMAL HIGH (ref 3.6–11.0)

## 2015-07-28 LAB — BASIC METABOLIC PANEL
ANION GAP: 7 (ref 5–15)
BUN: 28 mg/dL — ABNORMAL HIGH (ref 6–20)
CALCIUM: 8.9 mg/dL (ref 8.9–10.3)
CO2: 27 mmol/L (ref 22–32)
CREATININE: 1.4 mg/dL — AB (ref 0.44–1.00)
Chloride: 98 mmol/L — ABNORMAL LOW (ref 101–111)
GFR, EST AFRICAN AMERICAN: 39 mL/min — AB (ref >60)
GFR, EST NON AFRICAN AMERICAN: 33 mL/min — AB (ref >60)
GLUCOSE: 97 mg/dL (ref 65–99)
Potassium: 4 mmol/L (ref 3.5–5.1)
Sodium: 132 mmol/L — ABNORMAL LOW (ref 135–145)

## 2015-07-28 LAB — MRSA PCR SCREENING: MRSA by PCR: POSITIVE — AB

## 2015-07-28 LAB — APTT: APTT: 37 s — AB (ref 24–36)

## 2015-07-28 LAB — PROTIME-INR
INR: 1.16
Prothrombin Time: 15 seconds (ref 11.4–15.0)

## 2015-07-28 LAB — CK: CK TOTAL: 32 U/L — AB (ref 38–234)

## 2015-07-28 MED ORDER — QUETIAPINE FUMARATE 25 MG PO TABS
25.0000 mg | ORAL_TABLET | Freq: Three times a day (TID) | ORAL | Status: DC
  Administered 2015-07-28: 25 mg via ORAL
  Filled 2015-07-28 (×2): qty 1

## 2015-07-28 MED ORDER — ASPIRIN EC 81 MG PO TBEC
81.0000 mg | DELAYED_RELEASE_TABLET | Freq: Every day | ORAL | Status: DC
  Administered 2015-07-28: 81 mg via ORAL
  Filled 2015-07-28 (×2): qty 1

## 2015-07-28 MED ORDER — HYDROCODONE-ACETAMINOPHEN 5-325 MG PO TABS
1.0000 | ORAL_TABLET | ORAL | Status: DC | PRN
  Administered 2015-08-03 – 2015-08-04 (×3): 2 via ORAL
  Filled 2015-07-28 (×3): qty 2

## 2015-07-28 MED ORDER — DOCUSATE SODIUM 100 MG PO CAPS
100.0000 mg | ORAL_CAPSULE | Freq: Two times a day (BID) | ORAL | Status: DC
  Administered 2015-07-28: 100 mg via ORAL
  Filled 2015-07-28 (×2): qty 1

## 2015-07-28 MED ORDER — ACETAMINOPHEN 325 MG PO TABS: 650.0000 mg | ORAL_TABLET | Freq: Four times a day (QID) | ORAL | Status: DC | PRN

## 2015-07-28 MED ORDER — ONDANSETRON HCL 4 MG PO TABS: 4.0000 mg | ORAL_TABLET | Freq: Four times a day (QID) | ORAL | Status: DC | PRN

## 2015-07-28 MED ORDER — OXYCODONE HCL 5 MG PO TABS: 5.0000 mg | ORAL_TABLET | Freq: Two times a day (BID) | ORAL | Status: DC | PRN

## 2015-07-28 MED ORDER — ALBUTEROL SULFATE (2.5 MG/3ML) 0.083% IN NEBU
3.0000 mL | INHALATION_SOLUTION | Freq: Four times a day (QID) | RESPIRATORY_TRACT | Status: DC | PRN
  Administered 2015-08-03: 3 mL via RESPIRATORY_TRACT
  Filled 2015-07-28: qty 3

## 2015-07-28 MED ORDER — MORPHINE SULFATE (PF) 2 MG/ML IV SOLN: 2.0000 mg | INTRAVENOUS | Status: DC | PRN

## 2015-07-28 MED ORDER — SORBITOL 70 % SOLN
30.0000 mL | Freq: Every day | Status: DC | PRN
Start: 2015-07-28 — End: 2015-08-07
  Administered 2015-07-31: 30 mL via ORAL
  Filled 2015-07-28 (×3): qty 30

## 2015-07-28 MED ORDER — ACETAMINOPHEN 325 MG PO TABS: 650.0000 mg | ORAL_TABLET | ORAL | Status: DC | PRN

## 2015-07-28 MED ORDER — HEPARIN (PORCINE) IN NACL 100-0.45 UNIT/ML-% IJ SOLN
1450.0000 [IU]/h | INTRAMUSCULAR | Status: DC
  Administered 2015-07-28 – 2015-07-30 (×2): 1450 [IU]/h via INTRAVENOUS
  Filled 2015-07-28 (×2): qty 250

## 2015-07-28 MED ORDER — CALCIUM CARBONATE-VITAMIN D 500-200 MG-UNIT PO TABS
1.0000 | ORAL_TABLET | Freq: Two times a day (BID) | ORAL | Status: DC
  Administered 2015-07-28: 1 via ORAL
  Filled 2015-07-28 (×2): qty 1

## 2015-07-28 MED ORDER — FLEET ENEMA 7-19 GM/118ML RE ENEM: 1.0000 | ENEMA | Freq: Once | RECTAL | Status: DC | PRN

## 2015-07-28 MED ORDER — HYDROCHLOROTHIAZIDE 12.5 MG PO CAPS
12.5000 mg | ORAL_CAPSULE | Freq: Every day | ORAL | Status: DC
  Filled 2015-07-28: qty 1

## 2015-07-28 MED ORDER — ATENOLOL 50 MG PO TABS
25.0000 mg | ORAL_TABLET | Freq: Every day | ORAL | Status: DC
  Administered 2015-07-28: 25 mg via ORAL
  Filled 2015-07-28 (×2): qty 1

## 2015-07-28 MED ORDER — LEVOTHYROXINE SODIUM 75 MCG PO TABS: 75.0000 ug | ORAL_TABLET | Freq: Every day | ORAL | Status: DC

## 2015-07-28 MED ORDER — ASPIRIN EC 81 MG PO TBEC: 81.0000 mg | DELAYED_RELEASE_TABLET | Freq: Every day | ORAL | Status: DC

## 2015-07-28 MED ORDER — ACETAMINOPHEN 650 MG RE SUPP: 650.0000 mg | Freq: Four times a day (QID) | RECTAL | Status: DC | PRN

## 2015-07-28 MED ORDER — BENAZEPRIL HCL 20 MG PO TABS
20.0000 mg | ORAL_TABLET | Freq: Every day | ORAL | Status: DC
  Filled 2015-07-28: qty 1

## 2015-07-28 MED ORDER — FERROUS SULFATE 325 (65 FE) MG PO TABS: 325.0000 mg | ORAL_TABLET | Freq: Two times a day (BID) | ORAL | Status: DC

## 2015-07-28 MED ORDER — MAGNESIUM HYDROXIDE 400 MG/5ML PO SUSP: 30.0000 mL | Freq: Every day | ORAL | Status: DC | PRN

## 2015-07-28 MED ORDER — POLYETHYLENE GLYCOL 3350 17 G PO PACK: 17.0000 g | PACK | Freq: Every day | ORAL | Status: DC

## 2015-07-28 MED ORDER — MENTHOL 3 MG MT LOZG
1.0000 | LOZENGE | OROMUCOSAL | Status: DC | PRN
  Filled 2015-07-28: qty 9

## 2015-07-28 MED ORDER — PREDNISONE 50 MG PO TABS
50.0000 mg | ORAL_TABLET | Freq: Once | ORAL | Status: AC
  Administered 2015-07-29: 50 mg via ORAL
  Filled 2015-07-28: qty 1

## 2015-07-28 MED ORDER — SODIUM CHLORIDE 0.9 % IV SOLN
INTRAVENOUS | Status: DC
  Administered 2015-07-28 – 2015-08-03 (×3): via INTRAVENOUS

## 2015-07-28 MED ORDER — SODIUM CHLORIDE 0.9% FLUSH
3.0000 mL | Freq: Two times a day (BID) | INTRAVENOUS | Status: DC
  Administered 2015-07-28 – 2015-08-07 (×14): 3 mL via INTRAVENOUS

## 2015-07-28 MED ORDER — BENAZEPRIL-HYDROCHLOROTHIAZIDE 20-12.5 MG PO TABS: 1.0000 | ORAL_TABLET | Freq: Every day | ORAL | Status: DC

## 2015-07-28 MED ORDER — FESOTERODINE FUMARATE ER 4 MG PO TB24
4.0000 mg | ORAL_TABLET | Freq: Every day | ORAL | Status: DC
  Filled 2015-07-28: qty 1

## 2015-07-28 MED ORDER — DOCUSATE SODIUM 100 MG PO CAPS: 100.0000 mg | ORAL_CAPSULE | Freq: Two times a day (BID) | ORAL | Status: DC

## 2015-07-28 MED ORDER — POLYVINYL ALCOHOL 1.4 % OP SOLN
1.0000 [drp] | OPHTHALMIC | Status: DC | PRN
  Filled 2015-07-28: qty 15

## 2015-07-28 MED ORDER — HEPARIN BOLUS VIA INFUSION
4500.0000 [IU] | Freq: Once | INTRAVENOUS | Status: AC
  Administered 2015-07-28: 4500 [IU] via INTRAVENOUS
  Filled 2015-07-28: qty 4500

## 2015-07-28 MED ORDER — PREDNISONE 50 MG PO TABS
50.0000 mg | ORAL_TABLET | ORAL | Status: AC
  Administered 2015-07-28: 50 mg via ORAL
  Filled 2015-07-28: qty 1

## 2015-07-28 MED ORDER — ONDANSETRON HCL 4 MG/2ML IJ SOLN: 4.0000 mg | Freq: Four times a day (QID) | INTRAMUSCULAR | Status: DC | PRN

## 2015-07-28 NOTE — ED Provider Notes (Signed)
[facility] Emergency Department Provider Note  ____________________________________________  Time seen: Approximately 2:33 PM  I have reviewed the triage vital signs and the nursing notes.   HISTORY  Chief Complaint Urinary Tract Infection; Weakness; and Hip Pain   HPI [name] is a 82 y.o. female since for evaluation of 2-3 days of pain in the left leg. She reports she's had pain starts about the area of the left hip and goes all the way down into her foot. It is notably worse whenever she tries to stand. She has a history of previous history, but also does have a history of a previous blood clot for which she was anticoagulated while she was treated for lymphoma.  The patient and her family including her daughter reports she's been slightly more confused which made them think she could've urinary tract infection but she has not had any fevers, headache, chest pain, shortness of breath nausea vomiting.  No fall.   Past Medical History  Diagnosis Date  . Hypertension   . Thyroid disorder   . Lymphoma ([place])   . Hypothyroidism   . Arthritis   . Diverticulosis   . Obstructive sleep apnea   . Myasthenia gravis ([place])   . Hip fracture, left ([place])   . Fracture of right humerus   . Asthma     Patient Active Problem List   Diagnosis Date Noted  . Ischemic leg 07/28/2015  . Acute delirium 09/14/2014  . Acute encephalopathy 09/12/2014  . HTN (hypertension) 09/12/2014  . Hypothyroidism 09/12/2014  . A-fib ([place]) 09/12/2014  . Encephalopathy acute 09/12/2014  . Diplopia 12/07/2012    Past Surgical History  Procedure Laterality Date  . Nm pet lymphoma    . Hernia repair    . Cholecystectomy    . Back surgery      Current Outpatient Rx  Name  Route  Sig  Dispense  Refill  . acetaminophen (TYLENOL) 325 MG tablet   Oral   Take 650 mg by mouth every 4 (four) hours as needed for mild pain.         Marland Kitchen albuterol (PROVENTIL HFA;VENTOLIN HFA)  108 (90 BASE) MCG/ACT inhaler   Inhalation   Inhale 2 puffs into the lungs every 6 (six) hours as needed for shortness of breath.         Marland Kitchen aspirin EC 81 MG tablet   Oral   Take 81 mg by mouth daily.         Marland Kitchen atenolol (TENORMIN) 25 MG tablet   Oral   Take 25 mg by mouth daily.          . benazepril-hydrochlorthiazide (LOTENSIN HCT) 20-12.5 MG per tablet   Oral   Take 1 tablet by mouth daily.          . calcium-vitamin D (OSCAL WITH D) 500-200 MG-UNIT per tablet   Oral   Take 1 tablet by mouth 2 (two) times daily.         . carboxymethylcellulose (REFRESH PLUS) 0.5 % SOLN   Both Eyes   Place 1 drop into both eyes as needed.         . clotrimazole-betamethasone (LOTRISONE) cream      as needed.          . docusate sodium (COLACE) 100 MG capsule   Oral   Take 100 mg by mouth 2 (two) times daily.         . feeding supplement, ENSURE ENLIVE, (ENSURE ENLIVE)  LIQD   Oral   Take 237 mLs by mouth 2 (two) times daily between meals.   237 mL   12   . ferrous sulfate 325 (65 FE) MG tablet   Oral   Take 325 mg by mouth 2 (two) times daily with a meal.         . levothyroxine (SYNTHROID, LEVOTHROID) 75 MCG tablet   Oral   Take 75 mcg by mouth daily.          Marland Kitchen lidocaine (XYLOCAINE) 5 % ointment   Topical   Apply 1 application topically every 4 (four) hours as needed (rectal pain).         Marland Kitchen menthol-cetylpyridinium (CEPACOL) 3 MG lozenge   Oral   Take 1 lozenge by mouth as needed for sore throat.         . Multiple Vitamins-Minerals (MULTIVITAMIN WITH MINERALS) tablet   Oral   Take 1 tablet by mouth daily.         Marland Kitchen oxyCODONE (OXY IR/ROXICODONE) 5 MG immediate release tablet   Oral   Take 1 tablet (5 mg total) by mouth 2 (two) times daily as needed for moderate pain or severe pain.   15 tablet   0   . polyethylene glycol (MIRALAX / GLYCOLAX) packet   Oral   Take 17 g by mouth daily.         . QUEtiapine (SEROQUEL) 25 MG tablet    Oral   Take 1 tablet (25 mg total) by mouth 3 (three) times daily.   30 tablet   0   . tolterodine (DETROL LA) 4 MG 24 hr capsule   Oral   Take 4 mg by mouth daily.           Allergies Betadine; Sulfa antibiotics; and Valium  Family History  Problem Relation Age of Onset  . Heart Problems Mother   . Cancer Father     Social History Social History  Substance Use Topics  . Smoking status: Never Smoker   . Smokeless tobacco: Never Used  . Alcohol Use: No    Review of Systems Constitutional: No fever/chills Eyes: No visual changes. ENT: No sore throat. Cardiovascular: Denies chest pain. Respiratory: Denies shortness of breath. Gastrointestinal: No abdominal pain.  No nausea, no vomiting.  No diarrhea.  No constipation. Genitourinary: Negative for dysuria. Musculoskeletal: Negative for back pain but does report a shooting pain that runs the entire length of her left leg. Skin: Negative for rash. Neurological: Negative for headaches, focal weakness or numbness.  Family reports that  10-point ROS otherwise negative.  ____________________________________________   PHYSICAL EXAM:  VITAL SIGNS: ED Triage Vitals  Enc Vitals Group     BP 07/28/15 1316 100/49 mmHg     Pulse Rate 07/28/15 1316 73     Resp 07/28/15 1316 20     Temp 07/28/15 1316 98.7 F (37.1 C)     Temp Source 07/28/15 1316 Rectal     SpO2 07/28/15 1316 96 %     Weight 07/28/15 1316 200 lb (90.719 kg)     Height --      Head Cir --      Peak Flow --      Pain Score 07/28/15 1238 7     Pain Loc --      Pain Edu? --      Excl. in [address]? --    Constitutional: Alert and oriented to daughter and place, but not to date. Well appearing  and in no acute distress. Eyes: Conjunctivae are normal. PERRL. EOMI. Head: Atraumatic. Nose: No congestion/rhinnorhea. Mouth/Throat: Mucous membranes are moist.  Oropharynx non-erythematous. Neck: No stridor.   Cardiovascular: Normal rate, regular rhythm. Grossly  normal heart sounds.  Good peripheral circulation. Respiratory: Normal respiratory effort.  No retractions. Lungs CTAB. Gastrointestinal: Soft and nontender. No distention. Musculoskeletal: No lower extremity tenderness nor edema.  No joint effusions. Full range of motion of the right lower leg without pain. Attempting to move the left foot causes the patient pain. She does not have any obvious bony tenderness to the foot, lower leg. No pain with axial loading. No tenderness over the left hip. Minimal tenderness over the lumbar spine, but seems indicates she has chronic back pain. No skin lesions noted. Right foot dopplerable dorsalis pedis and posterior tibial with good capillary refill. Left foot no dopplerable signals, mild cyanosis and slow capillary refill in the left toes. Daughter reports that the left foot appeared blue and toenails looked "black" earlier today with color seems to be better than it was. Neurologic:  Normal speech and language. No gross focal neurologic deficits are appreciated. Skin:  Skin is warm, dry and intact. No rash noted. Psychiatric: Mood and affect are normal. Speech and behavior are normal.  ____________________________________________   LABS (all labs ordered are listed, but only abnormal results are displayed)  Labs Reviewed  BASIC METABOLIC PANEL - Abnormal; Notable for the following:    Sodium 132 (*)    Chloride 98 (*)    BUN 28 (*)    Creatinine, Ser 1.40 (*)    GFR calc non Af Amer 33 (*)    GFR calc Af Amer 39 (*)    All other components within normal limits  CBC - Abnormal; Notable for the following:    WBC 11.2 (*)    All other components within normal limits  URINALYSIS COMPLETEWITH MICROSCOPIC ([facility] ONLY) - Abnormal; Notable for the following:    Color, Urine AMBER (*)    APPearance CLOUDY (*)    Squamous Epithelial / LPF 0-5 (*)    All other components within normal limits  APTT - Abnormal; Notable for the following:    aPTT 37 (*)     All other components within normal limits  CK - Abnormal; Notable for the following:    Total CK 32 (*)    All other components within normal limits  PROTIME-INR  CBC  BASIC METABOLIC PANEL  APTT  PROTIME-INR  URINALYSIS COMPLETEWITH MICROSCOPIC ([facility] ONLY)   ____________________________________________  EKG Reviewed and interpreted by me Normal sinus rhythm, ventricular rate 75 P waves are noted occasionally, though low amplitude. There are occasional PACs.  Does not appear to be in acute A. fib at this time. QTc 470 QRS 70 PR 145  No evidence of ischemic abnormality noted. ____________________________________________  RADIOLOGY  DG Lumbar Spine 2-3 Views (Final result) Result time: 07/28/15 16:25:05   Final result by Rad Results In Interface (07/28/15 16:25:05)   Narrative:   CLINICAL DATA: Low back and left hip pain. No time course given.  EXAM: LUMBAR SPINE - 2-3 VIEW; DG HIP (WITH OR WITHOUT PELVIS) 2-3V LEFT  COMPARISON: CT scan 09/19/2014  FINDINGS: Lumbar spine:  Left convex lumbar scoliosis and degenerative lumbar spondylosis. Stable vertebral augmentation changes at L2. There is also a remote compression fracture of L1. No acute bony findings. Stable advanced facet disease. No obvious pars defects. Stable aortic and iliac artery calcifications. The visualized bony pelvis is intact.  Pelvis/left hip:  Both hips are normally located. Moderate degenerative changes bilaterally. The pubic symphysis and SI joints are intact. No pelvic fractures.  Remote intra medullary rod and proximal dynamic hip screw transfixing a remote intertrochanteric fracture. No acute bony findings. The hardware is intact.  IMPRESSION: 1. Remote lumbar compression fractures. No acute abnormality. Stable scoliosis and degenerative lumbar spondylosis. 2. Intact left hip hardware. No acute hip or pelvic fracture.   Electronically Signed By: Marijo Sanes M.D. On:  07/28/2015 16:25          DG HIP UNILAT WITH PELVIS 2-3 VIEWS LEFT (Final result) Result time: 07/28/15 16:25:05   Final result by Rad Results In Interface (07/28/15 16:25:05)   Narrative:   CLINICAL DATA: Low back and left hip pain. No time course given.  EXAM: LUMBAR SPINE - 2-3 VIEW; DG HIP (WITH OR WITHOUT PELVIS) 2-3V LEFT  COMPARISON: CT scan 09/19/2014  FINDINGS: Lumbar spine:  Left convex lumbar scoliosis and degenerative lumbar spondylosis. Stable vertebral augmentation changes at L2. There is also a remote compression fracture of L1. No acute bony findings. Stable advanced facet disease. No obvious pars defects. Stable aortic and iliac artery calcifications. The visualized bony pelvis is intact.  Pelvis/left hip:  Both hips are normally located. Moderate degenerative changes bilaterally. The pubic symphysis and SI joints are intact. No pelvic fractures.  Remote intra medullary rod and proximal dynamic hip screw transfixing a remote intertrochanteric fracture. No acute bony findings. The hardware is intact.  IMPRESSION: 1. Remote lumbar compression fractures. No acute abnormality. Stable scoliosis and degenerative lumbar spondylosis. 2. Intact left hip hardware. No acute hip or pelvic fracture.   Electronically Signed By: Marijo Sanes M.D. On: 07/28/2015 16:25    ____________________________________________   PROCEDURES  Procedure(s) performed: None  Critical Care performed: Yes, see critical care note(s)  CRITICAL CARE Performed by: Delman Kitten   Total critical care time: 35 minutes  Critical care time was exclusive of separately billable procedures and treating other patients.  Critical care was necessary to treat or prevent imminent or life-threatening deterioration.  Critical care was time spent personally by me on the following activities: development of treatment plan with patient and/or surrogate as well as nursing,  discussions with consultants, evaluation of patient's response to treatment, examination of patient, obtaining history from patient or surrogate, ordering and performing treatments and interventions, ordering and review of laboratory studies, ordering and review of radiographic studies, pulse oximetry and re-evaluation of patient's condition.  Patient denies presentation with a pulseless left lower extremity concerning for possible acute ischemia. Due to very poor GFR unable to perform angiogram at this time. Seen by vascular surgery admitting, heparin anticoagulation and careful medical management. ____________________________________________   INITIAL IMPRESSION / ASSESSMENT AND PLAN / ED COURSE  Pertinent labs & imaging results that were available during my care of the patient were reviewed by me and considered in my medical decision making (see chart for details).  Patient presents for left lower leg pain. No evidence trauma. Exam concerning for possible ischemia. No evidence of new compression fracture, no evidence of clear infectious etiology. Also some slight confusion per family over the last few days, and concerns of a very dark and cool left foot.  Patient is seen and discussed with Dr. Delana Meyer who advises admission, possible ischemic limb and will start on heparin therapy.  Patient and family aware and agreeable with plan for admission. ____________________________________________   FINAL CLINICAL IMPRESSION(S) / ED DIAGNOSES  Final diagnoses:  Left leg  pain  Limb ischemia      Delman Kitten, MD 07/28/15 [phone]

## 2015-07-28 NOTE — Progress Notes (Signed)
MRSA swab positive for MRSA in nares, contact precautions implemented.

## 2015-07-28 NOTE — Progress Notes (Signed)
Pt. Arrived to unit via stretcher. Pt. Transferred to bed by staff. Family at bedside. Pt. Alert and oriented. Tele applied and second verifier was Daniell, CNA. Skin assessed, second nurse, Lexi, RN. Skin is warm and dry with no issues noted. VSS. Sandwich box given to pt. General room orientation given. Consent signed for angiography. Pamphlet given to pt/family along with education on procedure. Chaplain consent put in for advance directive for family. Pt/family educated on call bell system and ascoms. Heparin drip started, verified by Baldo Ash, RN. Will continue to monitor pt.

## 2015-07-28 NOTE — Progress Notes (Signed)
ANTICOAGULATION CONSULT NOTE - Initial Consult  Pharmacy Consult for Heparin drip Indication: Ischemic leg  Allergies  Allergen Reactions  . Betadine [Povidone Iodine] Hives  . Sulfa Antibiotics   . Valium [Diazepam] Anxiety    Patient Measurements: Weight: 200 lb (90.719 kg) Heparin Dosing Weight: na  Vital Signs: Temp: 98.7 F (37.1 C) (04/02 1316) Temp Source: Rectal (04/02 1316) BP: 96/64 mmHg (04/02 1934) Pulse Rate: 66 (04/02 1934)  Labs:  Recent Labs  07/28/15 1316 07/28/15 1514  HGB 13.4  --   HCT 40.1  --   PLT 181  --   APTT  --  37*  LABPROT  --  15.0  INR  --  1.16  CREATININE 1.40*  --   CKTOTAL 32*  --     CrCl cannot be calculated (Unknown ideal weight.).   Medical History: Past Medical History  Diagnosis Date  . Hypertension   . Thyroid disorder   . Lymphoma ([place])   . Hypothyroidism   . Arthritis   . Diverticulosis   . Obstructive sleep apnea   . Myasthenia gravis ([place])   . Hip fracture, left ([place])   . Fracture of right humerus   . Asthma     Medications:  Infusions:  . sodium chloride    . heparin      Assessment: Patient admitted with ischemic leg. Pharmacy consulted for Heparin dosing. Patient was not taking anticoagulants prior to admission and pertinent labs are within range.  Goal of Therapy:  Heparin level 0.3-0.7 units/ml Monitor platelets by anticoagulation protocol: Yes   Plan:  Give 4500 units bolus x 1 Start heparin infusion at 1450 units/hr Check anti-Xa level in 8 hours and daily while on heparin Continue to monitor H&H and platelets  Paulina Fusi, PharmD, BCPS 07/28/2015 7:59 PM

## 2015-07-28 NOTE — H&P (Signed)
[facility] Admission History & Physical  MRN : [id number]  [name] is a 81 y.o. ([birth year]) female who presents with chief complaint of  Chief Complaint  Patient presents with  . Urinary Tract Infection  . Weakness  . Hip Pain  .  History of Present Illness: I am asked to evaluate [name] by the ER physician. She presents with a 2-3 day history of increasing leg pain and by exam her pedal pulses are very difficult to ascertain. Her feet are cool but not mottled by report. On discussion with the patient as well as her daughter the patient has been having increasing symptoms over the past several days. Her left leg is worse than her right leg. She notes the symptoms are much better nearly gone when she is laying down but if she sits or stands her pain is a 10 out of 10 she states it is excruciating. Her daughter also notes that when she is sitting her nails and toes turn dark blue area she has a history of paroxysmal atrial fibrillation but refused workup and is not on anticoagulation. She denies palpitations. She denies chest pain. She also has a history of DVT with PE but this is remote. She has a history of lymphoma which is stated to be cured at this time. She denies shortness of breath.  Current Facility-Administered Medications  Medication Dose Route Frequency Provider Last Rate Last Dose  . 0.9 %  sodium chloride infusion   Intravenous Continuous Katha Cabal, MD      . acetaminophen (TYLENOL) tablet 650 mg  650 mg Oral Q6H PRN Katha Cabal, MD       Or  . acetaminophen (TYLENOL) suppository 650 mg  650 mg Rectal Q6H PRN Katha Cabal, MD      . acetaminophen (TYLENOL) tablet 650 mg  650 mg Oral Q4H PRN Katha Cabal, MD      . albuterol (PROVENTIL HFA;VENTOLIN HFA) 108 (90 Base) MCG/ACT inhaler 2 puff  2 puff Inhalation Q6H PRN Katha Cabal, MD      . aspirin EC tablet 81 mg  81 mg Oral Daily Katha Cabal, MD      .  aspirin EC tablet 81 mg  81 mg Oral Daily Katha Cabal, MD      . atenolol (TENORMIN) tablet 25 mg  25 mg Oral Daily Katha Cabal, MD      . benazepril-hydrochlorthiazide (LOTENSIN HCT) 20-12.5 MG per tablet 1 tablet  1 tablet Oral Daily Katha Cabal, MD      . calcium-vitamin D (OSCAL WITH D) 500-200 MG-UNIT per tablet 1 tablet  1 tablet Oral BID Katha Cabal, MD      . carboxymethylcellulose (REFRESH PLUS) 0.5 % ophthalmic solution 1 drop  1 drop Both Eyes PRN Katha Cabal, MD      . docusate sodium (COLACE) capsule 100 mg  100 mg Oral BID Katha Cabal, MD      . docusate sodium (COLACE) capsule 100 mg  100 mg Oral BID Katha Cabal, MD      . ferrous sulfate tablet 325 mg  325 mg Oral BID WC Katha Cabal, MD      . fesoterodine (TOVIAZ) tablet 4 mg  4 mg Oral Daily Katha Cabal, MD      . HYDROcodone-acetaminophen (NORCO/VICODIN) 5-325 MG per tablet 1-2 tablet  1-2 tablet Oral Q4H PRN Katha Cabal, MD      .  levothyroxine (SYNTHROID, LEVOTHROID) tablet 75 mcg  75 mcg Oral Daily Katha Cabal, MD      . magnesium hydroxide (MILK OF MAGNESIA) suspension 30 mL  30 mL Oral Daily PRN Katha Cabal, MD      . menthol-cetylpyridinium (CEPACOL) lozenge 3 mg  1 lozenge Oral PRN Katha Cabal, MD      . morphine 2 MG/ML injection 2 mg  2 mg Intravenous Q1H PRN Katha Cabal, MD      . ondansetron [facility]) tablet 4 mg  4 mg Oral Q6H PRN Katha Cabal, MD       Or  . ondansetron [facility]) injection 4 mg  4 mg Intravenous Q6H PRN Katha Cabal, MD      . oxyCODONE (Oxy IR/ROXICODONE) immediate release tablet 5 mg  5 mg Oral BID PRN Katha Cabal, MD      . polyethylene glycol (MIRALAX / GLYCOLAX) packet 17 g  17 g Oral Daily Katha Cabal, MD      . QUEtiapine (SEROQUEL) tablet 25 mg  25 mg Oral TID Katha Cabal, MD      . sodium chloride flush (NS) 0.9 % injection 3 mL  3 mL Intravenous Q12H Katha Cabal, MD      .  sodium phosphate (FLEET) 7-19 GM/118ML enema 1 enema  1 enema Rectal Once PRN Katha Cabal, MD      . sorbitol 70 % solution 30 mL  30 mL Oral Daily PRN Katha Cabal, MD       Current Outpatient Prescriptions  Medication Sig Dispense Refill  . acetaminophen (TYLENOL) 325 MG tablet Take 650 mg by mouth every 4 (four) hours as needed for mild pain.    Marland Kitchen albuterol (PROVENTIL HFA;VENTOLIN HFA) 108 (90 BASE) MCG/ACT inhaler Inhale 2 puffs into the lungs every 6 (six) hours as needed for shortness of breath.    Marland Kitchen aspirin EC 81 MG tablet Take 81 mg by mouth daily.    Marland Kitchen atenolol (TENORMIN) 25 MG tablet Take 25 mg by mouth daily.     . benazepril-hydrochlorthiazide (LOTENSIN HCT) 20-12.5 MG per tablet Take 1 tablet by mouth daily.     . calcium-vitamin D (OSCAL WITH D) 500-200 MG-UNIT per tablet Take 1 tablet by mouth 2 (two) times daily.    . carboxymethylcellulose (REFRESH PLUS) 0.5 % SOLN Place 1 drop into both eyes as needed.    . clotrimazole-betamethasone (LOTRISONE) cream as needed.     . docusate sodium (COLACE) 100 MG capsule Take 100 mg by mouth 2 (two) times daily.    . feeding supplement, ENSURE ENLIVE, (ENSURE ENLIVE) LIQD Take 237 mLs by mouth 2 (two) times daily between meals. 237 mL 12  . ferrous sulfate 325 (65 FE) MG tablet Take 325 mg by mouth 2 (two) times daily with a meal.    . levothyroxine (SYNTHROID, LEVOTHROID) 75 MCG tablet Take 75 mcg by mouth daily.     Marland Kitchen lidocaine (XYLOCAINE) 5 % ointment Apply 1 application topically every 4 (four) hours as needed (rectal pain).    Marland Kitchen menthol-cetylpyridinium (CEPACOL) 3 MG lozenge Take 1 lozenge by mouth as needed for sore throat.    . Multiple Vitamins-Minerals (MULTIVITAMIN WITH MINERALS) tablet Take 1 tablet by mouth daily.    Marland Kitchen oxyCODONE (OXY IR/ROXICODONE) 5 MG immediate release tablet Take 1 tablet (5 mg total) by mouth 2 (two) times daily as needed for moderate pain or severe pain. 15 tablet 0  .  polyethylene glycol (MIRALAX  / GLYCOLAX) packet Take 17 g by mouth daily.    . QUEtiapine (SEROQUEL) 25 MG tablet Take 1 tablet (25 mg total) by mouth 3 (three) times daily. 30 tablet 0  . tolterodine (DETROL LA) 4 MG 24 hr capsule Take 4 mg by mouth daily.      Past Medical History  Diagnosis Date  . Hypertension   . Thyroid disorder   . Lymphoma ([place])   . Hypothyroidism   . Arthritis   . Diverticulosis   . Obstructive sleep apnea   . Myasthenia gravis ([place])   . Hip fracture, left ([place])   . Fracture of right humerus   . Asthma     Past Surgical History  Procedure Laterality Date  . Nm pet lymphoma    . Hernia repair    . Cholecystectomy    . Back surgery      Social History Social History  Substance Use Topics  . Smoking status: Never Smoker   . Smokeless tobacco: Never Used  . Alcohol Use: No    Family History Family History  Problem Relation Age of Onset  . Heart Problems Mother   . Cancer Father    no family history of bleeding clotting disorders porphyria or autoimmune disease  Allergies  Allergen Reactions  . Betadine [Povidone Iodine] Hives  . Sulfa Antibiotics   . Valium [Diazepam] Anxiety     REVIEW OF SYSTEMS (Negative unless checked)  Constitutional: [] Weight loss  [] Fever  [] Chills Cardiac: [] Chest pain   [] Chest pressure   [] Palpitations   [] Shortness of breath when laying flat   [] Shortness of breath at rest   [] Shortness of breath with exertion. Vascular:  [] Pain in legs with walking   [] Pain in legs at rest   [] Pain in legs when laying flat   [] Claudication   [] Pain in feet when walking  [] Pain in feet at rest  [] Pain in feet when laying flat   [] History of DVT   [] Phlebitis   [] Swelling in legs   [] Varicose veins   [] Non-healing ulcers Pulmonary:   [] Uses home oxygen   [] Productive cough   [] Hemoptysis   [] Wheeze  [] COPD   [] Asthma Neurologic:  [] Dizziness  [] Blackouts   [] Seizures   [] History of stroke   [] History of TIA  [] Aphasia   [] Temporary blindness   [] Dysphagia    [] Weakness or numbness in arms   [] Weakness or numbness in legs Musculoskeletal:  [] Arthritis   [] Joint swelling   [] Joint pain   [] Low back pain Hematologic:  [] Easy bruising  [] Easy bleeding   [] Hypercoagulable state   [] Anemic  [] Hepatitis Gastrointestinal:  [] Blood in stool   [] Vomiting blood  [] Gastroesophageal reflux/heartburn   [] Difficulty swallowing. Genitourinary:  [] Chronic kidney disease   [] Difficult urination  [] Frequent urination  [] Burning with urination   [] Blood in urine Skin:  [] Rashes   [] Ulcers   [] Wounds Psychological:  [] History of anxiety   []  History of major depression.  Physical Examination  Filed Vitals:   07/28/15 1316  BP: 100/49  Pulse: 73  Temp: 98.7 F (37.1 C)  TempSrc: Rectal  Resp: 20  Weight: 90.719 kg (200 lb)  SpO2: 96%   Body mass index is 37.81 kg/(m^2). Gen: WD/WN, NAD Head: /AT, No temporalis wasting.  Ear/Nose/Throat: Hearing grossly intact, nares w/o erythema or drainage, oropharynx w/o Erythema/Exudate, Eyes: PERRLA, EOMI.  Neck: Supple, no nuchal rigidity.  No bruit or JVD.  Pulmonary:  Good air movement, clear to auscultation bilaterally,  no increased work of respiration or use of accessory muscles  Cardiac: RRR, normal S1, S2, no Murmurs, rubs or gallops. Vascular: Legs are warm to the ankle feet are cool but not cold there is no mottling there is normal motor sensory function in fact she is able to wiggle her toes quite briskly. Femoral dorsalis pedis and posterior tibial pulses are not palpable there is moderate swelling of the left leg. Vessel Right Left  Radial Palpable Palpable  Ulnar Palpable Palpable  Brachial Palpable Palpable  Carotid Palpable, without bruit Palpable, without bruit  Aorta Not palpable N/A  Femoral  not Palpable  not Palpable  Popliteal  not Palpable  not Palpable  PT  not Palpable  not Palpable  DP  not Palpable  not Palpable   Gastrointestinal: soft, non-tender/non-distended. No guarding/reflex. No  masses, surgical incisions, or scars. Musculoskeletal: M/S 5/5 throughout.  No deformity or atrophy. Neurologic: CN 2-12 intact. Pain and light touch intact in extremities.  Symmetrical.  Speech is fluent. Motor exam as listed above. Psychiatric: Judgment intact, Mood & affect appropriate for pt's clinical situation. Dermatologic: No rashes or ulcers noted.  No cellulitis or open wounds. Lymph : No Cervical, Axillary, or Inguinal lymphadenopathy.  CBC Lab Results  Component Value Date   WBC 11.2* 07/28/2015   HGB 13.4 07/28/2015   HCT 40.1 07/28/2015   MCV 90.9 07/28/2015   PLT 181 07/28/2015    BMET    Component Value Date/Time   NA 132* 07/28/2015 1316   NA 137 05/17/2014 1834   K 4.0 07/28/2015 1316   K 4.4 05/17/2014 1834   CL 98* 07/28/2015 1316   CL 100 05/17/2014 1834   CO2 27 07/28/2015 1316   CO2 30 05/17/2014 1834   GLUCOSE 97 07/28/2015 1316   GLUCOSE 99 05/17/2014 1834   BUN 28* 07/28/2015 1316   BUN 15 05/17/2014 1834   CREATININE 1.40* 07/28/2015 1316   CREATININE 1.13 05/17/2014 1834   CALCIUM 8.9 07/28/2015 1316   CALCIUM 8.7 05/17/2014 1834   GFRNONAA 33* 07/28/2015 1316   GFRNONAA 49* 05/17/2014 1834   GFRNONAA 35* 11/29/2012 1335   GFRNONAA 35* 11/29/2012 1335   GFRAA 39* 07/28/2015 1316   GFRAA 59* 05/17/2014 1834   GFRAA 41* 11/29/2012 1335   GFRAA 40* 11/29/2012 1335   CrCl cannot be calculated (Unknown ideal weight.).  COAG Lab Results  Component Value Date   INR 1.16 07/28/2015   INR 1.00 09/19/2014   INR 1.0 03/14/2014    Radiology Dg Lumbar Spine 2-3 Views  07/28/2015  CLINICAL DATA:  Low back and left hip pain.  No time course given. EXAM: LUMBAR SPINE - 2-3 VIEW; DG HIP (WITH OR WITHOUT PELVIS) 2-3V LEFT COMPARISON:  CT scan 09/19/2014 FINDINGS: Lumbar spine: Left convex lumbar scoliosis and degenerative lumbar spondylosis. Stable vertebral augmentation changes at L2. There is also a remote compression fracture of L1. No acute bony  findings. Stable advanced facet disease. No obvious pars defects. Stable aortic and iliac artery calcifications. The visualized bony pelvis is intact. Pelvis/left hip: Both hips are normally located. Moderate degenerative changes bilaterally. The pubic symphysis and SI joints are intact. No pelvic fractures. Remote intra medullary rod and proximal dynamic hip screw transfixing a remote intertrochanteric fracture. No acute bony findings. The hardware is intact. IMPRESSION: 1. Remote lumbar compression fractures. No acute abnormality. Stable scoliosis and degenerative lumbar spondylosis. 2. Intact left hip hardware.  No acute hip or pelvic fracture. Electronically Signed   By:  Marijo Sanes M.D.   On: 07/28/2015 16:25   Dg Hip Unilat With Pelvis 2-3 Views Left  07/28/2015  CLINICAL DATA:  Low back and left hip pain.  No time course given. EXAM: LUMBAR SPINE - 2-3 VIEW; DG HIP (WITH OR WITHOUT PELVIS) 2-3V LEFT COMPARISON:  CT scan 09/19/2014 FINDINGS: Lumbar spine: Left convex lumbar scoliosis and degenerative lumbar spondylosis. Stable vertebral augmentation changes at L2. There is also a remote compression fracture of L1. No acute bony findings. Stable advanced facet disease. No obvious pars defects. Stable aortic and iliac artery calcifications. The visualized bony pelvis is intact. Pelvis/left hip: Both hips are normally located. Moderate degenerative changes bilaterally. The pubic symphysis and SI joints are intact. No pelvic fractures. Remote intra medullary rod and proximal dynamic hip screw transfixing a remote intertrochanteric fracture. No acute bony findings. The hardware is intact. IMPRESSION: 1. Remote lumbar compression fractures. No acute abnormality. Stable scoliosis and degenerative lumbar spondylosis. 2. Intact left hip hardware.  No acute hip or pelvic fracture. Electronically Signed   By: Marijo Sanes M.D.   On: 07/28/2015 16:25    Assessment/Plan 1.  Painful lower extremities: It is  difficult to ascertain the etiology this could be a phlegmasia secondary to DVT or perhaps arterial insufficiency secondary to embolization from her atrial fibrillation. I would support the former given her symptoms are much worse with sitting and standing and arterial insufficiency should find this to be a better less painful position. Also by physical examination her lower extremities do not appear to be profoundly ischemic and she seems to been tolerating this process for several days. However given her history and lack of pedal pulses arterial insufficiency must also be considered. Given her renal insufficiency CT angiography cannot be obtained and would not be appropriate at this time. I will admit the patient. I will plan for heparinization and hydration with normal saline. I will obtain a stat venous ultrasound to exclude DVT. If the ultrasound is positive then we will continue with anticoagulation the ultrasound is negative then I will move forward with angiography to define her arterial insufficiency. Hydration overnight should help with her renal insufficiency. 2.  Atrial fibrillation: She will be initiated on heparin for now telemetry will also be obtained medical consult will be obtained and likely she will need further cardiac workup. 3.  Renal insufficiency:  I will hydrate with normal saline overnight and reassess her renal function the morning. 4.  Asthma/COPD:  The patient will be continued on her home meds medicine will follow. 5.  Hypertension: Patient will be continued on her home meds medicine will follow 4.  Betadine allergy:  She will need dye prophylaxis if angiography is required.   Schnier, Dolores Lory, MD  07/28/2015 5:56 PM

## 2015-07-28 NOTE — ED Notes (Signed)
Patient from [facility] with complaint of generalized weakness with possible UTI and L Hip pain S/P surgery 2 years ago.

## 2015-07-29 ENCOUNTER — Encounter
Admission: EM | Disposition: A | Discharge status: Hospice | Payer: Self-pay | Source: Home / Self Care | Attending: Vascular Surgery

## 2015-07-29 ENCOUNTER — Inpatient Hospital Stay: Payer: PPO | Admitting: Anesthesiology

## 2015-07-29 ENCOUNTER — Encounter: Payer: Self-pay | Admitting: Respiratory Therapy

## 2015-07-29 ENCOUNTER — Inpatient Hospital Stay: Payer: PPO

## 2015-07-29 HISTORY — PX: PERIPHERAL VASCULAR CATHETERIZATION: SHX172C

## 2015-07-29 HISTORY — PX: EMBOLECTOMY: SHX44

## 2015-07-29 LAB — CBC
HCT: 36.2 % (ref 35.0–47.0)
HCT: 32.7 % — ABNORMAL LOW (ref 35.0–47.0)
HEMATOCRIT: 43.9 % (ref 35.0–47.0)
HEMATOCRIT: 36.4 % (ref 35.0–47.0)
HEMOGLOBIN: 14.5 g/dL (ref 12.0–16.0)
HEMOGLOBIN: 11.2 g/dL — AB (ref 12.0–16.0)
Hemoglobin: 12.3 g/dL (ref 12.0–16.0)
Hemoglobin: 12.3 g/dL (ref 12.0–16.0)
MCH: 30.3 pg (ref 26.0–34.0)
MCH: 29.8 pg (ref 26.0–34.0)
MCH: 30.9 pg (ref 26.0–34.0)
MCH: 31.1 pg (ref 26.0–34.0)
MCHC: 33.9 g/dL (ref 32.0–36.0)
MCHC: 33.1 g/dL (ref 32.0–36.0)
MCHC: 33.7 g/dL (ref 32.0–36.0)
MCHC: 34.1 g/dL (ref 32.0–36.0)
MCV: 89.5 fL (ref 80.0–100.0)
MCV: 89.9 fL (ref 80.0–100.0)
MCV: 91.6 fL (ref 80.0–100.0)
MCV: 91 fL (ref 80.0–100.0)
PLATELETS: 167 10*3/uL (ref 150–440)
Platelets: 213 10*3/uL (ref 150–440)
Platelets: 203 10*3/uL (ref 150–440)
Platelets: 204 10*3/uL (ref 150–440)
RBC: 4.04 MIL/uL (ref 3.80–5.20)
RBC: 4.89 MIL/uL (ref 3.80–5.20)
RBC: 3.97 MIL/uL (ref 3.80–5.20)
RBC: 3.59 MIL/uL — AB (ref 3.80–5.20)
RDW: 14.3 % (ref 11.5–14.5)
RDW: 14.5 % (ref 11.5–14.5)
RDW: 14.4 % (ref 11.5–14.5)
RDW: 14.2 % (ref 11.5–14.5)
WBC: 8.9 10*3/uL (ref 3.6–11.0)
WBC: 8.9 10*3/uL (ref 3.6–11.0)
WBC: 15.9 10*3/uL — AB (ref 3.6–11.0)
WBC: 14.5 10*3/uL — ABNORMAL HIGH (ref 3.6–11.0)

## 2015-07-29 LAB — APTT: aPTT: 89 seconds — ABNORMAL HIGH (ref 24–36)

## 2015-07-29 LAB — HEPARIN LEVEL (UNFRACTIONATED)
HEPARIN UNFRACTIONATED: 0.58 [IU]/mL (ref 0.30–0.70)
Heparin Unfractionated: 1.26 IU/mL — ABNORMAL HIGH (ref 0.30–0.70)

## 2015-07-29 LAB — BASIC METABOLIC PANEL
ANION GAP: 4 — AB (ref 5–15)
BUN: 29 mg/dL — AB (ref 6–20)
CALCIUM: 8.7 mg/dL — AB (ref 8.9–10.3)
CO2: 31 mmol/L (ref 22–32)
Chloride: 101 mmol/L (ref 101–111)
Creatinine, Ser: 1.24 mg/dL — ABNORMAL HIGH (ref 0.44–1.00)
GFR calc Af Amer: 45 mL/min — ABNORMAL LOW (ref >60)
GFR, EST NON AFRICAN AMERICAN: 39 mL/min — AB (ref >60)
Glucose, Bld: 149 mg/dL — ABNORMAL HIGH (ref 65–99)
POTASSIUM: 4 mmol/L (ref 3.5–5.1)
SODIUM: 136 mmol/L (ref 135–145)

## 2015-07-29 LAB — ABO/RH: ABO/RH(D): A POS

## 2015-07-29 LAB — FIBRINOGEN: Fibrinogen: >750 mg/dL — ABNORMAL HIGH (ref 210–470)

## 2015-07-29 SURGERY — EMBOLECTOMY
Anesthesia: General | Laterality: Left

## 2015-07-29 SURGERY — EMBOLECTOMY
Anesthesia: General | Site: Leg Upper | Laterality: Left | Wound class: Clean Contaminated

## 2015-07-29 SURGERY — LOWER EXTREMITY ANGIOGRAPHY
Anesthesia: Moderate Sedation

## 2015-07-29 SURGERY — LOWER EXTREMITY ANGIOGRAPHY
Anesthesia: IV Sedation (MBSC Only) | Laterality: Bilateral

## 2015-07-29 MED ORDER — FENTANYL CITRATE (PF) 100 MCG/2ML IJ SOLN
INTRAMUSCULAR | Status: AC
  Filled 2015-07-29: qty 2

## 2015-07-29 MED ORDER — LIDOCAINE-EPINEPHRINE (PF) 1 %-1:200000 IJ SOLN
INTRAMUSCULAR | Status: AC
  Filled 2015-07-29: qty 30

## 2015-07-29 MED ORDER — SODIUM CHLORIDE 0.9% FLUSH
3.0000 mL | INTRAVENOUS | Status: DC | PRN
  Administered 2015-08-04: 3 mL via INTRAVENOUS
  Filled 2015-07-29: qty 3

## 2015-07-29 MED ORDER — HEPARIN SODIUM (PORCINE) 1000 UNIT/ML IJ SOLN
INTRAMUSCULAR | Status: DC | PRN
  Administered 2015-07-29: 3000 [IU] via INTRAVENOUS

## 2015-07-29 MED ORDER — IPRATROPIUM-ALBUTEROL 0.5-2.5 (3) MG/3ML IN SOLN
RESPIRATORY_TRACT | Status: AC
  Administered 2015-07-29: 3 mL
  Filled 2015-07-29: qty 3

## 2015-07-29 MED ORDER — EPHEDRINE SULFATE 50 MG/ML IJ SOLN
INTRAMUSCULAR | Status: DC | PRN
  Administered 2015-07-29: 20 mg via INTRAVENOUS

## 2015-07-29 MED ORDER — MIDAZOLAM HCL 2 MG/2ML IJ SOLN
1.0000 mg | INTRAMUSCULAR | Status: DC | PRN
  Administered 2015-07-29 – 2015-07-30 (×2): 1 mg via INTRAVENOUS
  Filled 2015-07-29: qty 2

## 2015-07-29 MED ORDER — FENTANYL 2500MCG IN NS 250ML (10MCG/ML) PREMIX INFUSION
25.0000 ug/h | INTRAVENOUS | Status: DC
  Administered 2015-07-29: 50 ug/h via INTRAVENOUS
  Filled 2015-07-29: qty 250

## 2015-07-29 MED ORDER — FUROSEMIDE 10 MG/ML IJ SOLN: 20.0000 mg | Freq: Once | INTRAMUSCULAR | Status: DC

## 2015-07-29 MED ORDER — SODIUM CHLORIDE 0.9 % IV SOLN
10.0000 mg | INTRAVENOUS | Status: AC
  Administered 2015-07-29: 10 mg via INTRAVENOUS
  Filled 2015-07-29: qty 10

## 2015-07-29 MED ORDER — HEPARIN (PORCINE) IN NACL 2-0.9 UNIT/ML-% IJ SOLN
INTRAMUSCULAR | Status: AC
  Filled 2015-07-29: qty 1000

## 2015-07-29 MED ORDER — HEPARIN (PORCINE) IN NACL 100-0.45 UNIT/ML-% IJ SOLN
1200.0000 [IU]/h | INTRAMUSCULAR | Status: DC
  Administered 2015-07-29: 1200 [IU]/h via INTRAVENOUS

## 2015-07-29 MED ORDER — DEXTROSE 5 % IV SOLN
1.5000 g | Freq: Two times a day (BID) | INTRAVENOUS | Status: AC
  Administered 2015-07-30 (×2): 1.5 g via INTRAVENOUS
  Filled 2015-07-29 (×2): qty 1.5

## 2015-07-29 MED ORDER — SODIUM CHLORIDE 0.9 % IV SOLN
INTRAVENOUS | Status: DC | PRN
  Administered 2015-07-29 (×2): via INTRAVENOUS

## 2015-07-29 MED ORDER — HEPARIN (PORCINE) IN NACL 100-0.45 UNIT/ML-% IJ SOLN
INTRAMUSCULAR | Status: AC
  Filled 2015-07-29: qty 250

## 2015-07-29 MED ORDER — IPRATROPIUM-ALBUTEROL 0.5-2.5 (3) MG/3ML IN SOLN: 3.0000 mL | Freq: Four times a day (QID) | RESPIRATORY_TRACT | Status: DC

## 2015-07-29 MED ORDER — DEXTROSE 5 % IV SOLN
1.5000 g | INTRAVENOUS | Status: DC | PRN
  Administered 2015-07-29: 1.5 g via INTRAVENOUS

## 2015-07-29 MED ORDER — IOPAMIDOL (ISOVUE-300) INJECTION 61%
INTRAVENOUS | Status: DC | PRN
  Administered 2015-07-29: 60 mL via INTRA_ARTERIAL

## 2015-07-29 MED ORDER — HEPARIN SODIUM (PORCINE) 1000 UNIT/ML IJ SOLN
INTRAMUSCULAR | Status: AC
  Filled 2015-07-29: qty 1

## 2015-07-29 MED ORDER — BUPIVACAINE HCL (PF) 0.5 % IJ SOLN
INTRAMUSCULAR | Status: DC | PRN
  Administered 2015-07-29: 15 mL

## 2015-07-29 MED ORDER — LIDOCAINE HCL (PF) 1 % IJ SOLN
INTRAMUSCULAR | Status: DC | PRN
  Administered 2015-07-29 (×2): 5 mL via INTRADERMAL

## 2015-07-29 MED ORDER — MIDAZOLAM HCL 2 MG/2ML IJ SOLN: 1.0000 mg | INTRAMUSCULAR | Status: DC | PRN

## 2015-07-29 MED ORDER — FENTANYL CITRATE (PF) 100 MCG/2ML IJ SOLN
50.0000 ug | Freq: Once | INTRAMUSCULAR | Status: AC
  Administered 2015-07-29: 50 ug via INTRAVENOUS

## 2015-07-29 MED ORDER — FUROSEMIDE 10 MG/ML IJ SOLN
INTRAMUSCULAR | Status: AC
  Administered 2015-07-29: 20 mg
  Filled 2015-07-29: qty 2

## 2015-07-29 MED ORDER — HEPARIN SODIUM (PORCINE) 5000 UNIT/ML IJ SOLN
INTRAMUSCULAR | Status: AC
  Filled 2015-07-29: qty 1

## 2015-07-29 MED ORDER — MORPHINE SULFATE (PF) 4 MG/ML IV SOLN: 5.0000 mg | INTRAVENOUS | Status: DC | PRN

## 2015-07-29 MED ORDER — SODIUM CHLORIDE 0.9 % IV SOLN
INTRAVENOUS | Status: DC | PRN
  Administered 2015-07-29: 500 mL via INTRAMUSCULAR

## 2015-07-29 MED ORDER — SODIUM BICARBONATE BOLUS VIA INFUSION
INTRAVENOUS | Status: AC
  Administered 2015-07-29: 08:00:00 via INTRAVENOUS
  Filled 2015-07-29: qty 1

## 2015-07-29 MED ORDER — MIDAZOLAM HCL 5 MG/5ML IJ SOLN
INTRAMUSCULAR | Status: AC
  Filled 2015-07-29: qty 5

## 2015-07-29 MED ORDER — SUCCINYLCHOLINE CHLORIDE 20 MG/ML IJ SOLN
INTRAMUSCULAR | Status: DC | PRN
  Administered 2015-07-29: 50 mg via INTRAVENOUS

## 2015-07-29 MED ORDER — MIDAZOLAM HCL 2 MG/2ML IJ SOLN
INTRAMUSCULAR | Status: DC | PRN
  Administered 2015-07-29 (×2): 1 mg via INTRAVENOUS

## 2015-07-29 MED ORDER — SODIUM CHLORIDE 0.9 % IV SOLN
5.0000 mg | INTRAVENOUS | Status: AC
  Administered 2015-07-29: 5 mg via INTRAVENOUS
  Filled 2015-07-29 (×2): qty 5

## 2015-07-29 MED ORDER — 0.9 % SODIUM CHLORIDE (POUR BTL) OPTIME
TOPICAL | Status: DC | PRN
  Administered 2015-07-29: 2000 mL

## 2015-07-29 MED ORDER — PROPOFOL 10 MG/ML IV BOLUS
INTRAVENOUS | Status: DC | PRN
  Administered 2015-07-29: 100 mg via INTRAVENOUS

## 2015-07-29 MED ORDER — SODIUM CHLORIDE 0.9 % IV SOLN: 250.0000 mL | INTRAVENOUS | Status: DC | PRN

## 2015-07-29 MED ORDER — SODIUM BICARBONATE 8.4 % IV SOLN
INTRAVENOUS | Status: AC
  Filled 2015-07-29 (×2): qty 500

## 2015-07-29 MED ORDER — TENECTEPLASE 50 MG IV KIT: 1.0000 mg/h | PACK | INTRAVENOUS | Status: DC

## 2015-07-29 MED ORDER — MIDAZOLAM HCL 2 MG/2ML IJ SOLN
INTRAMUSCULAR | Status: DC | PRN
  Administered 2015-07-29 (×3): 1 mg via INTRAVENOUS

## 2015-07-29 MED ORDER — ROCURONIUM BROMIDE 100 MG/10ML IV SOLN
INTRAVENOUS | Status: DC | PRN
  Administered 2015-07-29: 20 mg via INTRAVENOUS

## 2015-07-29 MED ORDER — FENTANYL CITRATE (PF) 100 MCG/2ML IJ SOLN
INTRAMUSCULAR | Status: DC | PRN
  Administered 2015-07-29 (×3): 50 ug via INTRAVENOUS

## 2015-07-29 MED ORDER — FENTANYL CITRATE (PF) 100 MCG/2ML IJ SOLN
INTRAMUSCULAR | Status: DC | PRN
  Administered 2015-07-29: 25 ug via INTRAVENOUS
  Administered 2015-07-29: 50 ug via INTRAVENOUS

## 2015-07-29 MED ORDER — FENTANYL BOLUS VIA INFUSION
25.0000 ug | INTRAVENOUS | Status: DC | PRN
  Filled 2015-07-29: qty 25

## 2015-07-29 MED ORDER — ONDANSETRON HCL 4 MG/2ML IJ SOLN: 4.0000 mg | Freq: Four times a day (QID) | INTRAMUSCULAR | Status: DC | PRN

## 2015-07-29 MED ORDER — FENTANYL CITRATE (PF) 100 MCG/2ML IJ SOLN
INTRAMUSCULAR | Status: DC | PRN
  Administered 2015-07-29 (×2): 25 ug via INTRAVENOUS
  Administered 2015-07-29 (×2): 50 ug via INTRAVENOUS

## 2015-07-29 MED ORDER — MORPHINE SULFATE (PF) 2 MG/ML IV SOLN
2.0000 mg | INTRAVENOUS | Status: DC | PRN
  Administered 2015-07-31: 2 mg via INTRAVENOUS
  Filled 2015-07-29: qty 1

## 2015-07-29 MED ORDER — MIDAZOLAM HCL 2 MG/2ML IJ SOLN
INTRAMUSCULAR | Status: DC | PRN
Start: 2015-07-29 — End: 2015-07-29
  Administered 2015-07-29: 2 mg via INTRAVENOUS
  Administered 2015-07-29: 0.5 mg via INTRAVENOUS

## 2015-07-29 MED ORDER — LIDOCAINE HCL (PF) 1 % IJ SOLN
INTRAMUSCULAR | Status: AC
  Filled 2015-07-29: qty 30

## 2015-07-29 MED ORDER — HEPARIN (PORCINE) IN NACL 100-0.45 UNIT/ML-% IJ SOLN
400.0000 [IU]/h | INTRAMUSCULAR | Status: DC
  Administered 2015-07-29 (×2): 400 [IU]/h via INTRAVENOUS
  Filled 2015-07-29: qty 250

## 2015-07-29 MED ORDER — SODIUM CHLORIDE 0.9% FLUSH
3.0000 mL | Freq: Two times a day (BID) | INTRAVENOUS | Status: DC
  Administered 2015-07-29 – 2015-08-05 (×12): 3 mL via INTRAVENOUS

## 2015-07-29 MED ORDER — BUPIVACAINE HCL (PF) 0.5 % IJ SOLN
INTRAMUSCULAR | Status: AC
  Filled 2015-07-29: qty 30

## 2015-07-29 MED ORDER — HEPARIN (PORCINE) IN NACL 2-0.9 UNIT/ML-% IJ SOLN
INTRAMUSCULAR | Status: AC
  Filled 2015-07-29: qty 1500

## 2015-07-29 SURGICAL SUPPLY — 10 items
CATH INFUS 90CMX10CM (CATHETERS) ×6 IMPLANT
CATH PIG 70CM (CATHETERS) ×4 IMPLANT
KIT CATH CVC 3 LUMEN 7FR 8IN (MISCELLANEOUS) ×2 IMPLANT
PACK ANGIOGRAPHY (CUSTOM PROCEDURE TRAY) ×4 IMPLANT
SET INTRO CAPELLA COAXIAL (SET/KITS/TRAYS/PACK) ×4 IMPLANT
SHEATH BRITE TIP 5FRX11 (SHEATH) ×4 IMPLANT
SUT SILK 0 FSL (SUTURE) ×3 IMPLANT
SYR MEDRAD MARK V 150ML (SYRINGE) ×4 IMPLANT
TUBING CONTRAST HIGH PRESS 72 (TUBING) ×4 IMPLANT
WIRE J 3MM .035X145CM (WIRE) ×4 IMPLANT

## 2015-07-29 SURGICAL SUPPLY — 70 items
APPLIER CLIP 11 MED OPEN (CLIP) ×3
APPLIER CLIP 13 LRG OPEN (CLIP) ×3
APPLIER CLIP 9.375 SM OPEN (CLIP)
APR CLP LRG 13 20 CLIP (CLIP) ×1
APR CLP MED 11 20 MLT OPN (CLIP) ×1
APR CLP SM 9.3 20 MLT OPN (CLIP)
BAG DECANTER FOR FLEXI CONT (MISCELLANEOUS) ×3 IMPLANT
BAG ISL LRG 20X20 DRWSTRG (DRAPES) ×1
BAG ISOLATATION DRAPE 20X20 ST (DRAPES) ×1 IMPLANT
BLADE SURG SZ11 CARB STEEL (BLADE) ×3 IMPLANT
BRUSH SCRUB 4% CHG (MISCELLANEOUS) ×1 IMPLANT
CANISTER SUCT 1200ML W/VALVE (MISCELLANEOUS) ×3 IMPLANT
CATH EMBOLECTOMY 3X80 (CATHETERS) ×3 IMPLANT
CATH FOGERTY 4X80 WAS (CATHETERS) ×2 IMPLANT
CHLORAPREP W/TINT 26ML (MISCELLANEOUS) ×2 IMPLANT
CLIP APPLIE 11 MED OPEN (CLIP) IMPLANT
CLIP APPLIE 13 LRG OPEN (CLIP) IMPLANT
CLIP APPLIE 9.375 SM OPEN (CLIP) IMPLANT
DRAPE IMP U-DRAPE 54X76 (DRAPES) ×3 IMPLANT
DRAPE INCISE IOBAN 66X45 STRL (DRAPES) ×3 IMPLANT
DRAPE ISOLATE BAG 20X20 STRL (DRAPES) ×2
DRAPE SHEET LG 3/4 BI-LAMINATE (DRAPES) ×3 IMPLANT
DRAPE WOUND VAC 10X15X1CM (MISCELLANEOUS) ×2 IMPLANT
DRESSING SURGICEL FIBRLLR 1X2 (HEMOSTASIS) ×1 IMPLANT
DRSG SURGICEL FIBRILLAR 1X2 (HEMOSTASIS) ×3
DURAPREP 26ML APPLICATOR (WOUND CARE) ×2 IMPLANT
ELECT REM PT RETURN 9FT ADLT (ELECTROSURGICAL) ×3
ELECTRODE REM PT RTRN 9FT ADLT (ELECTROSURGICAL) ×1 IMPLANT
GLOVE BIO SURGEON STRL SZ7 (GLOVE) ×4 IMPLANT
GLOVE BIOGEL PI IND STRL 7.5 (GLOVE) IMPLANT
GLOVE BIOGEL PI INDICATOR 7.5 (GLOVE) ×4
GLOVE SURG SYN 8.0 (GLOVE) ×3 IMPLANT
GLOVE SURG SYN 8.0 PF PI (GLOVE) ×1 IMPLANT
GOWN STRL REUS W/ TWL LRG LVL3 (GOWN DISPOSABLE) ×1 IMPLANT
GOWN STRL REUS W/ TWL XL LVL3 (GOWN DISPOSABLE) ×1 IMPLANT
GOWN STRL REUS W/TWL LRG LVL3 (GOWN DISPOSABLE) ×6
GOWN STRL REUS W/TWL XL LVL3 (GOWN DISPOSABLE) ×3
IV NS 500ML (IV SOLUTION) ×3
IV NS 500ML BAXH (IV SOLUTION) ×1 IMPLANT
KIT RM TURNOVER STRD PROC AR (KITS) ×3 IMPLANT
LABEL OR SOLS (LABEL) ×3 IMPLANT
LIQUID BAND (GAUZE/BANDAGES/DRESSINGS) ×1 IMPLANT
LOOP RED MAXI  1X406MM (MISCELLANEOUS) ×2
LOOP VESSEL MAXI 1X406 RED (MISCELLANEOUS) ×1 IMPLANT
NDL HYPO 18GX1.5 BLUNT FILL (NEEDLE) ×1 IMPLANT
NEEDLE HYPO 18GX1.5 BLUNT FILL (NEEDLE) ×3 IMPLANT
NEEDLE HYPO 25X1 1.5 SAFETY (NEEDLE) ×3 IMPLANT
NS IRRIG 1000ML POUR BTL (IV SOLUTION) ×5 IMPLANT
PACK BASIN MAJOR ARMC (MISCELLANEOUS) ×1 IMPLANT
PACK UNIVERSAL (MISCELLANEOUS) ×3 IMPLANT
PAD NEG PRESSURE SENSATRAC (MISCELLANEOUS) ×2 IMPLANT
STOCKINETTE M/LG 89821 (MISCELLANEOUS) ×3 IMPLANT
SUT MNCRL+ 5-0 UNDYED PC-3 (SUTURE) ×1 IMPLANT
SUT MONOCRYL 5-0 (SUTURE) ×2
SUT PROLENE 5 0 RB 1 DA (SUTURE) ×6 IMPLANT
SUT PROLENE 6 0 BV (SUTURE) ×14 IMPLANT
SUT SILK 2 0 (SUTURE)
SUT SILK 2-0 18XBRD TIE 12 (SUTURE) ×1 IMPLANT
SUT SILK 3 0 (SUTURE)
SUT SILK 3-0 18XBRD TIE 12 (SUTURE) ×1 IMPLANT
SUT SILK 4 0 (SUTURE) ×3
SUT SILK 4-0 18XBRD TIE 12 (SUTURE) ×1 IMPLANT
SUT VIC AB 2-0 CT1 27 (SUTURE) ×6
SUT VIC AB 2-0 CT1 TAPERPNT 27 (SUTURE) ×2 IMPLANT
SUT VIC AB 3-0 SH 27 (SUTURE) ×3
SUT VIC AB 3-0 SH 27X BRD (SUTURE) ×1 IMPLANT
SUT VICRYL+ 3-0 36IN CT-1 (SUTURE) ×6 IMPLANT
SYR 10ML SLIP (SYRINGE) ×2 IMPLANT
SYR 3ML LL SCALE MARK (SYRINGE) ×10 IMPLANT
SYR BULB IRRIG 60ML STRL (SYRINGE) ×2 IMPLANT

## 2015-07-29 SURGICAL SUPPLY — 13 items
BALLN ARMADA 8X40X80 (BALLOONS) ×4
BALLOON ARMADA 8X40X80 (BALLOONS) ×1 IMPLANT
CATH TORCON 5FR 0.38 (CATHETERS) ×3 IMPLANT
DEVICE PRESTO INFLATION (MISCELLANEOUS) ×2 IMPLANT
DEVICE SOLENT PROXI 90CM (CATHETERS) ×2 IMPLANT
DEVICE STARCLOSE SE CLOSURE (Vascular Products) ×6 IMPLANT
GLIDEWIRE ADV .035X260CM (WIRE) ×8 IMPLANT
PACK ANGIOGRAPHY (CUSTOM PROCEDURE TRAY) ×4 IMPLANT
SHEATH BRITE TIP 6FRX11 (SHEATH) ×4 IMPLANT
SHEATH BRITE TIP 7FRX11 (SHEATH) ×3 IMPLANT
STENT VIABAHN VBX 8X59X135 (Permanent Stent) ×6 IMPLANT
WIRE MAGIC TOR.035 180C (WIRE) ×3 IMPLANT
WIRE MAGIC TORQUE 260C (WIRE) ×6 IMPLANT

## 2015-07-29 NOTE — Op Note (Signed)
[facility]   PROCEDURE NOTE  PROCEDURE: 1. Right IJ TLC central venous catheter placement 2. Right internal jugular cannulation under ultrasound guidance  PRE-OPERATIVE DIAGNOSIS: embolus to BLE, ischemic legs  POST-OPERATIVE DIAGNOSIS: same as above  SURGEON: DEW,JASON, MD  ANESTHESIA:  None  ESTIMATED BLOOD LOSS: minimal  FINDING(S): none  SPECIMEN(S):  none  INDICATIONS:   [name] is a 82 y.o. female who presents with need for venous access.  The patient presents for central venous catheter placement.  The patient is aware the risks of central venous catheter placement include but are not limited to: bleeding, infection, central venous injury, pneumothorax, possible venous stenosis, possible malpositioning in the venous system, and possible infections related to long-term catheter presence. The patient was aware of these risks and agreed to proceed.  DESCRIPTION: After written informed consent was obtained from the patient and/or family, the patient was placed supine in the hospital bed.  The patient was prepped with chloraprep and draped in the standard fashion for a chest or neck central venous catheter placement.  I anesthesized the neck cannulation site with 1% lidocaine then under ultrasound guidance, the right internal jugular vein was cannulated with the 18 gauge needle.  A J wire was then placed down in the superior vena cava.  After a skin nick and dilatation, the triple lumen central venous catheter was placed over the wire and the wire was removed.  Each port was aspirated and flushed with sterile normal saline.  The catheter was secured in placed with three interrupted stitches of 3-0 [place] tied to the catheter.  The catheter was dressed with sterile dressing.  Fluoro confirmed the catheter in the SVC.  COMPLICATIONS: none apparent  CONDITION: stable  DEW,JASON 07/29/2015, 10:43 AM

## 2015-07-29 NOTE — Progress Notes (Signed)
Slightly blood tinged urine noted in foley catheter.

## 2015-07-29 NOTE — Op Note (Signed)
[facility]  Percutaneous Study/Intervention Procedural Note   Date of Surgery: 07/29/2015,9:43 AM  Surgeon:Schnier, Dolores Lory   Pre-operative Diagnosis: Ischemic bilateral lower extremities; arterial embolization bilateral lower extremities; atrial fibrillation  Post-operative diagnosis:  Same  Procedure(s) Performed:  1.  Introduction catheter into aorta right common femoral artery approach  2.  Introduction catheter into aorta left common femoral artery approach  3.  Initiation of arterial thrombolysis  4.  Abdominal aortogram  5.  Bilateral lower extremity distal runoff   Anesthesia: Conscious sedation was administered under my direct supervision. IV Versed plus fentanyl were utilized. Continuous ECG, pulse oximetry and blood pressure was monitored throughout the entire procedure. A total of 3 milligrams of Versed and 100 micrograms of fentanyl were utilized.  Conscious sedation was administered for a total of 70 minutes.  Sheath: 1.  5 French sheath retrograde direction right common femoral artery                2.  5 French sheath retrograde direction left common femoral artery  Contrast: 60 cc   Fluoroscopy Time: 2.8 minutes  Indications:  Patient presented to the emergency room with bilateral lower extremity pain left greater than right pulses were not palpable Doppler signals were faint. She has a history of atrial fibrillation. She does not have a history of atherosclerotic occlusive disease no past interventions angiography or nonhealing wounds. She is therefore undergoing diagnostic angiography with the hope for intervention. The risks and benefits of review of been reviewed in detail all questions of been answered patient agrees to proceed family is aware daughter is present at these discussions.  Procedure:  Kadiedra Jarzabek Harrisonis a 82 y.o. female who was identified and appropriate procedural time out was performed.  The patient was then placed supine on  the table and prepped and draped in the usual sterile fashion.  Ultrasound was used to evaluate the right common femoral artery.  It was patent .  A digital ultrasound image was acquired.  Amicropuncture needle was used to access the right common femoral artery under direct ultrasound guidance and a permanent image wassaved for the record.microwire was then advanced under fluoroscopic guidance followed by micro-sheath.  A 0.035 J wire was advanced without resistance and a 5Fr sheath was placed.    Pigtail catheter was then advanced to the level of T12 and AP projection of the aorta was obtained. Pigtail catheter was then repositioned to above the bifurcation and bilateral oblique views of the pelvis were obtained. The detector was then positioned AP and bilateral distal runoff was obtained distal to the trifurcation. After review these images it is quite apparent there is an embolization likely from her atrial fibrillation which is lodged within the distal aorta more prominently so in the left common iliac artery which is occluded but a near occlusion of the right common iliac artery is noted as well and therefore thrombolysis will be initiated.  Ultrasound was used to evaluate the left common femoral artery.  It was patent.  A digital ultrasound image was acquired.  A micropuncture needle was used to access the left common femoral artery under direct ultrasound guidance and a permanent image was saved for the record.microwire was then advanced under fluoroscopic guidance followed by micro-sheath.  A 0.035 J wire was advanced without resistance and a 5Fr sheath was placed. Catheter was then advanced over the wire and hand injection contrast used to map the left iliac occlusion by hand injection of the aorta.  2 infusion catheters with 10 cm infusion length were then selected and advanced up the right and left sheaths. They were positioned with the beginning of the infusion length 1 cm into the aorta extending  down into the iliacs bilaterally. The entire length of thrombus was covered on both sides. Catheters were then secured to the thigh with stitches. Thrombolysis is then initiated.  Findings:   Aortogram:  The aorta is patent without evidence of atherosclerotic changes down to the bifurcation where thrombus is identified extending into both right and left common iliac arteries.  Right Lower Extremity:  There is near occlusion of the common iliac on the right. Internal and external iliac arteries as well as the common femoral profunda femoris SFA and popliteal widely patent. Trifurcation is patent.  Left Lower Extremity:  There is occlusion of the entire common iliac on the left. Internal and external iliac arteries as well as the common femoral profunda femoris SFA and popliteal widely patent. Trifurcation is patent.   Disposition: Patient was taken to the recovery room in stable condition having tolerated the procedure well.  Schnier, Dolores Lory 07/29/2015,9:43 AM

## 2015-07-29 NOTE — Progress Notes (Signed)
Blood bank called, they will only set up 2 units at this time.  Because the patients Hemoglobin is 14.

## 2015-07-29 NOTE — Progress Notes (Signed)
Pt clinically stable post leg angiogram, with bilateral groin arterial lines with tpa infusing in each access, also on heparin gtt at 27ml/hr. Vss, pt alert and oriented, daughter and son with patient, have spoken with Dr Delana Meyer. Report given to Rochelle with ccu 4, orders and plan reviewed.

## 2015-07-29 NOTE — Anesthesia Postprocedure Evaluation (Signed)
Anesthesia Post Note  Patient: [name]  Procedure(s) Performed: Procedure(s) (LRB): EMBOLECTOMY/ Left Femoral (Left)  Patient location during evaluation: SICU Anesthesia Type: General Level of consciousness: patient remains intubated per anesthesia plan Pain management: pain level controlled Vital Signs Assessment: post-procedure vital signs reviewed and stable Respiratory status: patient remains intubated per anesthesia plan and patient on ventilator - see flowsheet for VS Cardiovascular status: stable Anesthetic complications: no    Last Vitals:  Filed Vitals:   07/29/15 1730 07/29/15 1740  BP: 131/81 130/78  Pulse: 94 93  Temp:    Resp: 15 16    Last Pain:  Filed Vitals:   07/29/15 1742  PainSc: Asleep                 VAN STAVEREN,Avondre Richens

## 2015-07-29 NOTE — Progress Notes (Signed)
ANTICOAGULATION CONSULT NOTE - Initial Consult  Pharmacy Consult for Heparin Indication:Ischemic leg  Allergies  Allergen Reactions  . Betadine [Povidone Iodine] Hives  . Sulfa Antibiotics   . Valium [Diazepam] Anxiety    Patient Measurements: Height: 5' (152.4 cm) Weight: 201 lb (91.173 kg) IBW/kg (Calculated) : 45.5  Vital Signs: Temp: 98.8 F (37.1 C) (04/03 0712) Temp Source: Oral (04/03 0712) BP: 124/85 mmHg (04/03 1246) Pulse Rate: 105 (04/03 1246)  Labs:  Recent Labs  07/28/15 1316 07/28/15 1514 07/29/15 0432  HGB 13.4  --  12.3  HCT 40.1  --  36.2  PLT 181  --  167  APTT  --  37*  --   LABPROT  --  15.0  --   INR  --  1.16  --   HEPARINUNFRC  --   --  1.26*  CREATININE 1.40*  --  1.24*  CKTOTAL 32*  --   --     Estimated Creatinine Clearance: 34 mL/min (by C-G formula based on Cr of 1.24).   Medical History: Past Medical History  Diagnosis Date  . Hypertension   . Thyroid disorder   . Lymphoma ([place])   . Hypothyroidism   . Arthritis   . Diverticulosis   . Obstructive sleep apnea   . Myasthenia gravis ([place])   . Hip fracture, left ([place])   . Fracture of right humerus   . Asthma     Medications:  Scheduled:  . atenolol  25 mg Oral Daily  . benazepril  20 mg Oral Daily   And  . hydrochlorothiazide  12.5 mg Oral Daily  . calcium-vitamin D  1 tablet Oral BID  . ferrous sulfate  325 mg Oral BID WC  . fesoterodine  4 mg Oral Daily  . heparin      . heparin      . levothyroxine  75 mcg Oral QAC breakfast  . polyethylene glycol  17 g Oral Daily  . QUEtiapine  25 mg Oral TID  . sodium bicarbonate (CIN) infusion   Intravenous to XRAY  . sodium chloride flush  3 mL Intravenous Q12H  . sodium chloride flush  3 mL Intravenous Q12H   Infusions:  . sodium chloride 75 mL/hr at 07/29/15 0715  . alteplase (TPA-ACTIVASE) *DIALYSIS CATH* 10 mg infusion 10 mg (07/29/15 0930)  . alteplase (TPA-ACTIVASE) *DIALYSIS CATH* 5 mg infusion 5 mg (07/29/15 0943)   . heparin 400 Units/hr (07/29/15 1225)    Assessment: 82 y/o F s/p vascular procedure for ischemic leg on alteplase and heparin infusion. Per Dr. Delana Meyer, do not titrate heparin infusion at this time. May need to titrate after patient returns to vascular lab.   Goal of Therapy:   Monitor platelets by anticoagulation protocol: Yes   Plan:  Will f/u plans for heparin.   Ulice Dash D 07/29/2015,1:36 PM

## 2015-07-29 NOTE — Transfer of Care (Signed)
Immediate Anesthesia Transfer of Care Note  Patient: [name]  Procedure(s) Performed: Procedure(s): EMBOLECTOMY/ Left Femoral (Left)  Patient Location: PACU and ICU  Anesthesia Type:General  Level of Consciousness: sedated and Patient remains intubated per anesthesia plan  Airway & Oxygen Therapy: Patient placed on Ventilator (see vital sign flow sheet for setting)  Post-op Assessment: Report given to RN and Post -op Vital signs reviewed and stable  Post vital signs: stable  Last Vitals:  Filed Vitals:   07/29/15 1730 07/29/15 1740  BP: 131/81 130/78  Pulse: 94 93  Temp:    Resp: 15 16    Complications: No apparent anesthesia complications

## 2015-07-29 NOTE — OR Nursing (Addendum)
Dr. Delana Meyer notified Hgb 11.2 at 2032.

## 2015-07-29 NOTE — Op Note (Signed)
    OPERATIVE NOTE   PROCEDURE: Thromboembolectomy left external iliac artery, common femoral artery, profunda femoris artery and superficial femoral artery  PRE-OPERATIVE DIAGNOSIS: Thromboembolus left iliofemoral system; ischemic lower extremity  POST-OPERATIVE DIAGNOSIS: Same  SURGEON: Schnier, Dolores Lory  ASSISTANT(S): None  ANESTHESIA: general  ESTIMATED BLOOD LOSS: 250 cc  FINDING(S): Thrombus noted within the external iliac common femoral profunda femoris and superficial femoral arteries  SPECIMEN(S):  Thrombotic material retrieved from the artery  INDICATIONS:   [name] is a 82 y.o. female who presents with ischemia bilateral lower extremities. She underwent 6 hours of lytic therapy and subsequent intervention which produced an excellent result on the right and a significant improvement on the left limb eliminating all thrombus from the aorta and common iliac on the left. Final angiography demonstrated significant thrombus throughout the distal external iliac and common femoral artery as well as extending into the profunda femoris and superficial femoral..  DESCRIPTION: After full informed written consent was obtained from the patient, the patient was brought back to the operating room and placed supine upon the operating table.  Prior to induction, the patient received IV antibiotics.   After obtaining adequate anesthesia, the patient was then prepped and draped in the standard fashion for a  left femoral embolectomy.  Vertical incision is then created and the dissection is carried down through the soft tissues to expose the common femoral artery. Initially it was actually the superficial femoral artery and the dissection was carried more proximally.  The ileo-inguinal ligament was identified and then transected and the dissection carried more proximally ultimately demonstrating that the femoral bifurcation occurred above the ileo-inguinal ligament. The SFA and then  the profunda femoris were identified and looped with Silastic vessel loops. Circumflex branch medially was also looped with a blue Silastic vessel loop. Ultimately the external iliac artery was dissected circumferentially and a Silastic vessel loop was placed.  3000 units of heparin was given and allowed circulate for several minutes. She also be noted that the patient was maintained on a heparin drip until this bolus was given at which point the heparin drip was turned off.  Transverse arteriotomy was made several millimeters above the bifurcation of the profunda femoris and SFA a #4 Fogarty was then passed into the external iliac with retrieval of a plug of thrombus and brisk outflow bleeding. A #3 Fogarty was then passed individually down the SFA and then the profunda femoris. A total of 3 passes were made proximally and 2 passes were made each into the profunda femoris and SFA. All 3 arteries were then irrigated with heparinized saline and the arteriotomy was closed with interrupted 6-0 Prolene sutures. Flushing maneuvers were performed before the suture line was completed. Flow was then reestablished first to the profunda femoris and then the SFA. Palpable pulses were noted in both arteries 2 cm distal to the arteriotomy.  The wound was then irrigated with a liter saline fibrillar was placed along the suture line and the incision was subsequently closed with multiple layers of 20 and 3-0 Vicryl in a running fashion. The skin was loosely approximated with interrupted 4-0 nylon sutures and a VAC dressing was applied.    The patient tolerated this procedure well.   COMPLICATIONS: None  CONDITION: [name]  Office: [phone]   07/29/2015, 9:53 PM

## 2015-07-29 NOTE — Anesthesia Preprocedure Evaluation (Addendum)
Anesthesia Evaluation  Patient identified by MRN, date of birth, ID band Patient confused    Reviewed: Allergy & Precautions, NPO status   History of Anesthesia Complications (+) history of anesthetic complications  Airway Mallampati: III       Dental  (+) Partial Upper   Pulmonary asthma , sleep apnea ,     + decreased breath sounds      Cardiovascular Exercise Tolerance: Poor hypertension, Pt. on home beta blockers + dysrhythmias Atrial Fibrillation  Rhythm:Irregular Rate:Tachycardia     Neuro/Psych Myastenia Gravis    GI/Hepatic negative GI ROS,   Endo/Other  Hypothyroidism Morbid obesity  Renal/GU negative Renal ROS     Musculoskeletal   Abdominal (+) + obese,   Peds  Hematology   Anesthesia Other Findings   Reproductive/Obstetrics                            Anesthesia Physical Anesthesia Plan  ASA: IV and emergent  Anesthesia Plan: General   Post-op Pain Management:    Induction: Intravenous  Airway Management Planned: Oral ETT  Additional Equipment:   Intra-op Plan:   Post-operative Plan: Extubation in OR  Informed Consent: I have reviewed the patients History and Physical, chart, labs and discussed the procedure including the risks, benefits and alternatives for the proposed anesthesia with the patient or authorized representative who has indicated his/her understanding and acceptance.     Plan Discussed with: CRNA  Anesthesia Plan Comments:        Anesthesia Quick Evaluation

## 2015-07-29 NOTE — Progress Notes (Signed)
eLink Physician-Brief Progress Note Patient Name: [name] DOB: 21-Oct-1929 MRN: [id number]   Date of Service  07/29/2015  HPI/Events of Note  p-embolectomy AF -rate controlled, not on AC prior myasthenia  eICU Interventions  Hold PO meds Vent/ sedation orders given- fent gtt/ versed prn pCXR BMET in am, bicarb gtt D/w vascular      Intervention Category Evaluation Type: New Patient Evaluation  [name] V. 07/29/2015, 10:19 PM

## 2015-07-29 NOTE — Consult Note (Addendum)
[facility] [place] NAME: [name]    MR#:  [id number]  DATE OF BIRTH:  22-Feb-1930  DATE OF ADMISSION:  07/28/2015  PRIMARY CARE PHYSICIAN: Sarajane Jews, MD   REQUESTING/REFERRING PHYSICIAN: Drs.Dew/Schnier  CHIEF COMPLAINT:   Chief Complaint  Patient presents with  . Urinary Tract Infection  . Weakness  . Hip Pain    HISTORY OF PRESENT ILLNESS:  [name]  is a 82 y.o. female with a known history of paroxysmal atrial fibrillation, for which she did not want to have workup done, who was not on anticoagulation, who presents to the hospital with complaints of increasing lower extremity pain, cool feet. Unfortunately, patient herself is not able to provide much of history or review of systems due to postoprocedural  confusion, however, according to medical records, the patient was admitted to the hospital because of worsening pain over several days, worse whenever she stands up or sits up, associated with nails and toes turning dark blue while sitting. Workup revealed bilateral lower extremity embolization due to atrial fibrillation, patient was initiated on TPA via bilateral groin arterial lines as well as heparin intravenously, thrombectomy was performed. Consultation was requested in regards to atrial fibrillation therapy, evaluation.   PAST MEDICAL HISTORY:   Past Medical History  Diagnosis Date  . Hypertension   . Thyroid disorder   . Lymphoma ([place])   . Hypothyroidism   . Arthritis   . Diverticulosis   . Obstructive sleep apnea   . Myasthenia gravis ([place])   . Hip fracture, left ([place])   . Fracture of right humerus   . Asthma     PAST SURGICAL HISTOIRY:   Past Surgical History  Procedure Laterality Date  . Nm pet lymphoma    . Hernia repair    . Cholecystectomy    . Back surgery      SOCIAL HISTORY:   Social History  Substance Use Topics  . Smoking status: Never Smoker   . Smokeless tobacco: Never Used   . Alcohol Use: No    FAMILY HISTORY:   Family History  Problem Relation Age of Onset  . Heart Problems Mother   . Cancer Father     DRUG ALLERGIES:   Allergies  Allergen Reactions  . Betadine [Povidone Iodine] Hives  . Sulfa Antibiotics   . Valium [Diazepam] Anxiety    REVIEW OF SYSTEMS:  Unable to evaluate since patient is confused, denies any pain  MEDICATIONS AT HOME:   Prior to Admission medications   Medication Sig Start Date End Date Taking? Authorizing Provider  acetaminophen (TYLENOL) 325 MG tablet Take 650 mg by mouth every 4 (four) hours as needed for mild pain.    Historical Provider, MD  albuterol (PROVENTIL HFA;VENTOLIN HFA) 108 (90 BASE) MCG/ACT inhaler Inhale 2 puffs into the lungs every 6 (six) hours as needed for shortness of breath.    Historical Provider, MD  aspirin EC 81 MG tablet Take 81 mg by mouth daily.    Historical Provider, MD  atenolol (TENORMIN) 25 MG tablet Take 25 mg by mouth daily.  12/05/12   Historical Provider, MD  benazepril-hydrochlorthiazide (LOTENSIN HCT) 20-12.5 MG per tablet Take 1 tablet by mouth daily.  11/05/12   Historical Provider, MD  calcium-vitamin D (OSCAL WITH D) 500-200 MG-UNIT per tablet Take 1 tablet by mouth 2 (two) times daily.    Historical Provider, MD  carboxymethylcellulose (REFRESH PLUS) 0.5 % SOLN Place 1 drop into both  eyes as needed.    Historical Provider, MD  clotrimazole-betamethasone (LOTRISONE) cream as needed.  09/01/12   Historical Provider, MD  docusate sodium (COLACE) 100 MG capsule Take 100 mg by mouth 2 (two) times daily.    Historical Provider, MD  feeding supplement, ENSURE ENLIVE, (ENSURE ENLIVE) LIQD Take 237 mLs by mouth 2 (two) times daily between meals. 09/20/14   Christy Mango, MD  ferrous sulfate 325 (65 FE) MG tablet Take 325 mg by mouth 2 (two) times daily with a meal.    Historical Provider, MD  levothyroxine (SYNTHROID, LEVOTHROID) 75 MCG tablet Take 75 mcg by mouth daily.  12/05/12   Historical  Provider, MD  lidocaine (XYLOCAINE) 5 % ointment Apply 1 application topically every 4 (four) hours as needed (rectal pain).    Historical Provider, MD  menthol-cetylpyridinium (CEPACOL) 3 MG lozenge Take 1 lozenge by mouth as needed for sore throat.    Historical Provider, MD  Multiple Vitamins-Minerals (MULTIVITAMIN WITH MINERALS) tablet Take 1 tablet by mouth daily.    Historical Provider, MD  oxyCODONE (OXY IR/ROXICODONE) 5 MG immediate release tablet Take 1 tablet (5 mg total) by mouth 2 (two) times daily as needed for moderate pain or severe pain. 09/20/14   Christy Mango, MD  polyethylene glycol (MIRALAX / GLYCOLAX) packet Take 17 g by mouth daily.    Historical Provider, MD  QUEtiapine (SEROQUEL) 25 MG tablet Take 1 tablet (25 mg total) by mouth 3 (three) times daily. 09/20/14   Christy Mango, MD  tolterodine (DETROL LA) 4 MG 24 hr capsule Take 4 mg by mouth daily.    Historical Provider, MD      VITAL SIGNS:  Blood pressure 130/78, pulse 93, temperature 98.8 F (37.1 C), temperature source Oral, resp. rate 16, height 5' (1.524 m), weight 91.173 kg (201 lb), SpO2 98 %.  PHYSICAL EXAMINATION:  GENERAL:  82 y.o.-year-old patient lying in the bed with no acute distress. Lying on the bed, opens her eyes with verbal simulation, confused and not able to answer questions appropriately, does not know where she is, is not sure if she has any pain EYES: Pupils equal, round, reactive to light and accommodation. No scleral icterus. Extraocular muscles intact.  HEENT: Head atraumatic, normocephalic. Oropharynx and nasopharynx clear.  NECK:  Supple, no jugular venous distention. No thyroid enlargement, no tenderness.  LUNGS: Normal breath sounds bilaterally, anteriorly, no wheezing, rales,rhonchi or crepitation. No use of accessory muscles of respiration.  CARDIOVASCULAR: S1, S2 , regular, with intermittent irregularities. No murmurs, rubs, or gallops.  ABDOMEN: Soft, nontender, nondistended. Bowel sounds  present. No organomegaly or mass.  EXTREMITIES: No pedal edema, cyanosis, or clubbing. Feet are cool but not cold, patient is able to move her. Her toes, dorsalis pedis pulses are not palpable, some swelling was noted of left lower extremity NEUROLOGIC: Cranial nerves II through XII are intact. Muscle strength 5/5 in all extremities. Sensation intact. Gait not checked.  PSYCHIATRIC: The patient is alert but disoriented  SKIN: No obvious rash, lesion, or ulcer.   LABORATORY PANEL:   CBC  Recent Labs Lab 07/29/15 1308  WBC 8.9  HGB 14.5  HCT 43.9  PLT 213   ------------------------------------------------------------------------------------------------------------------  Chemistries   Recent Labs Lab 07/29/15 0432  NA 136  K 4.0  CL 101  CO2 31  GLUCOSE 149*  BUN 29*  CREATININE 1.24*  CALCIUM 8.7*   ------------------------------------------------------------------------------------------------------------------  Cardiac Enzymes No results for input(s): TROPONINI in the last 168 hours. ------------------------------------------------------------------------------------------------------------------  RADIOLOGY:  Dg Lumbar Spine 2-3 Views  07/28/2015  CLINICAL DATA:  Low back and left hip pain.  No time course given. EXAM: LUMBAR SPINE - 2-3 VIEW; DG HIP (WITH OR WITHOUT PELVIS) 2-3V LEFT COMPARISON:  CT scan 09/19/2014 FINDINGS: Lumbar spine: Left convex lumbar scoliosis and degenerative lumbar spondylosis. Stable vertebral augmentation changes at L2. There is also a remote compression fracture of L1. No acute bony findings. Stable advanced facet disease. No obvious pars defects. Stable aortic and iliac artery calcifications. The visualized bony pelvis is intact. Pelvis/left hip: Both hips are normally located. Moderate degenerative changes bilaterally. The pubic symphysis and SI joints are intact. No pelvic fractures. Remote intra medullary rod and proximal dynamic hip screw  transfixing a remote intertrochanteric fracture. No acute bony findings. The hardware is intact. IMPRESSION: 1. Remote lumbar compression fractures. No acute abnormality. Stable scoliosis and degenerative lumbar spondylosis. 2. Intact left hip hardware.  No acute hip or pelvic fracture. Electronically Signed   By: Marijo Sanes M.D.   On: 07/28/2015 16:25   US Venous Img Lower Bilateral  07/28/2015  CLINICAL DATA:  Bilateral leg pain EXAM: BILATERAL LOWER EXTREMITY VENOUS DOPPLER ULTRASOUND TECHNIQUE: Gray-scale sonography with graded compression, as well as color Doppler and duplex ultrasound were performed to evaluate the lower extremity deep venous systems from the level of the common femoral vein and including the common femoral, femoral, profunda femoral, popliteal and calf veins including the posterior tibial, peroneal and gastrocnemius veins when visible. The superficial great saphenous vein was also interrogated. Spectral Doppler was utilized to evaluate flow at rest and with distal augmentation maneuvers in the common femoral, femoral and popliteal veins. COMPARISON:  None. FINDINGS: RIGHT LOWER EXTREMITY Common Femoral Vein: No evidence of thrombus. Normal compressibility, respiratory phasicity and response to augmentation. Saphenofemoral Junction: No evidence of thrombus. Normal compressibility and flow on color Doppler imaging. Profunda Femoral Vein: No evidence of thrombus. Normal compressibility and flow on color Doppler imaging. Femoral Vein: No evidence of thrombus. Normal compressibility, respiratory phasicity and response to augmentation. Popliteal Vein: No evidence of thrombus. Normal compressibility, respiratory phasicity and response to augmentation. Calf Veins: No evidence of thrombus. Normal compressibility and flow on color Doppler imaging. Superficial Great Saphenous Vein: No evidence of thrombus. Normal compressibility and flow on color Doppler imaging. Venous Reflux:  None. Other  Findings:  None. LEFT LOWER EXTREMITY Common Femoral Vein: No evidence of thrombus. Normal compressibility, respiratory phasicity and response to augmentation. Saphenofemoral Junction: No evidence of thrombus. Normal compressibility and flow on color Doppler imaging. Profunda Femoral Vein: No evidence of thrombus. Normal compressibility and flow on color Doppler imaging. Femoral Vein: No evidence of thrombus. Normal compressibility, respiratory phasicity and response to augmentation. Popliteal Vein: No evidence of thrombus. Normal compressibility, respiratory phasicity and response to augmentation. Calf Veins: No evidence of thrombus. Normal compressibility and flow on color Doppler imaging. Superficial Great Saphenous Vein: No evidence of thrombus. Normal compressibility and flow on color Doppler imaging. Venous Reflux:  None. Other Findings:  None. IMPRESSION: No evidence of deep venous thrombosis. Electronically Signed   By: Inez Catalina M.D.   On: 07/28/2015 19:11   Dg Hip Unilat With Pelvis 2-3 Views Left  07/28/2015  CLINICAL DATA:  Low back and left hip pain.  No time course given. EXAM: LUMBAR SPINE - 2-3 VIEW; DG HIP (WITH OR WITHOUT PELVIS) 2-3V LEFT COMPARISON:  CT scan 09/19/2014 FINDINGS: Lumbar spine: Left convex lumbar scoliosis and degenerative lumbar spondylosis. Stable vertebral augmentation changes at L2. There  is also a remote compression fracture of L1. No acute bony findings. Stable advanced facet disease. No obvious pars defects. Stable aortic and iliac artery calcifications. The visualized bony pelvis is intact. Pelvis/left hip: Both hips are normally located. Moderate degenerative changes bilaterally. The pubic symphysis and SI joints are intact. No pelvic fractures. Remote intra medullary rod and proximal dynamic hip screw transfixing a remote intertrochanteric fracture. No acute bony findings. The hardware is intact. IMPRESSION: 1. Remote lumbar compression fractures. No acute  abnormality. Stable scoliosis and degenerative lumbar spondylosis. 2. Intact left hip hardware.  No acute hip or pelvic fracture. Electronically Signed   By: Marijo Sanes M.D.   On: 07/28/2015 16:25    EKG:   Orders placed or performed during the hospital encounter of 07/28/15  . ED EKG  . ED EKG  . EKG 12-Lead  . EKG 12-Lead  EKG on admission reveals sinus rhythm with premature atrial, [place] at 76 bpm, normal axis, low voltage QRS, questionable anterior infarct with poor R-wave progression in V3, age indeterminant, T depression in high lateral leads  IMPRESSION AND PLAN:    Active Problems:   Ischemic leg #1. Atrial fibrillation, paroxysmal, continue atenolol, patient will need to be on anticoagulation, possibly with Eliquis, as outpatient. Get echocardiogram, may need cardiologist consultation #2. Hypotension, discontinue but has a bill, HCTZ for now. Continue atenolol, resume home medications when blood pressure improves #3, hematuria, likely due to anticoagulation the patient is receiving at present, follow hemoglobin level every 8 hours and transfuse patient as needed #4. Chronic renal insufficiency, follow up with multiple procedures, continue bicarbonate drip, IV fluids, discontinue ACE inhibitor at present due to relative hypotension   All the records are reviewed and case discussed with Consulting provider. Management plans discussed with the patient, family and they are in agreement.  CODE STATUS: Full code  TOTAL TIME TAKING CARE OF THIS PATIENT: 55 minutes.    Theodoro Grist M.D on 07/29/2015 at 6:51 PM  Between 7am to 6pm - Pager - [phone]  After 6pm go to [email or web address] - password [email or web address] [facility]  Office  [phone]  CC: Primary care Physician: Sarajane Jews, MD

## 2015-07-29 NOTE — OR Nursing (Signed)
63ml Air removed from pressure port on right vascular site, per Dr. Delana Meyer verbal order

## 2015-07-29 NOTE — Anesthesia Procedure Notes (Signed)
Procedure Name: Intubation Date/Time: 07/29/2015 7:31 PM Performed by: Lendon Colonel Pre-anesthesia Checklist: Emergency Drugs available, Patient identified, Suction available, Patient being monitored and Timeout performed Patient Re-evaluated:Patient Re-evaluated prior to inductionOxygen Delivery Method: Circle system utilized Preoxygenation: Pre-oxygenation with 100% oxygen Intubation Type: IV induction Ventilation: Mask ventilation without difficulty Laryngoscope Size: Miller and 2 Grade View: Grade II Tube type: Oral Tube size: 7.0 mm Number of attempts: 1 Airway Equipment and Method: Stylet Placement Confirmation: ETT inserted through vocal cords under direct vision,  positive ETCO2 and breath sounds checked- equal and bilateral Secured at: 20 cm Tube secured with: Tape Dental Injury: Teeth and Oropharynx as per pre-operative assessment

## 2015-07-29 NOTE — Op Note (Signed)
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 Second look angiography with aortogram and bilateral iliofemoral angiograms 2. Catheter placement into aorta from bilateral femoral approaches 3. Mechanical rheolytic thrombectomy of the aorta, left iliac artery, and left common femoral artery 4. Bilateral iliac artery stent placements to reconstruct the distal aorta and proximal iliac arteries in a kissing balloon fashion using 28 mm diameter by 59 mm length VBX stents 5. StarClose closure device bilateral femoral arteries  EBL: 25 cc  Contrast: 60 cc  Fluoro Time: 4.1 minutes  Moderate Conscious Sedation Time: approximately 70 minutes using 3 mg of Versed and 100 mcg of Fentanyl  Indications: Patient is a 82 y.o.female with acute aortic occlusion and thrombosis of bilateral iliac arteries worse on the left than the right. She was brought in earlier in the day and lysis was started and has been run on TPA for about 6-7 hours now. She still has signs of lower extremity ischemia. The patient is brought in for angiography for further evaluation and potential treatment. Risks and benefits are discussed and informed consent is obtained  Procedure: The patient was identified and appropriate procedural time out was performed. The patient was then placed supine on the table and prepped and draped in the usual sterile fashion.Moderate conscious sedation was administered during a face to face encounter with the patient throughout the procedure with my supervision of the RN  administering medicines and monitoring the patient's vital signs, pulse oximetry, telemetry and mental status throughout from the start of the procedure until the patient was taken to the recovery room.  Her existing lysis catheters were removed and Magic torque wires were placed bilaterally. Kumpe catheter was placed into the aorta from a right femoral approach and an AP aortogram was performed. This demonstrated a distal aortic thrombus with partial encroachment on the right common iliac artery and near complete occlusion of the left common iliac artery area further down, the right femoral artery appeared patent but there was a large blob of thrombus in the left common femoral artery and into the initial portions of the SFA and profunda femoris artery on the left. With a balloon inflated in the right common iliac artery to protect this, I performed mechanical rheolytic thrombectomy to address the distal aortic thrombus, left iliac thrombus, and the thrombus in the left common femoral artery as much as possible through the left femoral access. The AngioJet Omni catheter was used and approximately 88 cc of effluent was returned. This resulted in mild improvement in the distal aortic thrombus and common iliac arteries, as well as mild improvement in the left common femoral artery thrombus but significant flow-limiting thrombus remained in both locations. We upsized to 7 [place] sheaths. I elected to reconstruct the distal aorta and treat the iliac artery thrombosis with covered kissing balloon stents. 9m diameter by 59 mm length VBX stents were selected on each side and deployed reconstructing the distal aorta and encompassing the thrombosis in this location. The balloons were inflated to 14 atm. This resulted in clearance or coverage of the distal aortic and common iliac artery thrombosis/embolus with good flow in the aorta and bilateral iliac arteries. Imaging through the right femoral sheath showed the side to  be clear into the proximal  superficial femoral artery. Imaging through the left femoral sheath showed the persistent thrombosis and embolus in the left common femoral artery occluding the origins of both the profunda femoris artery and SFA. At this point, it was felt that open surgical therapy would be required for the left common femoral artery embolus but that we had markedly improved her inflow. I elected to terminate the procedure. The sheaths were removed and StarClose closure device was deployed in both femoral arteries with excellent hemostatic result. The patient was taken to the recovery room in stable condition having tolerated the procedure well.  Findings:  Aortogram: demonstrated a distal aortic thrombus with partial encroachment on the right common iliac artery and near complete occlusion of the left common iliac artery area further down, the right femoral artery appeared patent but there was a large blob of thrombus in the left common femoral artery and into the initial portions of the SFA and profunda femoris artery on the left.    Disposition: Patient was taken to the recovery room in stable condition having tolerated the procedure well.  Complications: None  DEW,JASON 07/29/2015 4:42 PM

## 2015-07-30 ENCOUNTER — Encounter: Payer: Self-pay | Admitting: Vascular Surgery

## 2015-07-30 ENCOUNTER — Inpatient Hospital Stay: Payer: PPO

## 2015-07-30 ENCOUNTER — Inpatient Hospital Stay: Admit: 2015-07-30 | Payer: PPO

## 2015-07-30 DIAGNOSIS — R571 Hypovolemic shock: Secondary | ICD-10-CM

## 2015-07-30 DIAGNOSIS — I998 Other disorder of circulatory system: Secondary | ICD-10-CM

## 2015-07-30 LAB — TYPE AND SCREEN
ABO/RH(D): A POS
ANTIBODY SCREEN: NEGATIVE
UNIT DIVISION: 0
UNIT DIVISION: 0

## 2015-07-30 LAB — CBC
HCT: 32 % — ABNORMAL LOW (ref 35.0–47.0)
HEMOGLOBIN: 10.8 g/dL — AB (ref 12.0–16.0)
MCH: 30.4 pg (ref 26.0–34.0)
MCHC: 33.7 g/dL (ref 32.0–36.0)
MCV: 90.1 fL (ref 80.0–100.0)
PLATELETS: 195 10*3/uL (ref 150–440)
RBC: 3.56 MIL/uL — AB (ref 3.80–5.20)
RDW: 14.5 % (ref 11.5–14.5)
WBC: 15.8 10*3/uL — ABNORMAL HIGH (ref 3.6–11.0)

## 2015-07-30 LAB — LACTIC ACID, PLASMA: Lactic Acid, Venous: 1.7 mmol/L (ref 0.5–2.0)

## 2015-07-30 LAB — TROPONIN I: TROPONIN I: 2.44 ng/mL — AB (ref <0.031)

## 2015-07-30 LAB — APTT: aPTT: 46 seconds — ABNORMAL HIGH (ref 24–36)

## 2015-07-30 LAB — PREPARE RBC (CROSSMATCH)

## 2015-07-30 LAB — CK: CK TOTAL: 215 U/L (ref 38–234)

## 2015-07-30 LAB — CORTISOL: Cortisol, Plasma: 42.9 ug/dL

## 2015-07-30 MED ORDER — MUPIROCIN 2 % EX OINT
1.0000 "application " | TOPICAL_OINTMENT | Freq: Two times a day (BID) | CUTANEOUS | Status: AC
  Administered 2015-07-30 – 2015-08-03 (×10): 1 via NASAL
  Filled 2015-07-30: qty 22

## 2015-07-30 MED ORDER — PIPERACILLIN-TAZOBACTAM 3.375 G IVPB
3.3750 g | Freq: Three times a day (TID) | INTRAVENOUS | Status: DC
  Administered 2015-07-30 – 2015-08-05 (×19): 3.375 g via INTRAVENOUS
  Filled 2015-07-30 (×23): qty 50

## 2015-07-30 MED ORDER — PHENYLEPHRINE HCL 10 MG/ML IJ SOLN
30.0000 ug/min | INTRAMUSCULAR | Status: DC
  Administered 2015-07-30: 52 ug/min via INTRAVENOUS
  Administered 2015-07-30: 50 ug/min via INTRAVENOUS
  Administered 2015-07-30: 30 ug/min via INTRAVENOUS
  Administered 2015-07-31: 33 ug/min via INTRAVENOUS
  Filled 2015-07-30 (×13): qty 1

## 2015-07-30 MED ORDER — SODIUM CHLORIDE 0.9 % IV BOLUS (SEPSIS)
500.0000 mL | Freq: Once | INTRAVENOUS | Status: AC
  Administered 2015-07-30: 500 mL via INTRAVENOUS

## 2015-07-30 MED ORDER — SODIUM CHLORIDE 0.9% FLUSH: 10.0000 mL | INTRAVENOUS | Status: DC | PRN

## 2015-07-30 MED ORDER — HEPARIN (PORCINE) IN NACL 100-0.45 UNIT/ML-% IJ SOLN
1450.0000 [IU]/h | INTRAMUSCULAR | Status: DC
  Administered 2015-07-31: 400 [IU]/h via INTRAVENOUS
  Administered 2015-08-02: 1450 [IU]/h via INTRAVENOUS
  Filled 2015-07-30 (×4): qty 250

## 2015-07-30 MED ORDER — CHLORHEXIDINE GLUCONATE CLOTH 2 % EX PADS
6.0000 | MEDICATED_PAD | Freq: Every day | CUTANEOUS | Status: AC
  Administered 2015-07-30 – 2015-08-03 (×4): 6 via TOPICAL

## 2015-07-30 MED ORDER — ENOXAPARIN SODIUM 40 MG/0.4ML ~~LOC~~ SOLN: 40.0000 mg | SUBCUTANEOUS | Status: DC

## 2015-07-30 MED ORDER — SODIUM CHLORIDE 0.9 % IV BOLUS (SEPSIS)
1000.0000 mL | Freq: Once | INTRAVENOUS | Status: AC
  Administered 2015-07-30: 1000 mL via INTRAVENOUS

## 2015-07-30 MED ORDER — HYDROCORTISONE NA SUCCINATE PF 100 MG IJ SOLR
50.0000 mg | Freq: Four times a day (QID) | INTRAMUSCULAR | Status: DC
  Administered 2015-07-30 – 2015-08-04 (×22): 50 mg via INTRAVENOUS
  Filled 2015-07-30 (×3): qty 2
  Filled 2015-07-30: qty 1
  Filled 2015-07-30: qty 2
  Filled 2015-07-30 (×3): qty 1
  Filled 2015-07-30: qty 2
  Filled 2015-07-30: qty 1
  Filled 2015-07-30 (×5): qty 2
  Filled 2015-07-30: qty 1
  Filled 2015-07-30 (×2): qty 2
  Filled 2015-07-30 (×2): qty 1
  Filled 2015-07-30 (×6): qty 2

## 2015-07-30 MED ORDER — PANTOPRAZOLE SODIUM 40 MG IV SOLR
40.0000 mg | INTRAVENOUS | Status: DC
  Administered 2015-07-30 – 2015-07-31 (×2): 40 mg via INTRAVENOUS
  Filled 2015-07-30 (×2): qty 40

## 2015-07-30 NOTE — Progress Notes (Signed)
[place] [place] Progress Note Patient Name: [name] DOB: March 08, 1930 MRN: [id number]   Date of Service  07/30/2015  HPI/Events of Note  reoccurent BP,low Accurate? Get reading on other ext Send lactic Cbc is stable just back Was on pred in past Get cortisol but add stress roids   eICU Interventions  May need low dose pressors, avoid if able with pvd Get cvp She is making urine, seems to be perfusing at current MAP 57     Intervention Category Major Interventions: Hypovolemia - evaluation and treatment with fluids  FEINSTEIN,DANIEL J. 07/30/2015, 1:48 AM

## 2015-07-30 NOTE — Progress Notes (Signed)
Per orders from Dr. Mortimer Fries pt. Was extubated. No 02 was needed, her Sa02 were 95-96%. She is voicing.

## 2015-07-30 NOTE — Progress Notes (Signed)
Pt is resting comfortably at this time. No complaints of pain during my shift. Pt is confused intermittently to situation and time. At this time oriented to self and place. Pt on 2L with sats at 100 percent. Left femoral and Right femoral sights remain at a Level 1 with a PAD at 10cc of air on right femoral site and wound vac on left femoral site. No new bleeding.   Report given to oncoming shift.

## 2015-07-30 NOTE — Progress Notes (Signed)
ANTICOAGULATION CONSULT NOTE - Initial Consult  Pharmacy Consult for Heparin Indication:Ischemic leg  Allergies  Allergen Reactions  . Betadine [Povidone Iodine] Hives  . Sulfa Antibiotics   . Valium [Diazepam] Anxiety    Patient Measurements: Height: 5' (152.4 cm) Weight: 201 lb (91.173 kg) IBW/kg (Calculated) : 45.5  Vital Signs: Temp: 97.8 F (36.6 C) (04/04 1200) Temp Source: Axillary (04/04 0800) BP: 107/77 mmHg (04/04 1800) Pulse Rate: 76 (04/04 1800)  Labs:  Recent Labs  07/28/15 1316 07/28/15 1514 07/29/15 0432 07/29/15 1308 07/29/15 1858 07/29/15 1958 07/30/15 0105 07/30/15 0327 07/30/15 0533  HGB 13.4  --  12.3 14.5 12.3 11.2* 10.8*  --   --   HCT 40.1  --  36.2 43.9 36.4 32.7* 32.0*  --   --   PLT 181  --  167 213 203 204 195  --   --   APTT  --  37*  --  89*  --   --   --   --   --   LABPROT  --  15.0  --   --   --   --   --   --   --   INR  --  1.16  --   --   --   --   --   --   --   HEPARINUNFRC  --   --  1.26* 0.58  --   --   --   --   --   CREATININE 1.40*  --  1.24*  --   --   --   --   --   --   CKTOTAL 32*  --   --   --   --   --   --   --  215  TROPONINI  --   --   --   --   --   --   --  2.44*  --     Estimated Creatinine Clearance: 34 mL/min (by C-G formula based on Cr of 1.24).   Medical History: Past Medical History  Diagnosis Date  . Hypertension   . Thyroid disorder   . Lymphoma ([place])   . Hypothyroidism   . Arthritis   . Diverticulosis   . Obstructive sleep apnea   . Myasthenia gravis ([place])   . Hip fracture, left ([place])   . Fracture of right humerus   . Asthma     Medications:  Scheduled:  . Chlorhexidine Gluconate Cloth  6 each Topical Q0600  . enoxaparin (LOVENOX) injection  40 mg Subcutaneous Q24H  . hydrocortisone sodium succinate  50 mg Intravenous Q6H  . mupirocin ointment  1 application Nasal BID  . pantoprazole (PROTONIX) IV  40 mg Intravenous Q24H  . piperacillin-tazobactam (ZOSYN)  IV  3.375 g Intravenous 3  times per day  . sodium chloride flush  3 mL Intravenous Q12H  . sodium chloride flush  3 mL Intravenous Q12H   Infusions:  . sodium chloride 10 mL/hr at 07/30/15 1449  . fentaNYL infusion INTRAVENOUS Stopped (07/30/15 0709)  . heparin    . phenylephrine (NEO-SYNEPHRINE) Adult infusion 50 mcg/min (07/30/15 1547)    Assessment: 82 y/o F s/p vascular procedure for ischemic leg on alteplase and heparin infusion. Per instructions from Dr. Delana Meyer, drip was to be restarted after surgery at 400 units/hr and tirtrated as follows: if APTT is <50 no change, if >50 hold drip 1 hr then resume at 100units/hr less than before. Check APTT q 4  hours. Drip was resumed after surgery at 14.17ml/hr instead of the 4 ml. Spoke with RN, will stop drip and ordered stat APTT (~1745). However it was drawn from the central line, so had to cancel lab. Redraw around 2000. Drip still off until lab results.   Goal of Therapy:   Monitor platelets by anticoagulation protocol: Yes   Plan:  Will f/u with APTT   Ramond Dial 07/30/2015,6:23 PM

## 2015-07-30 NOTE — Progress Notes (Signed)
[place] Progress Note Patient Name: [name] DOB: May 28, 1929 MRN: [id number]   Date of Service  07/30/2015  HPI/Events of Note  MAP 57 abg prior reviewed ett was rt main  eICU Interventions  ET was adjusted, repeat pcxr in am  Bolus 500 cc Rate 10 abg in am      Intervention Category Intermediate Interventions: Hypotension - evaluation and management  Raylene Miyamoto. 07/30/2015, 12:27 AM

## 2015-07-30 NOTE — Progress Notes (Addendum)
[facility]Link Physician-Brief Progress Note Patient Name: [name] DOB: 1929/06/01 MRN: [id number]   Date of Service  07/30/2015  HPI/Events of Note  Need gastric protection on vent  eICU Interventions  Add ppi     Intervention Category Major Interventions: Hypovolemia - evaluation and treatment with fluids Intermediate Interventions: Best-practice therapies (e.g. DVT, beta blocker, etc.)  Raylene Miyamoto. 07/30/2015, 1:52 AM

## 2015-07-30 NOTE — Progress Notes (Signed)
Pharmacy Antibiotic Note  [name] is a 82 y.o. female admitted on 07/28/2015 with wound infection.  Pharmacy has been consulted for Zosyn dosing.  Plan: Zosyn 3.375 grams q 8 hours ordered  Height: 5' (152.4 cm) Weight: 201 lb (91.173 kg) IBW/kg (Calculated) : 45.5  Temp (24hrs), Avg:98.5 F (36.9 C), Min:98.1 F (36.7 C), Max:98.8 F (37.1 C)   Recent Labs Lab 07/28/15 1316 07/29/15 0432 07/29/15 1308 07/29/15 1858 07/29/15 1958 07/30/15 0105  WBC 11.2* 8.9 8.9 15.9* 14.5* 15.8*  CREATININE 1.40* 1.24*  --   --   --   --     Estimated Creatinine Clearance: 34 mL/min (by C-G formula based on Cr of 1.24).    Allergies  Allergen Reactions  . Betadine [Povidone Iodine] Hives  . Sulfa Antibiotics   . Valium [Diazepam] Anxiety    Antimicrobials this admission: Cefuroxime  >> Zosyn   >>   Dose adjustments this admission:   Microbiology results: 4/4 BCx: pending 4/2 MRSA PCR: (+)  Thank you for allowing pharmacy to be a part of this patient's care.  Kynnedi Zweig S 07/30/2015 4:05 AM

## 2015-07-30 NOTE — Progress Notes (Signed)
Pt currently on heparin drip. Verified at 1130 with Dr.Schnier that Heparin drip was to continue per Nursing care order, pt also ok to sit no higher than 45 degrees, and PAD can be removed from right groin area.   Pharmacy notified at 1230 of heparin order, and pharmacy consult was put in so that pharmacy could see Dr.Schnier note to nursing and help get correct order in chart.   Dr.Schnier at bedside at 1730, will attempt to get pt up tomorrow, if neo synephrine is off. Orders given to continue Heparin.   Will continue to assess.

## 2015-07-30 NOTE — Progress Notes (Signed)
[place] Progress Note Patient Name: [name] DOB: 10-Apr-1930 MRN: [id number]   Date of Service  07/30/2015  HPI/Events of Note  Trop slight elevated, ECG neg st changes  eICU Interventions  trop likely from ischemic limb Assess cpk Echo was ordered consider asa / hep if okay by vascular in am       Intervention Category Intermediate Interventions: Diagnostic test evaluation  Raylene Miyamoto. 07/30/2015, 5:04 AM

## 2015-07-30 NOTE — Progress Notes (Signed)
[facility]  Daily Progress Note   Subjective  - 1 Day Post-Op  Patient now extubated states she is having some pain but overall feels fairly good; she remains on Neo-Synephrine with systolic pressures of approximately 100  Objective Filed Vitals:   07/30/15 1539 07/30/15 1600 07/30/15 1700 07/30/15 1800  BP: 104/58 95/67 101/61 107/77  Pulse: 134 76 79 76  Temp:      TempSrc:      Resp:      Height:      Weight:      SpO2: 91% 100% 96% 97%    Intake/Output Summary (Last 24 hours) at 07/30/15 1822 Last data filed at 07/30/15 1800  Gross per 24 hour  Intake 4533.09 ml  Output    705 ml  Net 3828.09 ml    PULM  Normal effort , no use of accessory muscles CV  No JVD, RRR Abd      No distended, nontender VASC  feet are cool but pink with good capillary refill pulses are not palpable  Laboratory CBC    Component Value Date/Time   WBC 15.8* 07/30/2015 0105   WBC 9.8 05/17/2014 1834   HGB 10.8* 07/30/2015 0105   HGB 11.6* 05/17/2014 1834   HCT 32.0* 07/30/2015 0105   HCT 36.6 05/17/2014 1834   PLT 195 07/30/2015 0105   PLT 242 05/17/2014 1834    BMET    Component Value Date/Time   NA 136 07/29/2015 0432   NA 137 05/17/2014 1834   K 4.0 07/29/2015 0432   K 4.4 05/17/2014 1834   CL 101 07/29/2015 0432   CL 100 05/17/2014 1834   CO2 31 07/29/2015 0432   CO2 30 05/17/2014 1834   GLUCOSE 149* 07/29/2015 0432   GLUCOSE 99 05/17/2014 1834   BUN 29* 07/29/2015 0432   BUN 15 05/17/2014 1834   CREATININE 1.24* 07/29/2015 0432   CREATININE 1.13 05/17/2014 1834   CALCIUM 8.7* 07/29/2015 0432   CALCIUM 8.7 05/17/2014 1834   GFRNONAA 39* 07/29/2015 0432   GFRNONAA 49* 05/17/2014 1834   GFRNONAA 35* 11/29/2012 1335   GFRNONAA 35* 11/29/2012 1335   GFRAA 45* 07/29/2015 0432   GFRAA 59* 05/17/2014 1834   GFRAA 41* 11/29/2012 1335   GFRAA 40* 11/29/2012 1335    Assessment/Planning: POD #1 s/p angiogram intervention with subsequent open  embolectomy of the left femoral system   The plan will be continue weaning the Neo-Synephrine Foley will stay in one more day as she is still on pressors I will have physical therapy evaluate her tomorrow as well as begin up in chair. She is weightbearing as tolerated    Katha Cabal  07/30/2015, 6:22 PM

## 2015-07-30 NOTE — Progress Notes (Addendum)
[place] Progress Note Patient Name: WINOLA BECHARD DOB: [birth year] MRN: [id number]   Date of Service  07/30/2015  HPI/Events of Note  Hyp[othermia now, drop MAp slight again At risk bacteremia cvp 10  eICU Interventions  Add zosyn as from [facility] Send BC Bolus to cvp goal 12 on vent Neo to map 60 Get trop, ecg     Intervention Category Major Interventions: Infection - evaluation and management  FEINSTEIN,DANIEL J. 07/30/2015, 3:18 AM

## 2015-07-30 NOTE — Consult Note (Signed)
[facility] Consultation      Name: [name] MRN: [id number] DOB: 07-05-1929    ADMISSION DATE:  07/28/2015  CHIEF COMPLAINT:   Acute esp failure   HISTORY OF PRESENT ILLNESS  82 yo white female admitted to ICU for prolonged vasc surgery S/p Thromboembolectomy left external iliac artery, common femoral artery, profunda femoris artery and superficial femoral artery Patient required full vent support last night  Intubated,sedated. Patient given IVF's Plan for SAT/SBt today     PAST MEDICAL HISTORY    :  Past Medical History  Diagnosis Date  . Hypertension   . Thyroid disorder   . Lymphoma ([place])   . Hypothyroidism   . Arthritis   . Diverticulosis   . Obstructive sleep apnea   . Myasthenia gravis ([place])   . Hip fracture, left ([place])   . Fracture of right humerus   . Asthma    Past Surgical History  Procedure Laterality Date  . Nm pet lymphoma    . Hernia repair    . Cholecystectomy    . Back surgery     Prior to Admission medications   Medication Sig Start Date End Date Taking? Authorizing Provider  acetaminophen (TYLENOL) 325 MG tablet Take 650 mg by mouth every 4 (four) hours as needed for mild pain.    Historical Provider, MD  albuterol (PROVENTIL HFA;VENTOLIN HFA) 108 (90 BASE) MCG/ACT inhaler Inhale 2 puffs into the lungs every 6 (six) hours as needed for shortness of breath.    Historical Provider, MD  aspirin EC 81 MG tablet Take 81 mg by mouth daily.    Historical Provider, MD  atenolol (TENORMIN) 25 MG tablet Take 25 mg by mouth daily.  12/05/12   Historical Provider, MD  benazepril-hydrochlorthiazide (LOTENSIN HCT) 20-12.5 MG per tablet Take 1 tablet by mouth daily.  11/05/12   Historical Provider, MD  calcium-vitamin D (OSCAL WITH D) 500-200 MG-UNIT per tablet Take 1 tablet by mouth 2 (two) times daily.    Historical Provider, MD  carboxymethylcellulose (REFRESH PLUS) 0.5 % SOLN Place 1 drop into both eyes as needed.    Historical  Provider, MD  clotrimazole-betamethasone (LOTRISONE) cream as needed.  09/01/12   Historical Provider, MD  docusate sodium (COLACE) 100 MG capsule Take 100 mg by mouth 2 (two) times daily.    Historical Provider, MD  feeding supplement, ENSURE ENLIVE, (ENSURE ENLIVE) LIQD Take 237 mLs by mouth 2 (two) times daily between meals. 09/20/14   Nicholes Mango, MD  ferrous sulfate 325 (65 FE) MG tablet Take 325 mg by mouth 2 (two) times daily with a meal.    Historical Provider, MD  levothyroxine (SYNTHROID, LEVOTHROID) 75 MCG tablet Take 75 mcg by mouth daily.  12/05/12   Historical Provider, MD  lidocaine (XYLOCAINE) 5 % ointment Apply 1 application topically every 4 (four) hours as needed (rectal pain).    Historical Provider, MD  menthol-cetylpyridinium (CEPACOL) 3 MG lozenge Take 1 lozenge by mouth as needed for sore throat.    Historical Provider, MD  Multiple Vitamins-Minerals (MULTIVITAMIN WITH MINERALS) tablet Take 1 tablet by mouth daily.    Historical Provider, MD  oxyCODONE (OXY IR/ROXICODONE) 5 MG immediate release tablet Take 1 tablet (5 mg total) by mouth 2 (two) times daily as needed for moderate pain or severe pain. 09/20/14   Nicholes Mango, MD  polyethylene glycol (MIRALAX / GLYCOLAX) packet Take 17 g by mouth daily.    Historical Provider, MD  QUEtiapine (SEROQUEL) 25 MG  tablet Take 1 tablet (25 mg total) by mouth 3 (three) times daily. 09/20/14   Nicholes Mango, MD  tolterodine (DETROL LA) 4 MG 24 hr capsule Take 4 mg by mouth daily.    Historical Provider, MD   Allergies  Allergen Reactions  . Betadine [Povidone Iodine] Hives  . Sulfa Antibiotics   . Valium [Diazepam] Anxiety     FAMILY HISTORY   Family History  Problem Relation Age of Onset  . Heart Problems Mother   . Cancer Father       SOCIAL HISTORY    reports that she has never smoked. She has never used smokeless tobacco. She reports that she does not drink alcohol or use illicit drugs.  Review of Systems  Unable to  perform ROS: critical illness      VITAL SIGNS    Temp:  [97.3 F (36.3 C)-98.1 F (36.7 C)] 97.3 F (36.3 C) (04/04 0400) Pulse Rate:  [66-114] 69 (04/04 0200) Resp:  [10-38] 10 (04/04 0704) BP: (71-131)/(47-102) 73/51 mmHg (04/04 0704) SpO2:  [85 %-100 %] 98 % (04/04 0430) FiO2 (%):  [35 %] 35 % (04/04 0401) HEMODYNAMICS: CVP:  [10 mmHg-11 mmHg] 10 mmHg VENTILATOR SETTINGS: Vent Mode:  [-] PRVC FiO2 (%):  [35 %] 35 % Set Rate:  [10 bmp-18 bmp] 10 bmp Vt Set:  [400 mL] 400 mL PEEP:  [5 cmH20] 5 cmH20 INTAKE / OUTPUT:  Intake/Output Summary (Last 24 hours) at 07/30/15 0730 Last data filed at 07/30/15 0543  Gross per 24 hour  Intake  252.5 ml  Output    750 ml  Net -497.5 ml       PHYSICAL EXAM   Physical Exam  Constitutional: No distress.  HENT:  Head: Normocephalic and atraumatic.  Eyes: Pupils are equal, round, and reactive to light. No scleral icterus.  Neck: Normal range of motion. Neck supple.  Cardiovascular: Normal rate and regular rhythm.   No murmur heard. Pulmonary/Chest: No respiratory distress. She has no wheezes. She has no rales.  resp distress  Abdominal: Soft. She exhibits no distension. There is no tenderness.  Musculoskeletal: She exhibits no edema.  Wound vac in left leg, compressive device RT leg  Neurological: She displays normal reflexes. Coordination normal.  gcs<8T  Skin: Skin is warm. No rash noted. She is not diaphoretic.       LABS   LABS:  CBC  Recent Labs Lab 07/29/15 1858 07/29/15 1958 07/30/15 0105  WBC 15.9* 14.5* 15.8*  HGB 12.3 11.2* 10.8*  HCT 36.4 32.7* 32.0*  PLT 203 204 195   Coag's  Recent Labs Lab 07/28/15 1514 07/29/15 1308  APTT 37* 89*  INR 1.16  --    BMET  Recent Labs Lab 07/28/15 1316 07/29/15 0432  NA 132* 136  K 4.0 4.0  CL 98* 101  CO2 27 31  BUN 28* 29*  CREATININE 1.40* 1.24*  GLUCOSE 97 149*   Electrolytes  Recent Labs Lab 07/28/15 1316 07/29/15 0432  CALCIUM  8.9 8.7*   Sepsis Markers  Recent Labs Lab 07/30/15 0327  LATICACIDVEN 1.7   ABG  Recent Labs Lab 07/29/15 2315 07/30/15 0503  PHART 7.53* 7.34*  PCO2ART 33 52*  PO2ART 102 107   Liver Enzymes No results for input(s): AST, ALT, ALKPHOS, BILITOT, ALBUMIN in the last 168 hours. Cardiac Enzymes  Recent Labs Lab 07/30/15 0327  TROPONINI 2.44*   Glucose No results for input(s): GLUCAP in the last 168 hours.   Recent Results (from  the past 240 hour(s))  MRSA PCR Screening     Status: Abnormal   Collection Time: 07/28/15 10:00 PM  Result Value Ref Range Status   MRSA by PCR POSITIVE (A) NEGATIVE Final    Comment:        The GeneXpert MRSA Assay (FDA approved for NASAL specimens only), is one component of a comprehensive MRSA colonization surveillance program. It is not intended to diagnose MRSA infection nor to guide or monitor treatment for MRSA infections. CRITICAL RESULT CALLED TO, READ BACK BY AND VERIFIED WITH: CALLED TO LISA ROMERO @ 1287 ON 07/28/2015 BY CAF      Current facility-administered medications:  .  0.9 %  sodium chloride infusion, , Intravenous, Continuous, Algernon Huxley, MD, Last Rate: 75 mL/hr at 07/29/15 0715 .  acetaminophen (TYLENOL) tablet 650 mg, 650 mg, Oral, Q6H PRN **OR** acetaminophen (TYLENOL) suppository 650 mg, 650 mg, Rectal, Q6H PRN, Katha Cabal, MD .  albuterol (PROVENTIL) (2.5 MG/3ML) 0.083% nebulizer solution 3 mL, 3 mL, Inhalation, Q6H PRN, Katha Cabal, MD .  cefUROXime (ZINACEF) 1.5 g in dextrose 5 % 50 mL IVPB, 1.5 g, Intravenous, Q12H, Katha Cabal, MD, 1.5 g at 07/30/15 0016 .  fentaNYL (SUBLIMAZE) bolus via infusion 25 mcg, 25 mcg, Intravenous, Q1H PRN, Kara Mead V, MD .  fentaNYL 2512mg in NS 2551m(1072mml) infusion-PREMIX, 25-200 mcg/hr, Intravenous, Continuous, RakRigoberto NoelD, Stopped at 07/30/15 [phone] HYDROcodone-acetaminophen (NORCO/VICODIN) 5-325 MG per tablet 1-2 tablet, 1-2 tablet, Oral, Q4H  PRN, GreKatha CabalD .  hydrocortisone sodium succinate (SOLU-CORTEF) 100 MG injection 50 mg, 50 mg, Intravenous, Q6H, DanRaylene MiyamotoD, 50 mg at 07/30/15 0243 .  magnesium hydroxide (MILK OF MAGNESIA) suspension 30 mL, 30 mL, Oral, Daily PRN, GreKatha CabalD .  midazolam (VERSED) injection 1 mg, 1 mg, Intravenous, Q1H PRN, GreKatha CabalD .  midazolam (VERSED) injection 1 mg, 1 mg, Intravenous, Q15 min PRN, RakKara Mead MD .  midazolam (VERSED) injection 1 mg, 1 mg, Intravenous, Q2H PRN, RakKara Mead MD, 1 mg at 07/30/15 0241 .  morphine 2 MG/ML injection 2 mg, 2 mg, Intravenous, Q1H PRN, GreKatha CabalD .  [DISCONTINUED] ondansetron (ZOFRAN) tablet 4 mg, 4 mg, Oral, Q6H PRN **OR** ondansetron (ZOFRAN) injection 4 mg, 4 mg, Intravenous, Q6H PRN, GreKatha CabalD .  pantoprazole (PROTONIX) injection 40 mg, 40 mg, Intravenous, Q24H, DanRaylene MiyamotoD, 40 mg at 07/30/15 0255 .  phenylephrine (NEO-SYNEPHRINE) 10 mg in dextrose 5 % 250 mL (0.04 mg/mL) infusion, 30-200 mcg/min, Intravenous, Continuous, DanRaylene MiyamotoD, Last Rate: 60 mL/hr at 07/30/15 0428, 40 mcg/min at 07/30/15 0428 .  piperacillin-tazobactam (ZOSYN) IVPB 3.375 g, 3.375 g, Intravenous, 3 times per day, DanRaylene MiyamotoD, 3.375 g at 07/30/15 0543 .  sodium chloride flush (NS) 0.9 % injection 3 mL, 3 mL, Intravenous, Q12H, GreKatha CabalD, 3 mL at 07/28/15 2200 .  sodium chloride flush (NS) 0.9 % injection 3 mL, 3 mL, Intravenous, Q12H, GreKatha CabalD, 3 mL at 07/29/15 1226 .  sodium chloride flush (NS) 0.9 % injection 3 mL, 3 mL, Intravenous, PRN, GreKatha CabalD .  sorbitol 70 % solution 30 mL, 30 mL, Oral, Daily PRN, GreKatha CabalD  IMAGING    Portable Chest Xray  07/29/2015  CLINICAL DATA:  Intubation. EXAM: PORTABLE CHEST 1 VIEW COMPARISON:  09/11/2014 FINDINGS: The endotracheal tube is at  the level of the carina, directed toward the right mainstem.  There is a right jugular central line with tip in the low SVC. There is no pneumothorax. There is left base consolidation due to atelectasis or infiltrate. Right lung is grossly clear. Pulmonary vasculature is normal. IMPRESSION: 1. Endotracheal tube is at the level of the carina, directed to the right mainstem bronchus origin. Recommend withdrawing the tube 2-3 cm. 2. Right jugular central line.  No pneumothorax. 3. Left base consolidation. Electronically Signed   By: Andreas Newport M.D.   On: 07/29/2015 23:23      Indwelling Urinary Catheter continued, requirement due to   Reason to continue Indwelling Urinary Catheter for strict Intake/Output monitoring for hemodynamic instability   Central Line continued, requirement due to   Reason to continue [employer] Monitoring of central venous pressure or other hemodynamic parameters   Ventilator continued, requirement due to, resp failure    Ventilator Sedation RASS 0 to -2      INDWELLING DEVICES:: RT IJ CVL 4/3>>>>  MICRO DATA: MRSA PCR POS   ANTIMICROBIALS: zosyn>>>    ASSESSMENT/PLAN  82 yo white female admitted for post op resp failure with hypotension-sedation vs hypovolumia Plan for SAT/SBT today  PULMONARY 1.Respiratory Failure -continue Full MV support -continue Bronchodilator Therapy -Wean Fio2 and PEEP as tolerated -will perform SAT/SBt when respiratory parameters are met   CARDIOVASCULAR Shock-volume depleted,sedative related -will give IVFss as needed  RENAL Follow chem 7 and UO, cont foley catheter  GASTROINTESTINAL NPO for now  HEMATOLOGIC Follow CBC Transfusion guidelines  INFECTIOUS Empiric abx   ENDOCRINE Follow FSBS  NEUROLOGIC Wean sedation assess neuro status    I have personally obtained a history, examined the patient, evaluated laboratory and independently reviewed  imaging results, formulated the assessment and plan and placed orders.  The Patient requires high complexity  decision making for assessment and support, frequent evaluation and titration of therapies, application of advanced monitoring technologies and extensive interpretation of multiple databases. Critical Care Time devoted to patient care services described in this note is 40 minutes.   Overall, patient is critically ill, prognosis is guarded.   Corrin Parker, M.D.  [facility]  Medical Director [place] Director [facility] Cardio-Pulmonary Department

## 2015-07-30 NOTE — Plan of Care (Signed)
Problem: Safety: Goal: Ability to remain free from injury will improve Outcome: Progressing Pt has remained free from falls and injury on my shift. Bed alarm on and family stayed at bedside majority of shift to help with pt orientation.

## 2015-07-31 ENCOUNTER — Encounter: Payer: Self-pay | Admitting: Vascular Surgery

## 2015-07-31 ENCOUNTER — Inpatient Hospital Stay
Admit: 2015-07-31 | Discharge: 2015-07-31 | Disposition: A | Discharge status: Home / Self Care | Payer: PPO | Attending: Vascular Surgery | Admitting: Vascular Surgery

## 2015-07-31 LAB — BLOOD GAS, ARTERIAL
ACID-BASE EXCESS: 5.1 mmol/L — AB (ref 0.0–3.0)
ACID-BASE EXCESS: 1.5 mmol/L (ref 0.0–3.0)
Bicarbonate: 27.6 mEq/L (ref 21.0–28.0)
Bicarbonate: 28.1 mEq/L — ABNORMAL HIGH (ref 21.0–28.0)
FIO2: 0.35
FIO2: 0.35
MECHANICAL RATE: 10
Mechanical Rate: 18
O2 SAT: 98.5 %
O2 Saturation: 97.8 %
PATIENT TEMPERATURE: 37
PATIENT TEMPERATURE: 37
PCO2 ART: 33 mmHg (ref 32.0–48.0)
PEEP/CPAP: 5 cmH2O
PEEP/CPAP: 5 cmH2O
PH ART: 7.53 — AB (ref 7.350–7.450)
PO2 ART: 107 mmHg (ref 83.0–108.0)
VT: 400 mL
VT: 400 mL
pCO2 arterial: 52 mmHg — ABNORMAL HIGH (ref 32.0–48.0)
pH, Arterial: 7.34 — ABNORMAL LOW (ref 7.350–7.450)
pO2, Arterial: 102 mmHg (ref 83.0–108.0)

## 2015-07-31 LAB — CBC
HCT: 25.3 % — ABNORMAL LOW (ref 35.0–47.0)
Hemoglobin: 8.3 g/dL — ABNORMAL LOW (ref 12.0–16.0)
MCH: 29.7 pg (ref 26.0–34.0)
MCHC: 33 g/dL (ref 32.0–36.0)
MCV: 89.9 fL (ref 80.0–100.0)
PLATELETS: 222 10*3/uL (ref 150–440)
RBC: 2.81 MIL/uL — AB (ref 3.80–5.20)
RDW: 14.8 % — ABNORMAL HIGH (ref 11.5–14.5)
WBC: 13.3 10*3/uL — AB (ref 3.6–11.0)

## 2015-07-31 LAB — SURGICAL PATHOLOGY

## 2015-07-31 LAB — APTT
APTT: 30 s (ref 24–36)
APTT: 30 s (ref 24–36)
APTT: 31 s (ref 24–36)
aPTT: 31 seconds (ref 24–36)

## 2015-07-31 MED ORDER — QUETIAPINE FUMARATE 25 MG PO TABS
25.0000 mg | ORAL_TABLET | Freq: Three times a day (TID) | ORAL | Status: DC
  Administered 2015-07-31: 25 mg via ORAL
  Filled 2015-07-31: qty 1

## 2015-07-31 NOTE — Progress Notes (Signed)
ANTICOAGULATION CONSULT NOTE - Initial Consult  Pharmacy Consult for Heparin Indication:Ischemic leg  Allergies  Allergen Reactions  . Betadine [Povidone Iodine] Hives  . Sulfa Antibiotics   . Valium [Diazepam] Anxiety    Patient Measurements: Height: 5' (152.4 cm) Weight: 201 lb (91.173 kg) IBW/kg (Calculated) : 45.5  Vital Signs: Temp: 98.6 F (37 C) (04/05 1400) Temp Source: Axillary (04/05 1400) BP: 110/64 mmHg (04/05 1700) Pulse Rate: 87 (04/05 1500)  Labs:  Recent Labs  07/29/15 0432 07/29/15 1308  07/29/15 1958 07/30/15 0105 07/30/15 0327 07/30/15 0533  07/31/15 0813 07/31/15 1158 07/31/15 1607  HGB 12.3 14.5  < > 11.2* 10.8*  --   --   --   --  8.3*  --   HCT 36.2 43.9  < > 32.7* 32.0*  --   --   --   --  25.3*  --   PLT 167 213  < > 204 195  --   --   --   --  222  --   APTT  --  89*  --   --   --   --   --   < > 31 30 30   HEPARINUNFRC 1.26* 0.58  --   --   --   --   --   --   --   --   --   CREATININE 1.24*  --   --   --   --   --   --   --   --   --   --   CKTOTAL  --   --   --   --   --   --  215  --   --   --   --   TROPONINI  --   --   --   --   --  2.44*  --   --   --   --   --   < > = values in this interval not displayed.  Estimated Creatinine Clearance: 34 mL/min (by C-G formula based on Cr of 1.24).   Medical History: Past Medical History  Diagnosis Date  . Hypertension   . Thyroid disorder   . Lymphoma ([place])   . Hypothyroidism   . Arthritis   . Diverticulosis   . Obstructive sleep apnea   . Myasthenia gravis ([place])   . Hip fracture, left ([place])   . Fracture of right humerus   . Asthma     Medications:  Scheduled:  . Chlorhexidine Gluconate Cloth  6 each Topical Q0600  . hydrocortisone sodium succinate  50 mg Intravenous Q6H  . mupirocin ointment  1 application Nasal BID  . piperacillin-tazobactam (ZOSYN)  IV  3.375 g Intravenous 3 times per day  . sodium chloride flush  3 mL Intravenous Q12H  . sodium chloride flush  3 mL  Intravenous Q12H   Infusions:  . sodium chloride 10 mL/hr at 07/31/15 0700  . fentaNYL infusion INTRAVENOUS Stopped (07/30/15 0709)  . heparin 400 Units/hr (07/31/15 0115)  . phenylephrine (NEO-SYNEPHRINE) Adult infusion 32 mcg/min (07/31/15 1700)    Assessment: 82 y/o F s/p vascular procedure for ischemic leg on alteplase and heparin infusion. Per instructions from Dr. Delana Meyer, drip was to be restarted after surgery at 400 units/hr and tirtrated as follows: if APTT is <50 no change, if >50 hold drip 1 hr then resume at 100units/hr less than before.    Goal of Therapy:   Monitor platelets by anticoagulation  protocol: Yes   Plan:  APTT remains < 50 so will continue heparin infusion at 400 units/hr. APTT every 4 hours while infusing per vascular.   Onya Eutsler D 07/31/2015,6:10 PM

## 2015-07-31 NOTE — Progress Notes (Signed)
Patient has been in and out of sleep throughout the night, requesting pain medicine once.  She remains confused although this is baseline according to the family.  Patient remains NPO but has been asking for food and tolerated ice chips with supervision well.  Blood pressures have continued to be soft but MAP has remained >60 on Neo-Synephrine.  Will continue to monitor.

## 2015-07-31 NOTE — Evaluation (Signed)
Physical Therapy Evaluation Patient Details Name: [name] MRN: [id number] DOB: [birth year] Today's Date: 07/31/2015   History of Present Illness  Pt is a pleasant 82 y.o. F admitted to hospital for ischemia in L leg. Pt received angiogram in B LE and thromboembolectomy in L LE on 4/3. Pt intubated on 4/3 and extubated on 4/4. Pt has hx of HTN, thyroid disorder, myasthenia gravis, L hip fx, R humerus fx, A-fib, and dementia.    Clinical Impression  Pt is a pleasant 82 y.o. F admitted to hospital for ischemia in L leg. Pt s/p angiogram on B LE and thromboembolectomy in L LE on 4/3. Pt intubated on 4/3 and extubated on 4/4. Pt has hx of HTN, thyroid disorder, myasthenia gravis, L hip fx, R humerus fx, a-rib and dementia. Pts troponin elevated prior to eval, confirmed in note due to ischemia and no ECT st changes. Pt awake and alert during eval. Pt oriented to self but not place. Pt often expressed confusion about what was going on, however able to follow simple instructions. Prior to admission, pt resided at [facility] for the past year. For past 6 months, pt ambulated using WC. 1 week prior to admission, pt unable to ambulate. Pt required assist w/ all ADLs and transfers. During eval, pt able to perform partial transfer in bed with 2+ max assist for trunk and B LE. Unable to perform transfers out of bed or ambulation due to pts strength and pain. Pt performed supine there-ex with min assist, requiring frequent cues on proper form and speed. Pt motivated to participate in PT. Pt demonstrates deficits in strength, ROM and mobility. Pt would benefit from further skilled therapy to address deficits and return to PLOF; recommend pt sent to SNF after discharge from acute hospitalization.     Follow Up Recommendations SNF    Equipment Recommendations       Recommendations for Other Services       Precautions / Restrictions Precautions Precautions: Fall Restrictions Weight Bearing Restrictions: No       Mobility  Bed Mobility Overal bed mobility: +2 for physical assistance             General bed mobility comments: Pt able to perform partial transfer to EOB with 2+ max assist. Pt required assist for B LE and trunk. Pt unable to use UE to help sit up. Mobility limited per pts request to stop. Pt required +2 assist to slide up towards head of bed, pt able to tuck chin and cross arms.   Transfers                 General transfer comment: Transfer not performed for safety reasons due to pts strength and mobility.   Ambulation/Gait             General Gait Details: Unable to perform ambulation due to pts strength, mobility and PLOF.   Stairs            Wheelchair Mobility    Modified Rankin (Stroke Patients Only)       Balance Overall balance assessment: Needs assistance     Sitting balance - Comments: Unable to fully assess pts balance due to unable to sit on EOB. Pt required max assist to keep trunk upright once partial transfer performed.  Postural control: Posterior lean  Pertinent Vitals/Pain Pain Assessment: 0-10 Pain Score: 10-Worst pain ever Pain Location: B LE Pain Intervention(s): Limited activity within patient's tolerance    Home Living Family/patient expects to be discharged to:: Skilled nursing facility                 Additional Comments: Pt lived at [facility] prior to admission.     Prior Function Level of Independence: Needs assistance   Gait / Transfers Assistance Needed: Pt able to ambulate with WC until 1 week prior to admission unable to ambulate at all. Pt requires assist with transfers to [facility] and getting in/out of bed.   ADL's / Homemaking Assistance Needed: Assist from staff for all ADLs  Comments: Pt is poor historian due to dementia. No family in room to confirm PLOF.      Hand Dominance        Extremity/Trunk Assessment   Upper Extremity Assessment: RUE  deficits/detail;LUE deficits/detail RUE Deficits / Details: R UE grossly 3/5      LUE Deficits / Details: L UE grossly 3+/5   Lower Extremity Assessment: RLE deficits/detail;LLE deficits/detail RLE Deficits / Details: R LE grossly 3+/5, limited knee flex ROM LLE Deficits / Details: L LE grossly 3+/5, limited knee flex ROM     Communication   Communication: No difficulties  Cognition Arousal/Alertness: Awake/alert Behavior During Therapy: WFL for tasks assessed/performed Overall Cognitive Status: History of cognitive impairments - at baseline       Memory: Decreased short-term memory              General Comments      Exercises Other Exercises Other Exercises: Pt performed supine ther-ex on B LE including ankle pumps, SLR, and heel slides w/min assist. Pt required heavy cueing regarding proper speed and form w/exercises. All ther-ex performed x10 reps.        Assessment/Plan    PT Assessment Patient needs continued PT services  PT Diagnosis Difficulty walking;Generalized weakness;Acute pain   PT Problem List Decreased strength;Decreased range of motion;Decreased activity tolerance;Decreased balance;Decreased mobility;Pain;Obesity  PT Treatment Interventions Therapeutic exercise;Wheelchair mobility training;Balance training;Functional mobility training   PT Goals (Current goals can be found in the Care Plan section) Acute Rehab PT Goals Patient Stated Goal: to return to PLOF PT Goal Formulation: Patient unable to participate in goal setting Time For Goal Achievement: 08/14/15 Potential to Achieve Goals: Fair    Frequency Min 2X/week   Barriers to discharge Decreased caregiver support Pt currently unable to assist with transfers and ADLs necessary to return to ALF.    Co-evaluation               End of Session   Activity Tolerance: Patient limited by fatigue;Patient limited by pain Patient left: in bed;with call bell/phone within reach;with bed alarm  set;Other (comment) (Lab staff in room)           Time: [id number] PT Time Calculation (min) (ACUTE ONLY): 21 min   Charges:         PT G Codes:        Sherral Hammers 08/27/2015, 4:44 PM M. Barnett Abu, SPT

## 2015-07-31 NOTE — Progress Notes (Signed)
[facility]  Daily Progress Note   Subjective  - 2 Day Post-Op  Patient very confused  Objective Filed Vitals:   07/31/15 1200 07/31/15 1400 07/31/15 1500 07/31/15 1700  BP: 90/50 124/64 139/114 110/64  Pulse: 94  87   Temp:  98.6 F (37 C)    TempSrc:  Axillary    Resp: 25  29   Height:      Weight:      SpO2: 95%  100%     Intake/Output Summary (Last 24 hours) at 07/31/15 1943 Last data filed at 07/31/15 1800  Gross per 24 hour  Intake 2195.06 ml  Output    750 ml  Net 1445.06 ml    PULM  Normal effort , no use of accessory muscles CV  No JVD, RRR Abd      No distended, nontender VASC  feet are pink with good capillary refill; 1+ DP pulses  Laboratory CBC    Component Value Date/Time   WBC 13.3* 07/31/2015 1158   WBC 9.8 05/17/2014 1834   HGB 8.3* 07/31/2015 1158   HGB 11.6* 05/17/2014 1834   HCT 25.3* 07/31/2015 1158   HCT 36.6 05/17/2014 1834   PLT 222 07/31/2015 1158   PLT 242 05/17/2014 1834    BMET    Component Value Date/Time   NA 136 07/29/2015 0432   NA 137 05/17/2014 1834   K 4.0 07/29/2015 0432   K 4.4 05/17/2014 1834   CL 101 07/29/2015 0432   CL 100 05/17/2014 1834   CO2 31 07/29/2015 0432   CO2 30 05/17/2014 1834   GLUCOSE 149* 07/29/2015 0432   GLUCOSE 99 05/17/2014 1834   BUN 29* 07/29/2015 0432   BUN 15 05/17/2014 1834   CREATININE 1.24* 07/29/2015 0432   CREATININE 1.13 05/17/2014 1834   CALCIUM 8.7* 07/29/2015 0432   CALCIUM 8.7 05/17/2014 1834   GFRNONAA 39* 07/29/2015 0432   GFRNONAA 49* 05/17/2014 1834   GFRNONAA 35* 11/29/2012 1335   GFRNONAA 35* 11/29/2012 1335   GFRAA 45* 07/29/2015 0432   GFRAA 59* 05/17/2014 1834   GFRAA 41* 11/29/2012 1335   GFRAA 40* 11/29/2012 1335    Assessment/Planning: POD #2 s/p angiogram intervention with subsequent open embolectomy of the left femoral system   The plan will be continue weaning the Neo-Synephrine Foley will be DC'd  Isha Seefeld, Dolores Lory  07/31/2015, 7:43 PM

## 2015-07-31 NOTE — Progress Notes (Signed)
Pharmacy Antibiotic Note  [name] is a 82 y.o. female admitted on 07/28/2015 with wound infection.  Pharmacy has been consulted for Zosyn dosing.  Plan: Continue Zosyn 3.375 grams q 8 h EI.   Height: 5' (152.4 cm) Weight: 201 lb (91.173 kg) IBW/kg (Calculated) : 45.5  Temp (24hrs), Avg:98 F (36.7 C), Min:97.5 F (36.4 C), Max:98.7 F (37.1 C)   Recent Labs Lab 07/28/15 1316 07/29/15 0432 07/29/15 1308 07/29/15 1858 07/29/15 1958 07/30/15 0105 07/30/15 0327  WBC 11.2* 8.9 8.9 15.9* 14.5* 15.8*  --   CREATININE 1.40* 1.24*  --   --   --   --   --   LATICACIDVEN  --   --   --   --   --   --  1.7    Estimated Creatinine Clearance: 34 mL/min (by C-G formula based on Cr of 1.24).    Allergies  Allergen Reactions  . Betadine [Povidone Iodine] Hives  . Sulfa Antibiotics   . Valium [Diazepam] Anxiety    Antimicrobials this admission: Cefuroxime  4/4 >> 4/4 Zosyn 4/4  >>   Dose adjustments this admission:   Microbiology results: 4/4 BCx: pending 4/2 MRSA PCR: (+)  Thank you for allowing pharmacy to be a part of this patient's care.  Ulice Dash D 07/31/2015 11:23 AM

## 2015-07-31 NOTE — Care Management (Signed)
Thromboembolus left iliofemoral system; ischemic lower extremity on 4/3.  Weaning pressors.  Remains on heparin drip.  Order present for physical therapy consult.  She presents from [facility] assisted [facility].  Per Janett Billow at [facility], patient has not "really ambulated long distances- ie walk to the dining room- for approximatley 6 months, but she was able to ambulate short distances in her room.  She used a wheelchair to get to the dining room.  In order to return to assisted living, must be able to "get around in her room by herself."  She can still continue to use the wheelchair to go longer distances but must be able to assist with transfer. Dr Delana Meyer anticipates patient will need short term skilled nursing. Physical therapy consult is pending

## 2015-07-31 NOTE — Progress Notes (Signed)
Pt is in stable condition at this time. Report given to Dana,R.N.. No complaints of pain at this time. Family at bedside.

## 2015-07-31 NOTE — NC FL2 (Signed)
[place] LEVEL OF CARE SCREENING TOOL     IDENTIFICATION  Patient Name: [name] Birthdate: [birth year] Sex: female Admission Date (Current Location): 07/28/2015  [place] and [place] Number:  Engineering geologist and Address:  [facility], [address] [place], [place] [address]      Provider Number: [id number]  Attending Physician Name and Address:  Katha Cabal, MD  Relative Name and Phone Number:       Current Level of Care: Hospital Recommended Level of Care: [place] Prior Approval Number:    Date Approved/Denied:   PASRR Number: [id number]  Discharge Plan: SNF    Current Diagnoses: Patient Active Problem List   Diagnosis Date Noted  . Ischemic leg 07/28/2015  . Acute delirium 09/14/2014  . Acute encephalopathy 09/12/2014  . HTN (hypertension) 09/12/2014  . Hypothyroidism 09/12/2014  . A-fib ([place]) 09/12/2014  . Encephalopathy acute 09/12/2014  . Diplopia 12/07/2012    Orientation RESPIRATION BLADDER Height & Weight     Self    Continent Weight: 201 lb (91.173 kg) Height:  5' (152.4 cm)  BEHAVIORAL SYMPTOMS/MOOD NEUROLOGICAL BOWEL NUTRITION STATUS   (none)  (none) Continent Diet (regular)  AMBULATORY STATUS COMMUNICATION OF NEEDS Skin   Extensive Assist Verbally Surgical wounds                       Personal Care Assistance Level of Assistance  Bathing, Feeding, Dressing Bathing Assistance: Maximum assistance Feeding assistance: Maximum assistance Dressing Assistance: Maximum assistance     Functional Limitations Info             SPECIAL CARE FACTORS FREQUENCY  PT (By licensed PT)                    Contractures Contractures Info: Not present    Additional Factors Info  Code Status Code Status Info: Full             Current Medications (07/31/2015):  This is the current hospital active medication list Current Facility-Administered Medications   Medication Dose Route Frequency Provider Last Rate Last Dose  . 0.9 %  sodium chloride infusion   Intravenous Continuous Algernon Huxley, MD 10 mL/hr at 07/30/15 1449    . acetaminophen (TYLENOL) tablet 650 mg  650 mg Oral Q6H PRN Katha Cabal, MD       Or  . acetaminophen (TYLENOL) suppository 650 mg  650 mg Rectal Q6H PRN Katha Cabal, MD      . albuterol (PROVENTIL) (2.5 MG/3ML) 0.083% nebulizer solution 3 mL  3 mL Inhalation Q6H PRN Katha Cabal, MD      . Chlorhexidine Gluconate Cloth 2 % PADS 6 each  6 each Topical Q0600 Flora Lipps, MD   6 each at 07/31/15 0600  . fentaNYL (SUBLIMAZE) bolus via infusion 25 mcg  25 mcg Intravenous Q1H PRN Kara Mead V, MD      . fentaNYL 2592mcg in NS 218mL (72mcg/ml) infusion-PREMIX  25-200 mcg/hr Intravenous Continuous Rigoberto Noel, MD   Stopped at 07/30/15 0709  . heparin ADULT infusion 100 units/mL (25000 units/250 mL)  400 Units/hr Intravenous Continuous Katha Cabal, MD 4 mL/hr at 07/31/15 0115 400 Units/hr at 07/31/15 0115  . HYDROcodone-acetaminophen (NORCO/VICODIN) 5-325 MG per tablet 1-2 tablet  1-2 tablet Oral Q4H PRN Katha Cabal, MD      . hydrocortisone sodium succinate (SOLU-CORTEF) 100 MG injection 50 mg  50  mg Intravenous Q6H Raylene Miyamoto, MD   50 mg at 07/31/15 0741  . magnesium hydroxide (MILK OF MAGNESIA) suspension 30 mL  30 mL Oral Daily PRN Katha Cabal, MD      . midazolam (VERSED) injection 1 mg  1 mg Intravenous Q1H PRN Katha Cabal, MD      . midazolam (VERSED) injection 1 mg  1 mg Intravenous Q15 min PRN Kara Mead V, MD      . midazolam (VERSED) injection 1 mg  1 mg Intravenous Q2H PRN Rigoberto Noel, MD   1 mg at 07/30/15 0241  . morphine 2 MG/ML injection 2 mg  2 mg Intravenous Q1H PRN Katha Cabal, MD   2 mg at 07/31/15 0131  . mupirocin ointment (BACTROBAN) 2 % 1 application  1 application Nasal BID Flora Lipps, MD   1 application at [id number]  . ondansetron (ZOFRAN) injection  4 mg  4 mg Intravenous Q6H PRN Katha Cabal, MD      . phenylephrine (NEO-SYNEPHRINE) 10 mg in dextrose 5 % 250 mL (0.04 mg/mL) infusion  30-200 mcg/min Intravenous Continuous Raylene Miyamoto, MD 60 mL/hr at 07/31/15 1046 40 mcg/min at 07/31/15 1046  . piperacillin-tazobactam (ZOSYN) IVPB 3.375 g  3.375 g Intravenous 3 times per day Raylene Miyamoto, MD   3.375 g at 07/31/15 0552  . sodium chloride flush (NS) 0.9 % injection 10-40 mL  10-40 mL Intracatheter PRN Katha Cabal, MD      . sodium chloride flush (NS) 0.9 % injection 3 mL  3 mL Intravenous Q12H Katha Cabal, MD   3 mL at 07/30/15 2200  . sodium chloride flush (NS) 0.9 % injection 3 mL  3 mL Intravenous Q12H Katha Cabal, MD   3 mL at 07/30/15 1052  . sodium chloride flush (NS) 0.9 % injection 3 mL  3 mL Intravenous PRN Katha Cabal, MD      . sorbitol 70 % solution 30 mL  30 mL Oral Daily PRN Katha Cabal, MD         Discharge Medications: Please see discharge summary for a list of discharge medications.  Relevant Imaging Results:  Relevant Lab Results:   Additional Information SS: [id number]  Shela Leff, LCSW

## 2015-07-31 NOTE — Progress Notes (Signed)
ANTICOAGULATION CONSULT NOTE - Initial Consult  Pharmacy Consult for Heparin Indication:Ischemic leg  Allergies  Allergen Reactions  . Betadine [Povidone Iodine] Hives  . Sulfa Antibiotics   . Valium [Diazepam] Anxiety    Patient Measurements: Height: 5' (152.4 cm) Weight: 201 lb (91.173 kg) IBW/kg (Calculated) : 45.5  Vital Signs: Temp: 98.7 F (37.1 C) (04/05 0730) Temp Source: Oral (04/05 0730) BP: 94/51 mmHg (04/05 0730) Pulse Rate: 89 (04/05 0600)  Labs:  Recent Labs  07/28/15 1316  07/28/15 1514 07/29/15 0432 07/29/15 1308 07/29/15 1858 07/29/15 1958 07/30/15 0105 07/30/15 0327 07/30/15 0533 07/30/15 2205 07/31/15 0813  HGB 13.4  --   --  12.3 14.5 12.3 11.2* 10.8*  --   --   --   --   HCT 40.1  --   --  36.2 43.9 36.4 32.7* 32.0*  --   --   --   --   PLT 181  --   --  167 213 203 204 195  --   --   --   --   APTT  --   < > 37*  --  89*  --   --   --   --   --  46* 31  LABPROT  --   --  15.0  --   --   --   --   --   --   --   --   --   INR  --   --  1.16  --   --   --   --   --   --   --   --   --   HEPARINUNFRC  --   --   --  1.26* 0.58  --   --   --   --   --   --   --   CREATININE 1.40*  --   --  1.24*  --   --   --   --   --   --   --   --   CKTOTAL 32*  --   --   --   --   --   --   --   --  215  --   --   TROPONINI  --   --   --   --   --   --   --   --  2.44*  --   --   --   < > = values in this interval not displayed.  Estimated Creatinine Clearance: 34 mL/min (by C-G formula based on Cr of 1.24).   Medical History: Past Medical History  Diagnosis Date  . Hypertension   . Thyroid disorder   . Lymphoma ([place])   . Hypothyroidism   . Arthritis   . Diverticulosis   . Obstructive sleep apnea   . Myasthenia gravis ([place])   . Hip fracture, left ([place])   . Fracture of right humerus   . Asthma     Medications:  Scheduled:  . Chlorhexidine Gluconate Cloth  6 each Topical Q0600  . hydrocortisone sodium succinate  50 mg Intravenous Q6H  .  mupirocin ointment  1 application Nasal BID  . piperacillin-tazobactam (ZOSYN)  IV  3.375 g Intravenous 3 times per day  . sodium chloride flush  3 mL Intravenous Q12H  . sodium chloride flush  3 mL Intravenous Q12H   Infusions:  . sodium chloride 10 mL/hr at 07/30/15 1449  . fentaNYL infusion  INTRAVENOUS Stopped (07/30/15 0709)  . heparin 400 Units/hr (07/31/15 0115)  . phenylephrine (NEO-SYNEPHRINE) Adult infusion 40 mcg/min (07/31/15 1046)    Assessment: 82 y/o F s/p vascular procedure for ischemic leg on alteplase and heparin infusion. Per instructions from Dr. Delana Meyer, drip was to be restarted after surgery at 400 units/hr and tirtrated as follows: if APTT is <50 no change, if >50 hold drip 1 hr then resume at 100units/hr less than before.    Goal of Therapy:   Monitor platelets by anticoagulation protocol: Yes   Plan:  APTT remains < 50 so will continue heparin infusion at 400 units/hr. APTT every 4 hours while infusing per vascular.   Ulice Dash D 07/31/2015,11:13 AM

## 2015-07-31 NOTE — Progress Notes (Signed)
ANTICOAGULATION CONSULT NOTE - Initial Consult  Pharmacy Consult for Heparin Indication:Ischemic leg  Allergies  Allergen Reactions  . Betadine [Povidone Iodine] Hives  . Sulfa Antibiotics   . Valium [Diazepam] Anxiety    Patient Measurements: Height: 5' (152.4 cm) Weight: 201 lb (91.173 kg) IBW/kg (Calculated) : 45.5  Vital Signs: Temp: 98.7 F (37.1 C) (04/05 0730) Temp Source: Oral (04/05 0730) BP: 90/50 mmHg (04/05 1200) Pulse Rate: 94 (04/05 1200)  Labs:  Recent Labs  07/28/15 1316  07/28/15 1514 07/29/15 0432 07/29/15 1308 07/29/15 1858 07/29/15 1958 07/30/15 0105 07/30/15 0327 07/30/15 0533 07/30/15 2205 07/31/15 0813 07/31/15 1158  HGB 13.4  --   --  12.3 14.5 12.3 11.2* 10.8*  --   --   --   --   --   HCT 40.1  --   --  36.2 43.9 36.4 32.7* 32.0*  --   --   --   --   --   PLT 181  --   --  167 213 203 204 195  --   --   --   --   --   APTT  --   < > 37*  --  89*  --   --   --   --   --  46* 31 30  LABPROT  --   --  15.0  --   --   --   --   --   --   --   --   --   --   INR  --   --  1.16  --   --   --   --   --   --   --   --   --   --   HEPARINUNFRC  --   --   --  1.26* 0.58  --   --   --   --   --   --   --   --   CREATININE 1.40*  --   --  1.24*  --   --   --   --   --   --   --   --   --   CKTOTAL 32*  --   --   --   --   --   --   --   --  215  --   --   --   TROPONINI  --   --   --   --   --   --   --   --  2.44*  --   --   --   --   < > = values in this interval not displayed.  Estimated Creatinine Clearance: 34 mL/min (by C-G formula based on Cr of 1.24).   Medical History: Past Medical History  Diagnosis Date  . Hypertension   . Thyroid disorder   . Lymphoma ([place])   . Hypothyroidism   . Arthritis   . Diverticulosis   . Obstructive sleep apnea   . Myasthenia gravis ([place])   . Hip fracture, left ([place])   . Fracture of right humerus   . Asthma     Medications:  Scheduled:  . Chlorhexidine Gluconate Cloth  6 each Topical Q0600  .  hydrocortisone sodium succinate  50 mg Intravenous Q6H  . mupirocin ointment  1 application Nasal BID  . piperacillin-tazobactam (ZOSYN)  IV  3.375 g Intravenous 3 times per day  . sodium chloride flush  3 mL Intravenous Q12H  . sodium chloride flush  3 mL Intravenous Q12H   Infusions:  . sodium chloride 10 mL/hr at 07/31/15 0700  . fentaNYL infusion INTRAVENOUS Stopped (07/30/15 0709)  . heparin 400 Units/hr (07/31/15 0115)  . phenylephrine (NEO-SYNEPHRINE) Adult infusion 40 mcg/min (07/31/15 1046)    Assessment: 82 y/o F s/p vascular procedure for ischemic leg on alteplase and heparin infusion. Per instructions from Dr. Delana Meyer, drip was to be restarted after surgery at 400 units/hr and tirtrated as follows: if APTT is <50 no change, if >50 hold drip 1 hr then resume at 100units/hr less than before.    Goal of Therapy:   Monitor platelets by anticoagulation protocol: Yes   Plan:  APTT remains < 50 so will continue heparin infusion at 400 units/hr. APTT every 4 hours while infusing per vascular.   Ulice Dash D 07/31/2015,12:50 PM

## 2015-07-31 NOTE — Progress Notes (Signed)
Patient's aPTT is under 50.  Pharmacy stated to restart Heparin at the original 400units/hr.  Will continue to monitor.

## 2015-07-31 NOTE — Consult Note (Signed)
[facility] Consultation      Name: [name] MRN: [id number] DOB: March 15, 1930    ADMISSION DATE:  07/28/2015  CHIEF COMPLAINT:   Acute esp failure-resolved   HISTORY OF PRESENT ILLNESS  Extubated yesterday, on vasopressors, noninvasive cuff pressures are inaccurate Manual BP with SBP>110 Patient alert and awake, follows commands asking about grand daughter   Review of Systems  Constitutional: Positive for malaise/fatigue. Negative for fever and chills.  HENT: Negative for congestion.   Eyes: Negative for blurred vision.  Respiratory: Negative for cough, hemoptysis, sputum production, shortness of breath and wheezing.   Cardiovascular: Negative for chest pain.  Gastrointestinal: Negative for heartburn.  Genitourinary: Negative for dysuria.  Musculoskeletal: Negative for myalgias.  Neurological: Negative for dizziness and headaches.  Psychiatric/Behavioral: The patient is nervous/anxious.       VITAL SIGNS    Temp:  [97.5 F (36.4 C)-98.7 F (37.1 C)] 98.7 F (37.1 C) (04/05 0730) Pulse Rate:  [64-134] 89 (04/05 0600) Resp:  [11-20] 11 (04/04 1100) BP: (76-118)/(34-85) 94/51 mmHg (04/05 0730) SpO2:  [91 %-100 %] 100 % (04/05 0730) FiO2 (%):  [35 %] 35 % (04/04 1145) HEMODYNAMICS: CVP:  [9 mmHg-50 mmHg] 50 mmHg VENTILATOR SETTINGS: Vent Mode:  [-] PRVC;Spontaneous FiO2 (%):  [35 %] 35 % PEEP:  [5 cmH20] 5 cmH20 Pressure Support:  [10 cmH20] 10 cmH20 INTAKE / OUTPUT:  Intake/Output Summary (Last 24 hours) at 07/31/15 0857 Last data filed at 07/31/15 0700  Gross per 24 hour  Intake 5244.15 ml  Output    905 ml  Net 4339.15 ml       PHYSICAL EXAM   Physical Exam  Constitutional: She is oriented to person, place, and time. No distress.  HENT:  Head: Normocephalic and atraumatic.  Eyes: Pupils are equal, round, and reactive to light. No scleral icterus.  Neck: Normal range of motion.  Cardiovascular: Normal rate and regular  rhythm.   No murmur heard. Pulmonary/Chest: No stridor. No respiratory distress. She has no wheezes. She has no rales.  Abdominal: Soft. She exhibits no distension. There is no tenderness.  Musculoskeletal: She exhibits no edema.  Wound vac in left leg, compressive device RT leg  Neurological: She is alert and oriented to person, place, and time. She displays normal reflexes. Coordination normal.  Skin: Skin is warm. No rash noted. She is not diaphoretic.       LABS   LABS:  CBC  Recent Labs Lab 07/29/15 1858 07/29/15 1958 07/30/15 0105  WBC 15.9* 14.5* 15.8*  HGB 12.3 11.2* 10.8*  HCT 36.4 32.7* 32.0*  PLT 203 204 195   Coag's  Recent Labs Lab 07/28/15 1514 07/29/15 1308 07/30/15 2205 07/31/15 0813  APTT 37* 89* 46* 31  INR 1.16  --   --   --    BMET  Recent Labs Lab 07/28/15 1316 07/29/15 0432  NA 132* 136  K 4.0 4.0  CL 98* 101  CO2 27 31  BUN 28* 29*  CREATININE 1.40* 1.24*  GLUCOSE 97 149*   Electrolytes  Recent Labs Lab 07/28/15 1316 07/29/15 0432  CALCIUM 8.9 8.7*   Sepsis Markers  Recent Labs Lab 07/30/15 0327  LATICACIDVEN 1.7   ABG  Recent Labs Lab 07/29/15 2315 07/30/15 0503  PHART 7.53* 7.34*  PCO2ART 33 52*  PO2ART 102 107   Liver Enzymes No results for input(s): AST, ALT, ALKPHOS, BILITOT, ALBUMIN in the last 168 hours. Cardiac Enzymes  Recent Labs Lab 07/30/15 0327  TROPONINI 2.44*   Glucose No results for input(s): GLUCAP in the last 168 hours.   Recent Results (from the past 240 hour(s))  MRSA PCR Screening     Status: Abnormal   Collection Time: 07/28/15 10:00 PM  Result Value Ref Range Status   MRSA by PCR POSITIVE (A) NEGATIVE Final    Comment:        The GeneXpert MRSA Assay (FDA approved for NASAL specimens only), is one component of a comprehensive MRSA colonization surveillance program. It is not intended to diagnose MRSA infection nor to guide or monitor treatment for MRSA  infections. CRITICAL RESULT CALLED TO, READ BACK BY AND VERIFIED WITH: CALLED TO LISA ROMERO @ [id number] ON 07/28/2015 BY CAF      Current facility-administered medications:  .  0.9 %  sodium chloride infusion, , Intravenous, Continuous, Algernon Huxley, MD, Last Rate: 10 mL/hr at 07/30/15 1449 .  acetaminophen (TYLENOL) tablet 650 mg, 650 mg, Oral, Q6H PRN **OR** acetaminophen (TYLENOL) suppository 650 mg, 650 mg, Rectal, Q6H PRN, Katha Cabal, MD .  albuterol (PROVENTIL) (2.5 MG/3ML) 0.083% nebulizer solution 3 mL, 3 mL, Inhalation, Q6H PRN, Katha Cabal, MD .  Chlorhexidine Gluconate Cloth 2 % PADS 6 each, 6 each, Topical, Q0600, Flora Lipps, MD, 6 each at 07/31/15 0600 .  fentaNYL (SUBLIMAZE) bolus via infusion 25 mcg, 25 mcg, Intravenous, Q1H PRN, Kara Mead V, MD .  fentaNYL 2575mcg in NS 247mL (39mcg/ml) infusion-PREMIX, 25-200 mcg/hr, Intravenous, Continuous, Kara Mead V, MD, Stopped at 07/30/15 [phone] .  heparin ADULT infusion 100 units/mL (25000 units/250 mL), 400 Units/hr, Intravenous, Continuous, Katha Cabal, MD, Last Rate: 4 mL/hr at 07/31/15 0115, 400 Units/hr at 07/31/15 0115 .  HYDROcodone-acetaminophen (NORCO/VICODIN) 5-325 MG per tablet 1-2 tablet, 1-2 tablet, Oral, Q4H PRN, Katha Cabal, MD .  hydrocortisone sodium succinate (SOLU-CORTEF) 100 MG injection 50 mg, 50 mg, Intravenous, Q6H, Raylene Miyamoto, MD, 50 mg at 07/31/15 0741 .  magnesium hydroxide (MILK OF MAGNESIA) suspension 30 mL, 30 mL, Oral, Daily PRN, Katha Cabal, MD .  midazolam (VERSED) injection 1 mg, 1 mg, Intravenous, Q1H PRN, Katha Cabal, MD .  midazolam (VERSED) injection 1 mg, 1 mg, Intravenous, Q15 min PRN, Kara Mead V, MD .  midazolam (VERSED) injection 1 mg, 1 mg, Intravenous, Q2H PRN, Kara Mead V, MD, 1 mg at 07/30/15 0241 .  morphine 2 MG/ML injection 2 mg, 2 mg, Intravenous, Q1H PRN, Katha Cabal, MD, 2 mg at 07/31/15 0131 .  mupirocin ointment (BACTROBAN) 2 % 1  application, 1 application, Nasal, BID, Flora Lipps, MD, 1 application at [phone] .  [DISCONTINUED] ondansetron (ZOFRAN) tablet 4 mg, 4 mg, Oral, Q6H PRN **OR** ondansetron (ZOFRAN) injection 4 mg, 4 mg, Intravenous, Q6H PRN, Katha Cabal, MD .  pantoprazole (PROTONIX) injection 40 mg, 40 mg, Intravenous, Q24H, Raylene Miyamoto, MD, 40 mg at 07/31/15 0239 .  phenylephrine (NEO-SYNEPHRINE) 10 mg in dextrose 5 % 250 mL (0.04 mg/mL) infusion, 30-200 mcg/min, Intravenous, Continuous, Raylene Miyamoto, MD, Last Rate: 69 mL/hr at 07/31/15 0700, 46 mcg/min at 07/31/15 0700 .  piperacillin-tazobactam (ZOSYN) IVPB 3.375 g, 3.375 g, Intravenous, 3 times per day, Raylene Miyamoto, MD, 3.375 g at 07/31/15 0552 .  sodium chloride flush (NS) 0.9 % injection 10-40 mL, 10-40 mL, Intracatheter, PRN, Katha Cabal, MD .  sodium chloride flush (NS) 0.9 % injection 3 mL, 3 mL, Intravenous, Q12H, Katha Cabal, MD, 3 mL  at 07/30/15 2200 .  sodium chloride flush (NS) 0.9 % injection 3 mL, 3 mL, Intravenous, Q12H, Katha Cabal, MD, 3 mL at 07/30/15 1052 .  sodium chloride flush (NS) 0.9 % injection 3 mL, 3 mL, Intravenous, PRN, Katha Cabal, MD .  sorbitol 70 % solution 30 mL, 30 mL, Oral, Daily PRN, Katha Cabal, MD  IMAGING    No results found.    INDWELLING DEVICES:: RT IJ CVL 4/3>>>>  MICRO DATA: MRSA PCR POS   ANTIMICROBIALS: zosyn>>>    ASSESSMENT/PLAN  82 yo white female admitted for post op resp failure with hypotension-sedation vs hypovolumia Successful extubation  PULMONARY 1.Respiratory Failure-resolved   CARDIOVASCULAR Shock-volume depleted,sedative related-patient with inaccurate cuff pressure I have instructed nursing staff to obtain manual cuff pressures which show SBP>110 -wean off vasopressors based on manual blood pressure  RENAL Follow chem 7 and UO, cont foley catheter  GASTROINTESTINAL Diet as tolerated  HEMATOLOGIC Follow  CBC Transfusion guidelines  INFECTIOUS Empiric abx   ENDOCRINE Follow FSBS     I have personally obtained a history, examined the patient, evaluated laboratory and independently reviewed  imaging results, formulated the assessment and plan and placed orders.   prognosis is guarded.   Corrin Parker, M.D.  [facility]  Medical Director [place] Director [facility]io-P[facility] Department

## 2015-07-31 NOTE — Progress Notes (Addendum)
ANTICOAGULATION CONSULT NOTE - Initial Consult  Pharmacy Consult for Heparin Indication:Ischemic leg  Allergies  Allergen Reactions  . Betadine [Povidone Iodine] Hives  . Sulfa Antibiotics   . Valium [Diazepam] Anxiety    Patient Measurements: Height: 5' (152.4 cm) Weight: 201 lb (91.173 kg) IBW/kg (Calculated) : 45.5  Vital Signs: Temp: 98.6 F (37 C) (04/05 1400) Temp Source: Axillary (04/05 1400) BP: 110/62 mmHg (04/05 2019) Pulse Rate: 87 (04/05 1500)  Labs:  Recent Labs  07/29/15 0432 07/29/15 1308  07/29/15 1958 07/30/15 0105 07/30/15 0327 07/30/15 0533  07/31/15 1158 07/31/15 1607 07/31/15 2053  HGB 12.3 14.5  < > 11.2* 10.8*  --   --   --  8.3*  --   --   HCT 36.2 43.9  < > 32.7* 32.0*  --   --   --  25.3*  --   --   PLT 167 213  < > 204 195  --   --   --  222  --   --   APTT  --  89*  --   --   --   --   --   < > 30 30 31   HEPARINUNFRC 1.26* 0.58  --   --   --   --   --   --   --   --   --   CREATININE 1.24*  --   --   --   --   --   --   --   --   --   --   CKTOTAL  --   --   --   --   --   --  215  --   --   --   --   TROPONINI  --   --   --   --   --  2.44*  --   --   --   --   --   < > = values in this interval not displayed.  Estimated Creatinine Clearance: 34 mL/min (by C-G formula based on Cr of 1.24).   Medical History: Past Medical History  Diagnosis Date  . Hypertension   . Thyroid disorder   . Lymphoma ([place])   . Hypothyroidism   . Arthritis   . Diverticulosis   . Obstructive sleep apnea   . Myasthenia gravis ([place])   . Hip fracture, left ([place])   . Fracture of right humerus   . Asthma     Medications:  Scheduled:  . Chlorhexidine Gluconate Cloth  6 each Topical Q0600  . hydrocortisone sodium succinate  50 mg Intravenous Q6H  . mupirocin ointment  1 application Nasal BID  . piperacillin-tazobactam (ZOSYN)  IV  3.375 g Intravenous 3 times per day  . QUEtiapine  25 mg Oral TID  . sodium chloride flush  3 mL Intravenous Q12H  .  sodium chloride flush  3 mL Intravenous Q12H   Infusions:  . sodium chloride 10 mL/hr at 07/31/15 0700  . heparin 400 Units/hr (07/31/15 0115)  . phenylephrine (NEO-SYNEPHRINE) Adult infusion 30 mcg/min (07/31/15 1800)    Assessment: 82 y/o F s/p vascular procedure for ischemic leg on alteplase and heparin infusion. Per instructions from Dr. Delana Meyer, drip was to be restarted after surgery at 400 units/hr and tirtrated as follows: if APTT is <50 no change, if >50 hold drip 1 hr then resume at 100units/hr less than before.    Goal of Therapy:   Monitor platelets by anticoagulation  protocol: Yes   Plan:  APTT remains < 50 so will continue heparin infusion at 400 units/hr. APTT every 4 hours while infusing per vascular.  4/5 00:00 aPTT 34. Continue current regimen.   Robbins,Jason D 07/31/2015,9:43 PM

## 2015-07-31 NOTE — Progress Notes (Signed)
ANTICOAGULATION CONSULT NOTE - Initial Consult  Pharmacy Consult for Heparin Indication:Ischemic leg  Allergies  Allergen Reactions  . Betadine [Povidone Iodine] Hives  . Sulfa Antibiotics   . Valium [Diazepam] Anxiety    Patient Measurements: Height: 5' (152.4 cm) Weight: 201 lb (91.173 kg) IBW/kg (Calculated) : 45.5  Vital Signs: Temp: 98.1 F (36.7 C) (04/05 0000) Temp Source: Axillary (04/05 0000) BP: 113/85 mmHg (04/05 0600) Pulse Rate: 89 (04/05 0600)  Labs:  Recent Labs  07/28/15 1316 07/28/15 1514 07/29/15 0432 07/29/15 1308 07/29/15 1858 07/29/15 1958 07/30/15 0105 07/30/15 0327 07/30/15 0533 07/30/15 2205  HGB 13.4  --  12.3 14.5 12.3 11.2* 10.8*  --   --   --   HCT 40.1  --  36.2 43.9 36.4 32.7* 32.0*  --   --   --   PLT 181  --  167 213 203 204 195  --   --   --   APTT  --  37*  --  89*  --   --   --   --   --  46*  LABPROT  --  15.0  --   --   --   --   --   --   --   --   INR  --  1.16  --   --   --   --   --   --   --   --   HEPARINUNFRC  --   --  1.26* 0.58  --   --   --   --   --   --   CREATININE 1.40*  --  1.24*  --   --   --   --   --   --   --   CKTOTAL 32*  --   --   --   --   --   --   --  215  --   TROPONINI  --   --   --   --   --   --   --  2.44*  --   --     Estimated Creatinine Clearance: 34 mL/min (by C-G formula based on Cr of 1.24).   Medical History: Past Medical History  Diagnosis Date  . Hypertension   . Thyroid disorder   . Lymphoma ([place])   . Hypothyroidism   . Arthritis   . Diverticulosis   . Obstructive sleep apnea   . Myasthenia gravis ([place])   . Hip fracture, left ([place])   . Fracture of right humerus   . Asthma     Medications:  Scheduled:  . Chlorhexidine Gluconate Cloth  6 each Topical Q0600  . hydrocortisone sodium succinate  50 mg Intravenous Q6H  . mupirocin ointment  1 application Nasal BID  . pantoprazole (PROTONIX) IV  40 mg Intravenous Q24H  . piperacillin-tazobactam (ZOSYN)  IV  3.375 g  Intravenous 3 times per day  . sodium chloride flush  3 mL Intravenous Q12H  . sodium chloride flush  3 mL Intravenous Q12H   Infusions:  . sodium chloride 10 mL/hr at 07/30/15 1449  . fentaNYL infusion INTRAVENOUS Stopped (07/30/15 0709)  . heparin 400 Units/hr (07/31/15 0115)  . phenylephrine (NEO-SYNEPHRINE) Adult infusion 46 mcg/min (07/31/15 0205)    Assessment: 82 y/o F s/p vascular procedure for ischemic leg on alteplase and heparin infusion. Per instructions from Dr. Delana Meyer, drip was to be restarted after surgery at 400 units/hr and tirtrated as follows: if  APTT is <50 no change, if >50 hold drip 1 hr then resume at 100units/hr less than before. Check APTT q 4 hours. Drip was resumed after surgery at 14.79ml/hr instead of the 4 ml. Spoke with RN, will stop drip and ordered stat APTT (~1745). However it was drawn from the central line, so had to cancel lab. Redraw around 2000. Drip still off until lab results.   4/4 2200 aPTT 46. Drip restarted at 400 units/hr. Recheck aPTT in AM.  Goal of Therapy:   Monitor platelets by anticoagulation protocol: Yes   Plan:  Will f/u with APTT   [name] S 07/31/2015,7:05 AM

## 2015-07-31 NOTE — Progress Notes (Signed)
eLink Physician-Brief Progress Note Patient Name: [name] DOB: 1929/10/01 MRN: [id number]   Date of Service  07/31/2015  HPI/Events of Note    eICU Interventions  Resume seroquel - home dose Cleaned up orders/ dc'd Iv sedatives     Intervention Category Intermediate Interventions: Medication change / dose adjustment  Clint Biello V. 07/31/2015, 8:43 PM

## 2015-07-31 NOTE — Clinical Social Work Note (Signed)
Clinical Social Work Assessment  Patient Details  Name: RAELYN SCARSELLA MRN: [id number] Date of Birth: 1929/08/17  Date of referral:  07/31/15               Reason for consult:  Facility Placement                Permission sought to share information with:   (patient a poor historian) Permission granted to share information::     Name::        Agency::     Relationship::     Contact Information:     Housing/Transportation Living arrangements for the past 2 months:  [place] of Information:  Adult Children Patient Interpreter Needed:  None Criminal Activity/Legal Involvement Pertinent to Current Situation/Hospitalization:  No - Comment as needed Significant Relationships:  Adult Children Lives with:  Facility Resident Do you feel safe going back to the place where you live?    Need for family participation in patient care:  Yes (Comment)  Care giving concerns:  Patient resides at [facility].    Social Worker assessment / plan:  CSW consulted due to patient being from [facility]. CSW attempted to reach [employer], the Resident Care Coordinator, but was told by Jonelle Sidle at [facility] that she was out today and that there was no one covering for her. Patient unable to provide information currently and nurse reporting that patient thinks she is pregnant and thinks each staff member is pregnant. CSW contacted patient's daughter: Tharon Aquas: [phone]. Ms. Randel Pigg reports that patient has been at [facility] for about a year. She reports that patient has been able to ambulate very small distances but would need assistance getting out of her bed and getting down to the dining hall. She reports staff was having to use a wheelchair to mobilize patient and as of this past week, patient has been unable to ambulate at all. She says that Dr. Delana Meyer has recommended STR after this hospitalization and she is open to this. She states that patient has been at [facility] before but  she would like to see what other offers there might be. CSW to initiate bedsearch today.  Employment status:  Retired Nurse, adult PT Recommendations:  Not assessed at this time Information / Referral to community resources:     Patient/Family's Response to care:  Patient's daughter expressed appreciation for CSW assistance.  Patient/Family's Understanding of and Emotional Response to Diagnosis, Current Treatment, and Prognosis:  Patient's daughter is leaning toward rehab at discharge rather than return to [facility].  Emotional Assessment Appearance:  Appears stated age Attitude/Demeanor/Rapport:   (patient currently pleasant but confused) Affect (typically observed):    Orientation:  Oriented to Self Alcohol / Substance use:  Not Applicable Psych involvement (Current and /or in the community):  No (Comment)  Discharge Needs  Concerns to be addressed:  Care Coordination Readmission within the last 30 days:  No Current discharge risk:  None Barriers to Discharge:  No Barriers Identified   Shela Leff, LCSW 07/31/2015, 11:16 AM

## 2015-08-01 ENCOUNTER — Inpatient Hospital Stay: Payer: PPO

## 2015-08-01 ENCOUNTER — Encounter: Payer: Self-pay | Admitting: Student

## 2015-08-01 DIAGNOSIS — I959 Hypotension, unspecified: Secondary | ICD-10-CM

## 2015-08-01 DIAGNOSIS — I480 Paroxysmal atrial fibrillation: Secondary | ICD-10-CM

## 2015-08-01 DIAGNOSIS — I214 Non-ST elevation (NSTEMI) myocardial infarction: Secondary | ICD-10-CM | POA: Insufficient documentation

## 2015-08-01 LAB — CBC
HEMATOCRIT: 24.8 % — AB (ref 35.0–47.0)
Hemoglobin: 8.2 g/dL — ABNORMAL LOW (ref 12.0–16.0)
MCH: 30.2 pg (ref 26.0–34.0)
MCHC: 32.9 g/dL (ref 32.0–36.0)
MCV: 91.8 fL (ref 80.0–100.0)
Platelets: 223 10*3/uL (ref 150–440)
RBC: 2.7 MIL/uL — ABNORMAL LOW (ref 3.80–5.20)
RDW: 14.5 % (ref 11.5–14.5)
WBC: 11.5 10*3/uL — ABNORMAL HIGH (ref 3.6–11.0)

## 2015-08-01 LAB — BASIC METABOLIC PANEL
Anion gap: 0 — ABNORMAL LOW (ref 5–15)
BUN: 32 mg/dL — AB (ref 6–20)
CALCIUM: 8 mg/dL — AB (ref 8.9–10.3)
CHLORIDE: 108 mmol/L (ref 101–111)
CO2: 30 mmol/L (ref 22–32)
CREATININE: 1.58 mg/dL — AB (ref 0.44–1.00)
GFR calc non Af Amer: 29 mL/min — ABNORMAL LOW (ref >60)
GFR, EST AFRICAN AMERICAN: 34 mL/min — AB (ref >60)
GLUCOSE: 157 mg/dL — AB (ref 65–99)
Potassium: 2.8 mmol/L — CL (ref 3.5–5.1)
Sodium: 138 mmol/L (ref 135–145)

## 2015-08-01 LAB — ECHOCARDIOGRAM COMPLETE
HEIGHTINCHES: 60 in
Weight: 3216 oz

## 2015-08-01 LAB — APTT
APTT: 34 s (ref 24–36)
APTT: 29 s (ref 24–36)

## 2015-08-01 LAB — TROPONIN I: Troponin I: 0.26 ng/mL — ABNORMAL HIGH (ref <0.031)

## 2015-08-01 MED ORDER — SODIUM CHLORIDE 0.9 % IV BOLUS (SEPSIS): 1000.0000 mL | Freq: Once | INTRAVENOUS | Status: DC

## 2015-08-01 MED ORDER — POLYETHYLENE GLYCOL 3350 17 G PO PACK: 17.0000 g | PACK | Freq: Every day | ORAL | Status: DC | PRN

## 2015-08-01 MED ORDER — QUETIAPINE FUMARATE 25 MG PO TABS
25.0000 mg | ORAL_TABLET | Freq: Every day | ORAL | Status: DC
  Administered 2015-08-02 – 2015-08-06 (×5): 25 mg via ORAL
  Filled 2015-08-01 (×5): qty 1

## 2015-08-01 MED ORDER — DOCUSATE SODIUM 100 MG PO CAPS
100.0000 mg | ORAL_CAPSULE | Freq: Two times a day (BID) | ORAL | Status: DC
  Administered 2015-08-01 – 2015-08-05 (×9): 100 mg via ORAL
  Filled 2015-08-01 (×9): qty 1

## 2015-08-01 MED ORDER — SODIUM CHLORIDE 0.9 % IV BOLUS (SEPSIS)
1000.0000 mL | Freq: Once | INTRAVENOUS | Status: AC
  Administered 2015-08-01: 1000 mL via INTRAVENOUS

## 2015-08-01 MED ORDER — POTASSIUM CHLORIDE CRYS ER 20 MEQ PO TBCR
40.0000 meq | EXTENDED_RELEASE_TABLET | ORAL | Status: AC
  Administered 2015-08-02 (×2): 40 meq via ORAL
  Filled 2015-08-01 (×2): qty 2

## 2015-08-01 MED ORDER — BACITRACIN 500 UNIT/GM EX OINT
1.0000 "application " | TOPICAL_OINTMENT | Freq: Two times a day (BID) | CUTANEOUS | Status: DC
  Administered 2015-08-01 – 2015-08-07 (×13): 1 via TOPICAL
  Filled 2015-08-01 (×15): qty 0.9

## 2015-08-01 MED ORDER — LEVOTHYROXINE SODIUM 75 MCG PO TABS
75.0000 ug | ORAL_TABLET | Freq: Every day | ORAL | Status: DC
  Administered 2015-08-02 – 2015-08-05 (×4): 75 ug via ORAL
  Filled 2015-08-01 (×4): qty 1

## 2015-08-01 NOTE — Progress Notes (Signed)
[facility] Progress Note Patient Name: [name] DOB: [birth year] MRN: [id number]   Date of Service  08/01/2015  HPI/Events of Note  Hypokalemia  eICU Interventions  Potassium replaced     Intervention Category Intermediate Interventions: Electrolyte abnormality - evaluation and management  DETERDING,ELIZABETH 08/01/2015, 11:37 PM

## 2015-08-01 NOTE — Progress Notes (Signed)
Pharmacy Antibiotic Note  [name] is a 82 y.o. female admitted on 07/28/2015 with wound infection.  Pharmacy has been consulted for Zosyn dosing.  Plan: Continue Zosyn 3.375 grams q 8 h EI.   Height: 5' (152.4 cm) Weight: 208 lb 8.9 oz (94.6 kg) IBW/kg (Calculated) : 45.5  Temp (24hrs), Avg:97.7 F (36.5 C), Min:97.1 F (36.2 C), Max:98.6 F (37 C)   Recent Labs Lab 07/28/15 1316 07/29/15 0432  07/29/15 1858 07/29/15 1958 07/30/15 0105 07/30/15 0327 07/31/15 1158 08/01/15 0740  WBC 11.2* 8.9  < > 15.9* 14.5* 15.8*  --  13.3* 11.5*  CREATININE 1.40* 1.24*  --   --   --   --   --   --   --   LATICACIDVEN  --   --   --   --   --   --  1.7  --   --   < > = values in this interval not displayed.  Estimated Creatinine Clearance: 34.7 mL/min (by C-G formula based on Cr of 1.24).    Allergies  Allergen Reactions  . Betadine [Povidone Iodine] Hives  . Sulfa Antibiotics   . Valium [Diazepam] Anxiety    Antimicrobials this admission: Cefuroxime  4/4 >> 4/4 Zosyn 4/4  >>   Dose adjustments this admission:   Microbiology results: 4/2 MRSA PCR: (+)  Thank you for allowing pharmacy to be a part of this patient's care.  Vena Rua 08/01/2015 8:29 AM

## 2015-08-01 NOTE — Consult Note (Signed)
[facility] Consultation      Name: [name] MRN: [id number] DOB: 09/04/1942    ADMISSION DATE:  07/28/2015  CHIEF COMPLAINT:   Acute resp failure-resolved   HISTORY OF PRESENT ILLNESS  Extubated, remains  on vasopressors, noninvasive cuff pressures are inaccurate Patient alert and awake, follows commands asking about grand daughter intermittant confusion, lethargy CVP 6 will give 1 liter NS bolus   Review of Systems  Constitutional: Positive for malaise/fatigue. Negative for fever and chills.  HENT: Negative for congestion.   Eyes: Negative for blurred vision.  Respiratory: Negative for cough, hemoptysis, sputum production, shortness of breath and wheezing.   Cardiovascular: Negative for chest pain.  Gastrointestinal: Negative for heartburn.  Genitourinary: Negative for dysuria.  Musculoskeletal: Negative for myalgias.  Neurological: Negative for dizziness and headaches.  Psychiatric/Behavioral: The patient is nervous/anxious.       VITAL SIGNS    Temp:  [97.1 F (36.2 C)-98.7 F (37.1 C)] 98.7 F (37.1 C) (04/06 0800) Pulse Rate:  [84-101] 84 (04/06 1100) Resp:  [15-29] 15 (04/06 1100) BP: (87-139)/(51-114) 87/56 mmHg (04/06 1100) SpO2:  [74 %-100 %] 95 % (04/06 1100) Weight:  [208 lb 8.9 oz (94.6 kg)] 208 lb 8.9 oz (94.6 kg) (04/06 0606) HEMODYNAMICS: CVP:  [6 mmHg-13 mmHg] 6 mmHg VENTILATOR SETTINGS:   INTAKE / OUTPUT:  Intake/Output Summary (Last 24 hours) at 08/01/15 1314 Last data filed at 08/01/15 0900  Gross per 24 hour  Intake 1232.48 ml  Output    600 ml  Net 632.48 ml       PHYSICAL EXAM   Physical Exam  Constitutional: She is oriented to person, place, and time. No distress.  HENT:  Head: Normocephalic and atraumatic.  Eyes: Pupils are equal, round, and reactive to light. No scleral icterus.  Neck: Normal range of motion.  Cardiovascular: Normal rate and regular rhythm.   No murmur  heard. Pulmonary/Chest: No stridor. No respiratory distress. She has no wheezes. She has no rales.  Abdominal: Soft. She exhibits no distension. There is no tenderness.  Musculoskeletal: She exhibits no edema.  Wound vac in left leg, compressive device RT leg  Neurological: She is alert and oriented to person, place, and time. She displays normal reflexes. Coordination normal.  Skin: Skin is warm. No rash noted. She is not diaphoretic.       LABS   LABS:  CBC  Recent Labs Lab 07/30/15 0105 07/31/15 1158 08/01/15 0740  WBC 15.8* 13.3* 11.5*  HGB 10.8* 8.3* 8.2*  HCT 32.0* 25.3* 24.8*  PLT 195 222 223   Coag's  Recent Labs Lab 07/28/15 1514  07/31/15 2053 08/01/15 0039 08/01/15 0740  APTT 37*  < > 31 34 29  INR 1.16  --   --   --   --   < > = values in this interval not displayed. BMET  Recent Labs Lab 07/28/15 1316 07/29/15 0432  NA 132* 136  K 4.0 4.0  CL 98* 101  CO2 27 31  BUN 28* 29*  CREATININE 1.40* 1.24*  GLUCOSE 97 149*   Electrolytes  Recent Labs Lab 07/28/15 1316 07/29/15 0432  CALCIUM 8.9 8.7*   Sepsis Markers  Recent Labs Lab 07/30/15 0327  LATICACIDVEN 1.7   ABG  Recent Labs Lab 07/29/15 2315 07/30/15 0503  PHART 7.53* 7.34*  PCO2ART 33 52*  PO2ART 102 107   Liver Enzymes No results for input(s): AST, ALT, ALKPHOS, BILITOT, ALBUMIN in the last 168 hours. Cardiac Enzymes  Recent Labs Lab 07/30/15 0327  TROPONINI 2.44*   Glucose No results for input(s): GLUCAP in the last 168 hours.   Recent Results (from the past 240 hour(s))  MRSA PCR Screening     Status: Abnormal   Collection Time: 07/28/15 10:00 PM  Result Value Ref Range Status   MRSA by PCR POSITIVE (A) NEGATIVE Final    Comment:        The GeneXpert MRSA Assay (FDA approved for NASAL specimens only), is one component of a comprehensive MRSA colonization surveillance program. It is not intended to diagnose MRSA infection nor to guide or monitor  treatment for MRSA infections. CRITICAL RESULT CALLED TO, READ BACK BY AND VERIFIED WITH: CALLED TO LISA ROMERO @ [id number] ON 07/28/2015 BY CAF      Current facility-administered medications:  .  0.9 %  sodium chloride infusion, , Intravenous, Continuous, Algernon Huxley, MD, Last Rate: 10 mL/hr at 07/31/15 0700 .  acetaminophen (TYLENOL) tablet 650 mg, 650 mg, Oral, Q6H PRN **OR** acetaminophen (TYLENOL) suppository 650 mg, 650 mg, Rectal, Q6H PRN, Katha Cabal, MD .  albuterol (PROVENTIL) (2.5 MG/3ML) 0.083% nebulizer solution 3 mL, 3 mL, Inhalation, Q6H PRN, Katha Cabal, MD .  bacitracin ointment 1 application, 1 application, Topical, BID, Katha Cabal, MD, 1 application at [id number] .  Chlorhexidine Gluconate Cloth 2 % PADS 6 each, 6 each, Topical, Q0600, Flora Lipps, MD, 6 each at 08/01/15 0540 .  docusate sodium (COLACE) capsule 100 mg, 100 mg, Oral, BID, Flora Lipps, MD, 100 mg at 08/01/15 1300 .  fentaNYL (SUBLIMAZE) bolus via infusion 25 mcg, 25 mcg, Intravenous, Q1H PRN, Kara Mead V, MD .  heparin ADULT infusion 100 units/mL (25000 units/250 mL), 400 Units/hr, Intravenous, Continuous, Katha Cabal, MD, Last Rate: 4 mL/hr at 08/01/15 0900, 400 Units/hr at 08/01/15 0900 .  HYDROcodone-acetaminophen (NORCO/VICODIN) 5-325 MG per tablet 1-2 tablet, 1-2 tablet, Oral, Q4H PRN, Katha Cabal, MD .  hydrocortisone sodium succinate (SOLU-CORTEF) 100 MG injection 50 mg, 50 mg, Intravenous, Q6H, Raylene Miyamoto, MD, 50 mg at 08/01/15 [phone] .  [START ON 08/02/2015] levothyroxine (SYNTHROID, LEVOTHROID) tablet 75 mcg, 75 mcg, Oral, QAC breakfast, Flora Lipps, MD .  magnesium hydroxide (MILK OF MAGNESIA) suspension 30 mL, 30 mL, Oral, Daily PRN, Katha Cabal, MD .  morphine 2 MG/ML injection 2 mg, 2 mg, Intravenous, Q1H PRN, Katha Cabal, MD, 2 mg at 07/31/15 0131 .  mupirocin ointment (BACTROBAN) 2 % 1 application, 1 application, Nasal, BID, Flora Lipps, MD, 1  application at [id number] .  [DISCONTINUED] ondansetron (ZOFRAN) tablet 4 mg, 4 mg, Oral, Q6H PRN **OR** ondansetron (ZOFRAN) injection 4 mg, 4 mg, Intravenous, Q6H PRN, Katha Cabal, MD .  phenylephrine (NEO-SYNEPHRINE) 10 mg in dextrose 5 % 250 mL (0.04 mg/mL) infusion, 30-200 mcg/min, Intravenous, Continuous, Raylene Miyamoto, MD, Last Rate: 15 mL/hr at 08/01/15 1227, 10 mcg/min at 08/01/15 1227 .  piperacillin-tazobactam (ZOSYN) IVPB 3.375 g, 3.375 g, Intravenous, 3 times per day, Raylene Miyamoto, MD, 3.375 g at 08/01/15 0547 .  polyethylene glycol (MIRALAX / GLYCOLAX) packet 17 g, 17 g, Oral, Daily PRN, Flora Lipps, MD .  Derrill Memo ON 08/02/2015] QUEtiapine (SEROQUEL) tablet 25 mg, 25 mg, Oral, QHS, Flora Lipps, MD .  sodium chloride 0.9 % bolus 1,000 mL, 1,000 mL, Intravenous, Once, Flora Lipps, MD .  sodium chloride flush (NS) 0.9 % injection 10-40 mL, 10-40 mL, Intracatheter, PRN, Katha Cabal, MD .  sodium chloride flush (NS) 0.9 % injection 3 mL, 3 mL, Intravenous, Q12H, Katha Cabal, MD, 3 mL at 07/31/15 2132 .  sodium chloride flush (NS) 0.9 % injection 3 mL, 3 mL, Intravenous, Q12H, Katha Cabal, MD, 3 mL at 07/31/15 2132 .  sodium chloride flush (NS) 0.9 % injection 3 mL, 3 mL, Intravenous, PRN, Katha Cabal, MD .  sorbitol 70 % solution 30 mL, 30 mL, Oral, Daily PRN, Katha Cabal, MD, 30 mL at 07/31/15 1639  IMAGING    Ct Head Wo Contrast  08/01/2015  CLINICAL DATA:  Recent mental status changes.  Confusion. EXAM: CT HEAD WITHOUT CONTRAST TECHNIQUE: Contiguous axial images were obtained from the base of the skull through the vertex without intravenous contrast. COMPARISON:  09/12/2014.  09/11/2014. FINDINGS: There is generalized brain atrophy. There chronic small-vessel ischemic changes affecting the deep white matter. No sign of acute infarction. No mass lesion, hemorrhage or extra-axial collection. Chronic ventriculomegaly is felt secondary to central  atrophy. This appears unchanged. No calvarial abnormality. Sinuses, middle ears and mastoids are clear. IMPRESSION: No acute or reversible finding. Atrophy and chronic small-vessel ischemic changes of the deep white matter. Chronic ventriculomegaly felt secondary to central atrophy. Electronically Signed   By: Nelson Chimes M.D.   On: 08/01/2015 11:36      INDWELLING DEVICES:: RT IJ CVL 4/3>>>>  MICRO DATA: MRSA PCR POS   ANTIMICROBIALS: zosyn>>>    ASSESSMENT/PLAN  82 yo white female admitted for post op resp failure with hypotension-sedation vs hypovolumia Successful extubation  PULMONARY 1.Respiratory Failure-resolved   CARDIOVASCULAR Shock-volume depleted,sedative related-patient with inaccurate cuff pressure I have instructed nursing staff to obtain manual cuff pressures which show SBP>110 -wean off vasopressors based on manual blood pressure -CVP 6 will give saline bolus   RENAL Follow chem 7 and UO, cont foley catheter  GASTROINTESTINAL Diet as tolerated  HEMATOLOGIC Follow CBC Transfusion guidelines  INFECTIOUS Empiric abx   ENDOCRINE Follow FSBS     I have personally obtained a history, examined the patient, evaluated laboratory and independently reviewed  imaging results, formulated the assessment and plan and placed orders.   prognosis is guarded. Remains step down status   Corrin Parker, M.D.  [facility]  Medical Director [place] Director [facility] Cardio-Pulmonary Department

## 2015-08-01 NOTE — Consult Note (Signed)
Cardiology Consult    Patient ID: [name] MRN: [id number], DOB/AGE: [birth year]   Admit date: 07/28/2015 Date of Consult: 08/01/2015  Primary Physician: Sarajane Jews, MD Reason for Consult: Paroxysmal Atrial Fibrillation Primary Cardiologist: New to [facility] Requesting Provider: Dr. Delana Meyer   History of Present Illness    [name] is a 82 y.o. female with past medical history of HTN, Hypothyroidism, Myasthenia Gravis, OSA, and Diverticulosis who presented to [facility] on 07/28/2015 for evaluation of left leg pain.   She was admitted for concern of ischemic leg. US showed no evidence of DVT's. Vascular surgery admitted the patient and she underwent angiography on 07/29/2015. Angiography showed near occlusion of the common iliac on the right and occlusion of the entire common iliac on the left. Thrombolysis was initiated with TPA via bilateral groin access. She was started on Heparin as well. She later underwent open embolectomy of the left iliofemoral system that same day.  She required full vent support following the procedure but was able to be extubated on 07/30/2015. Hgb is being followed closely, as it was 13.4 pre-op but is currently at 8.2 on 08/01/2015. She has required pressor support but this is currently being weaned and she remains on Neo-synephrine 20 mcg/min at this time.  At the time of this encounter, she is arousable but unable to elaborate on her medical history. Reports mild pain in her left leg. Denies any chest pain or shortness of breath. No family members are at the bedside to question about her history of atrial fibrillation or prior cardiac history.   Her echocardiogram shows an EF of 35% to 40% with hypokinesis of the anterior myocardium and apical myocardium. Her telemetry shows NSR with frequent PAC's. No evidence of atrial fibrillation.   A history of atrial fibrillation is noted in her chart at 08/2014 which was confirmed with an EKG. Notes that admission state  "this is likely chronic and patient refused cardiac workup in the past". Her EKG's this admission show sinus rhythm with PAC's.    Past Medical History   Past Medical History  Diagnosis Date  . Hypertension   . Thyroid disorder   . Lymphoma ([place])   . Hypothyroidism   . Arthritis   . Diverticulosis   . Obstructive sleep apnea   . Myasthenia gravis ([place])   . Hip fracture, left ([place])   . Fracture of right humerus   . Asthma     Past Surgical History  Procedure Laterality Date  . Nm pet lymphoma    . Hernia repair    . Cholecystectomy    . Back surgery    . Embolectomy Left 07/29/2015    Procedure: EMBOLECTOMY/ Left Femoral;  Surgeon: Katha Cabal, MD;  Location: [facility] ORS;  Service: Vascular;  Laterality: Left;  . Peripheral vascular catheterization Bilateral 07/29/2015    Procedure: Lower Extremity Angiography;  Surgeon: Katha Cabal, MD;  Location: [place] [facility];  Service: Cardiovascular;  Laterality: Bilateral;  . Peripheral vascular catheterization  07/29/2015    Procedure: Lower Extremity Intervention;  Surgeon: Katha Cabal, MD;  Location: [place] [facility];  Service: Cardiovascular;;  . Peripheral vascular catheterization N/A 07/29/2015    Procedure: Lower Extremity Angiography;  Surgeon: Algernon Huxley, MD;  Location: [place] [facility];  Service: Cardiovascular;  Laterality: N/A;  . Peripheral vascular catheterization  07/29/2015    Procedure: Lower Extremity Intervention;  Surgeon: Algernon Huxley, MD;  Location: [place] [facility];  Service: Cardiovascular;;     Allergies  Allergies  Allergen Reactions  . Betadine [Povidone Iodine] Hives  . Sulfa Antibiotics   . Valium [Diazepam] Anxiety    Inpatient Medications    . bacitracin  1 application Topical BID  . Chlorhexidine Gluconate Cloth  6 each Topical Q0600  . hydrocortisone sodium succinate  50 mg Intravenous Q6H  . mupirocin ointment  1 application Nasal BID  . piperacillin-tazobactam (ZOSYN)   IV  3.375 g Intravenous 3 times per day  . QUEtiapine  25 mg Oral TID  . sodium chloride flush  3 mL Intravenous Q12H  . sodium chloride flush  3 mL Intravenous Q12H    Family History    Family History  Problem Relation Age of Onset  . Heart Problems Mother   . Cancer Father     Social History    Social History   Social History  . Marital Status: Widowed    Spouse Name: N/A  . Number of Children: 4  . 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 Smoking status: Never Smoker   . Smokeless tobacco: Never Used  . Alcohol Use: No  . Drug Use: No  . Sexual Activity: Not on file   Other Topics Concern  . Not on file   Social History Narrative   Patient lives at home with son.   Patient has four children.   Patient is retired.   Patient has a high school education.   Patient is right-handed.   Patient drinks very little caffeine.      Review of Systems    General:  No chills, fever, night sweats or weight changes. Positive for left leg pain. Cardiovascular:  No chest pain, dyspnea on exertion, edema, orthopnea, palpitations, paroxysmal nocturnal dyspnea. Dermatological: No rash, lesions/masses Respiratory: No cough, dyspnea Urologic: No hematuria, dysuria Abdominal:   No nausea, vomiting, diarrhea, bright red blood per rectum, melena, or hematemesis Neurologic:  No visual changes, wkns, changes in mental status. All other systems reviewed and are otherwise negative except as noted above.  Physical Exam    Blood pressure 116/61, pulse 87, temperature 98.7 F (37.1 C), temperature source Axillary, resp. rate 17, height 5' (1.524 m), weight 208 lb 8.9 oz (94.6 kg), SpO2 93 %.  General: Elderly Caucasian female appearing in NAD. Arousable, but quickly falls back asleep. Psych: Normal affect. Neuro: Alert and oriented X 2 (person, place). Moves all extremities spontaneously. HEENT: Normal  Neck: Supple  without bruits or JVD. Lungs:  Resp regular and unlabored, no wheezing or rales appreciated. Heart: RRR no s3, s4, or murmurs. Abdomen: Soft, non-tender, non-distended, BS + x 4.  Extremities: No clubbing, cyanosis or edema. DP/PT/Radials 1+ and equal bilaterally. Drain in place on the left.   Labs    Troponin (Point of Care Test) No results for input(s): TROPIPOC in the last 72 hours.  Recent Labs  07/30/15 0327 07/30/15 0533  CKTOTAL  --  215  TROPONINI 2.44*  --    Lab Results  Component Value Date   WBC 11.5* 08/01/2015   HGB 8.2* 08/01/2015   HCT 24.8* 08/01/2015   MCV 91.8 08/01/2015   PLT 223 08/01/2015    Recent Labs Lab 07/29/15 0432  NA 136  K 4.0  CL 101  CO2 31  BUN 29*  CREATININE 1.24*  CALCIUM 8.7*  GLUCOSE 149*   Lab Results  Component  Value Date   CHOL 254* 07/08/2011   HDL 32* 07/08/2011   LDLCALC 174* 07/08/2011   TRIG 242* 07/08/2011   No results found for: [facility]   Radiology Studies    Dg Lumbar Spine 2-3 Views: 07/28/2015  CLINICAL DATA:  Low back and left hip pain.  No time course given. EXAM: LUMBAR SPINE - 2-3 VIEW; DG HIP (WITH OR WITHOUT PELVIS) 2-3V LEFT COMPARISON:  CT scan 09/19/2014 FINDINGS: Lumbar spine: Left convex lumbar scoliosis and degenerative lumbar spondylosis. Stable vertebral augmentation changes at L2. There is also a remote compression fracture of L1. No acute bony findings. Stable advanced facet disease. No obvious pars defects. Stable aortic and iliac artery calcifications. The visualized bony pelvis is intact. Pelvis/left hip: Both hips are normally located. Moderate degenerative changes bilaterally. The pubic symphysis and SI joints are intact. No pelvic fractures. Remote intra medullary rod and proximal dynamic hip screw transfixing a remote intertrochanteric fracture. No acute bony findings. The hardware is intact. IMPRESSION: 1. Remote lumbar compression fractures. No acute abnormality. Stable scoliosis and  degenerative lumbar spondylosis. 2. Intact left hip hardware.  No acute hip or pelvic fracture. Electronically Signed   By: Marijo Sanes M.D.   On: 07/28/2015 16:25   Dg Chest Port 1 View: 07/30/2015  CLINICAL DATA:  Weakness. EXAM: PORTABLE CHEST 1 VIEW COMPARISON:  07/29/2015. FINDINGS: Endotracheal tube repositioned 2.5 cm above the carina. Right IJ line and stable position. Mediastinum and hilar structures are normal. Stable cardiomegaly . Bibasilar atelectasis and/or infiltrates. Small left pleural effusion. No pneumothorax. IMPRESSION: 1. Interim repositioning of endotracheal tube, its tip is now 2.5 cm above the carina in good anatomic position. Right IJ line stable position. 2. Bibasilar atelectasis and/or infiltrates. Small left pleural effusion. No interim change from prior exam . Electronically Signed   By: [place]   On: 07/30/2015 07:57   Portable Chest Xray: 07/29/2015  CLINICAL DATA:  Intubation. EXAM: PORTABLE CHEST 1 VIEW COMPARISON:  09/11/2014 FINDINGS: The endotracheal tube is at the level of the carina, directed toward the right mainstem. There is a right jugular central line with tip in the low SVC. There is no pneumothorax. There is left base consolidation due to atelectasis or infiltrate. Right lung is grossly clear. Pulmonary vasculature is normal. IMPRESSION: 1. Endotracheal tube is at the level of the carina, directed to the right mainstem bronchus origin. Recommend withdrawing the tube 2-3 cm. 2. Right jugular central line.  No pneumothorax. 3. Left base consolidation. Electronically Signed   By: Andreas Newport M.D.   On: 07/29/2015 23:23   Dg Hip Unilat With Pelvis 2-3 Views Left: 07/28/2015  CLINICAL DATA:  Low back and left hip pain.  No time course given. EXAM: LUMBAR SPINE - 2-3 VIEW; DG HIP (WITH OR WITHOUT PELVIS) 2-3V LEFT COMPARISON:  CT scan 09/19/2014 FINDINGS: Lumbar spine: Left convex lumbar scoliosis and degenerative lumbar spondylosis. Stable vertebral  augmentation changes at L2. There is also a remote compression fracture of L1. No acute bony findings. Stable advanced facet disease. No obvious pars defects. Stable aortic and iliac artery calcifications. The visualized bony pelvis is intact. Pelvis/left hip: Both hips are normally located. Moderate degenerative changes bilaterally. The pubic symphysis and SI joints are intact. No pelvic fractures. Remote intra medullary rod and proximal dynamic hip screw transfixing a remote intertrochanteric fracture. No acute bony findings. The hardware is intact. IMPRESSION: 1. Remote lumbar compression fractures. No acute abnormality. Stable scoliosis and degenerative lumbar spondylosis. 2. Intact left  hip hardware.  No acute hip or pelvic fracture. Electronically Signed   By: Marijo Sanes M.D.   On: 07/28/2015 16:25    EKG & Cardiac Imaging    EKG: Sinus rhythm, HR 67 with PAC's and PVC's.  Echocardiogram: 07/31/2015 Study Conclusions - Left ventricle: Systolic function was moderately reduced. The  estimated ejection fraction was in the range of 35% to 40%.  Hypokinesis of the anterior myocardium. Hypokinesis of the apical  myocardium. - Mitral valve: Calcified annulus. Mildly thickened leaflets .  There was mild regurgitation. - Left atrium: The atrium was mildly dilated. - Tricuspid valve: There was moderate regurgitation.  Assessment & Plan    1. Paroxysmal Atrial Fibrillation - atrial fibrillation initially noted in our records in 08/2014. The patient and her family declined further cardiac evaluation at that time and the need for anticoagulation.  - she has maintained NSR this admission. - This patients CHA2DS2-VASc Score and unadjusted Ischemic Stroke Rate (% per year) is equal to 11.2 % stroke rate/year from a score of 7 (CHF, HTN, Female, Age (2), TE (2)). Currently on Heparin. Will need to be converted to Coumadin or NOAC prior to discharge and upon improvement of her anemia.  - would  restart BB once HR and BP improve. Avoid Cardizem with known reduced EF.  2. Chronic Systolic CHF - echo this admission shows of EF of 35-40% with hypokinesis of the anterior and apical myocardium. EF was noted to be decreased in the past as well. - the patient and her family have declined further cardiac workup in the past. Will need to be further discussed once the patient is more arousable and family is present at the bedside. Would not pursue further evaluation in her current state with anemia and the requirement for pressor support.  3. Hypotension - BP has been 90/50 - 139/114 in the past 24 hours. - continue to wean pressor support as BP allows.  4. Ischemic Leg - s/p open embolectomy of left iliofemoral system - per Vascular Surgery  5. Anemia - Hgb 13.4 pre-op but is currently at 8.2 on 08/01/2015. - continue to monitor  Signed, Erma Heritage, PA-C 08/01/2015, 10:12 AM Pager: [phone]

## 2015-08-01 NOTE — Progress Notes (Signed)
ANTICOAGULATION CONSULT NOTE - Follow Up  Pharmacy Consult for Heparin Indication: Afib  Allergies  Allergen Reactions  . Betadine [Povidone Iodine] Hives  . Sulfa Antibiotics   . Valium [Diazepam] Anxiety    Patient Measurements: Height: 5' (152.4 cm) Weight: 208 lb 8.9 oz (94.6 kg) IBW/kg (Calculated) : 45.5  Vital Signs: Temp: 98.7 F (37.1 C) (04/06 0800) Temp Source: Axillary (04/06 0800) BP: 87/56 mmHg (04/06 1100) Pulse Rate: 84 (04/06 1100)  Labs:  Recent Labs  07/30/15 0105 07/30/15 0327 07/30/15 0533  07/31/15 1158  07/31/15 2053 08/01/15 0039 08/01/15 0740  HGB 10.8*  --   --   --  8.3*  --   --   --  8.2*  HCT 32.0*  --   --   --  25.3*  --   --   --  24.8*  PLT 195  --   --   --  222  --   --   --  223  APTT  --   --   --   < > 30  < > 31 34 29  CKTOTAL  --   --  215  --   --   --   --   --   --   TROPONINI  --  2.44*  --   --   --   --   --   --   --   < > = values in this interval not displayed.  Estimated Creatinine Clearance: 34.7 mL/min (by C-G formula based on Cr of 1.24).   Medical History: Past Medical History  Diagnosis Date  . Hypertension   . Thyroid disorder   . Lymphoma ([place])   . Hypothyroidism   . Arthritis   . Diverticulosis   . Obstructive sleep apnea   . Myasthenia gravis ([place])   . Hip fracture, left ([place])   . Fracture of right humerus   . Asthma     Medications:  Scheduled:  . bacitracin  1 application Topical BID  . Chlorhexidine Gluconate Cloth  6 each Topical Q0600  . docusate sodium  100 mg Oral BID  . hydrocortisone sodium succinate  50 mg Intravenous Q6H  . [START ON 08/02/2015] levothyroxine  75 mcg Oral QAC breakfast  . mupirocin ointment  1 application Nasal BID  . piperacillin-tazobactam (ZOSYN)  IV  3.375 g Intravenous 3 times per day  . [START ON 08/02/2015] QUEtiapine  25 mg Oral QHS  . sodium chloride  1,000 mL Intravenous Once  . sodium chloride flush  3 mL Intravenous Q12H  . sodium chloride flush  3  mL Intravenous Q12H   Infusions:  . sodium chloride 10 mL/hr at 07/31/15 0700  . heparin 400 Units/hr (08/01/15 0900)  . phenylephrine (NEO-SYNEPHRINE) Adult infusion 10 mcg/min (08/01/15 1227)    Assessment: 82 y/o F s/p vascular procedure for ischemic leg on heparin gtt for Afib.  Goal of Therapy:  Monitor platelets by anticoagulation protocol: Yes   Plan:  Per MD Schnier, now following normal heparin nomogram for Afib anticoagulation.   Patient restarted on last therapeutic heparin dose at 1450 ml/hr.   Will recheck heparin level at 2230 on 04/06.  Roe Coombs, PharmD Pharmacy Resident 08/01/2015

## 2015-08-01 NOTE — Progress Notes (Signed)
[facility]  Daily Progress Note   Subjective  - 2 Day Post-Op  Patient less verbal today compared to yesterday Objective Filed Vitals:   08/01/15 0606 08/01/15 0655 08/01/15 0700 08/01/15 0800  BP:  110/60 112/52 121/72  Pulse:      Temp:      TempSrc:      Resp:   20 16  Height:      Weight: 94.6 kg (208 lb 8.9 oz)     SpO2:    97%    Intake/Output Summary (Last 24 hours) at 08/01/15 0859 Last data filed at 08/01/15 0600  Gross per 24 hour  Intake 1671.5 ml  Output    600 ml  Net 1071.5 ml    PULM  Normal effort , no use of accessory muscles CV  No JVD, RRR Abd      No distended, nontender VASC  feet are pink with good capillary refill; 1+ DP pulses right groin with skin breakdown superficial uninfected ecchymosis present  Laboratory CBC    Component Value Date/Time   WBC 11.5* 08/01/2015 0740   WBC 9.8 05/17/2014 1834   HGB 8.2* 08/01/2015 0740   HGB 11.6* 05/17/2014 1834   HCT 24.8* 08/01/2015 0740   HCT 36.6 05/17/2014 1834   PLT 223 08/01/2015 0740   PLT 242 05/17/2014 1834    BMET    Component Value Date/Time   NA 136 07/29/2015 0432   NA 137 05/17/2014 1834   K 4.0 07/29/2015 0432   K 4.4 05/17/2014 1834   CL 101 07/29/2015 0432   CL 100 05/17/2014 1834   CO2 31 07/29/2015 0432   CO2 30 05/17/2014 1834   GLUCOSE 149* 07/29/2015 0432   GLUCOSE 99 05/17/2014 1834   BUN 29* 07/29/2015 0432   BUN 15 05/17/2014 1834   CREATININE 1.24* 07/29/2015 0432   CREATININE 1.13 05/17/2014 1834   CALCIUM 8.7* 07/29/2015 0432   CALCIUM 8.7 05/17/2014 1834   GFRNONAA 39* 07/29/2015 0432   GFRNONAA 49* 05/17/2014 1834   GFRNONAA 35* 11/29/2012 1335   GFRNONAA 35* 11/29/2012 1335   GFRAA 45* 07/29/2015 0432   GFRAA 59* 05/17/2014 1834   GFRAA 41* 11/29/2012 1335   GFRAA 40* 11/29/2012 1335    Assessment/Planning: POD #3 s/p angiogram intervention with subsequent open embolectomy of the left femoral system   The plan will be  continue weaning the Neo-Synephrine Foley will be DC'd  Will ask cardiology to evaluate both for initial evaluation of afib and long term treatment as well as her persistent hypotension  CT scan head secondary to the decreased mental status  Katha Cabal  08/01/2015, 8:59 AM

## 2015-08-01 NOTE — Progress Notes (Signed)
ANTICOAGULATION CONSULT NOTE - Follow Up  Pharmacy Consult for Heparin Indication:Ischemic leg  Allergies  Allergen Reactions  . Betadine [Povidone Iodine] Hives  . Sulfa Antibiotics   . Valium [Diazepam] Anxiety    Patient Measurements: Height: 5' (152.4 cm) Weight: 208 lb 8.9 oz (94.6 kg) IBW/kg (Calculated) : 45.5  Vital Signs: Temp: 97.1 F (36.2 C) (04/06 0300) Temp Source: Axillary (04/06 0300) BP: 121/72 mmHg (04/06 0800) Pulse Rate: 101 (04/06 0500)  Labs:  Recent Labs  07/29/15 1308  07/30/15 0105 07/30/15 0327 07/30/15 0533  07/31/15 1158  07/31/15 2053 08/01/15 0039 08/01/15 0740  HGB 14.5  < > 10.8*  --   --   --  8.3*  --   --   --  8.2*  HCT 43.9  < > 32.0*  --   --   --  25.3*  --   --   --  24.8*  PLT 213  < > 195  --   --   --  222  --   --   --  223  APTT 89*  --   --   --   --   < > 30  < > 31 34 29  HEPARINUNFRC 0.58  --   --   --   --   --   --   --   --   --   --   CKTOTAL  --   --   --   --  215  --   --   --   --   --   --   TROPONINI  --   --   --  2.44*  --   --   --   --   --   --   --   < > = values in this interval not displayed.  Estimated Creatinine Clearance: 34.7 mL/min (by C-G formula based on Cr of 1.24).   Medical History: Past Medical History  Diagnosis Date  . Hypertension   . Thyroid disorder   . Lymphoma ([place])   . Hypothyroidism   . Arthritis   . Diverticulosis   . Obstructive sleep apnea   . Myasthenia gravis ([place])   . Hip fracture, left ([place])   . Fracture of right humerus   . Asthma     Medications:  Scheduled:  . Chlorhexidine Gluconate Cloth  6 each Topical Q0600  . hydrocortisone sodium succinate  50 mg Intravenous Q6H  . mupirocin ointment  1 application Nasal BID  . piperacillin-tazobactam (ZOSYN)  IV  3.375 g Intravenous 3 times per day  . QUEtiapine  25 mg Oral TID  . sodium chloride flush  3 mL Intravenous Q12H  . sodium chloride flush  3 mL Intravenous Q12H   Infusions:  . sodium chloride 10  mL/hr at 07/31/15 0700  . heparin 400 Units/hr (07/31/15 0115)  . phenylephrine (NEO-SYNEPHRINE) Adult infusion 30 mcg/min (07/31/15 1900)    Assessment: 82 y/o F s/p vascular procedure for ischemic leg on alteplase and heparin infusion.  Per instructions from Dr. Delana Meyer, drip was to be restarted after surgery at 400 units/hr and tirtrated as follows: if APTT is <50 no change, if >50 hold drip 1 hr then resume at 100units/hr less than before.   Goal of Therapy:  Monitor platelets by anticoagulation protocol: Yes   Plan:  04/06 @ 0740. APTT 29.  APTT remains < 50 so will continue heparin infusion at 400 units/hr. APTT every 4  hours while infusing per vascular.   Roe Coombs, PharmD Pharmacy Resident 08/01/2015

## 2015-08-01 NOTE — Care Management (Signed)
Barrier to discharge- IV Neosynephrine, heparin

## 2015-08-01 NOTE — Progress Notes (Signed)
Initial Nutrition Assessment  DOCUMENTATION CODES:   Morbid obesity  INTERVENTION:  -Monitor intake and results of head CT.   -Recommend adding magic cup BID for added nutrition   NUTRITION DIAGNOSIS:   Inadequate oral intake related to acute illness as evidenced by meal completion < 25%.    GOAL:   Patient will meet greater than or equal to 90% of their needs    MONITOR:   PO intake  REASON FOR ASSESSMENT:   Consult Poor PO  ASSESSMENT:   82 y/o female s/p angiogram open embolectomy of left femoral system due to ischemic lower extremity  Past Medical History  Diagnosis Date  . Hypertension   . Thyroid disorder   . Lymphoma ([place])   . Hypothyroidism   . Arthritis   . Diverticulosis   . Obstructive sleep apnea   . Myasthenia gravis ([place])   . Hip fracture, left ([place])   . Fracture of right humerus   . Asthma     Pt confused this am, unable to answer questions. Spoke with RN, Erline Levine and no breakfast this am. Planning head CT to rule out possible stroke No family at bedside  Medications reviewed Labs reviewed BUN 29, creatinine 1.24, glucose 149, WBC 11.5 (decreasing)  Nutrition-Focused physical exam completed. Findings are no fat depletion, no muscle depletion, and mild edema.     Diet Order:  Diet regular Room service appropriate?: Yes; Fluid consistency:: Thin  Skin:  Reviewed, no issues  Last BM:  07/27/2015  Height:   Ht Readings from Last 1 Encounters:  07/29/15 5' (1.524 m)    Weight: no wt loss per wt encounters  Wt Readings from Last 1 Encounters:  08/01/15 208 lb 8.9 oz (94.6 kg)    Ideal Body Weight:     BMI:  Body mass index is 40.73 kg/(m^2).  Estimated Nutritional Needs:   Kcal:  [id number] kcals/d.   Protein:  94-113 g/d  Fluid:  1.7 L/d  EDUCATION NEEDS:   No education needs identified at this time  Maripaz Mullan B. Zenia Resides, [place], [place] (pager) Weekend/On-Call pager [phone])

## 2015-08-01 NOTE — Progress Notes (Signed)
Notified Dr. Mortimer Fries that patient CVP is 10- he ordered for 1 liter NS bolus-once.  Add met-b once.

## 2015-08-02 LAB — BASIC METABOLIC PANEL
Anion gap: 6 (ref 5–15)
BUN: 33 mg/dL — AB (ref 6–20)
CALCIUM: 8.5 mg/dL — AB (ref 8.9–10.3)
CHLORIDE: 107 mmol/L (ref 101–111)
CO2: 25 mmol/L (ref 22–32)
Creatinine, Ser: 1.6 mg/dL — ABNORMAL HIGH (ref 0.44–1.00)
GFR calc non Af Amer: 28 mL/min — ABNORMAL LOW (ref >60)
GFR, EST AFRICAN AMERICAN: 33 mL/min — AB (ref >60)
GLUCOSE: 185 mg/dL — AB (ref 65–99)
POTASSIUM: 3.7 mmol/L (ref 3.5–5.1)
SODIUM: 138 mmol/L (ref 135–145)

## 2015-08-02 LAB — CBC
HEMATOCRIT: 25.9 % — AB (ref 35.0–47.0)
Hemoglobin: 8.6 g/dL — ABNORMAL LOW (ref 12.0–16.0)
MCH: 30.1 pg (ref 26.0–34.0)
MCHC: 33 g/dL (ref 32.0–36.0)
MCV: 91.2 fL (ref 80.0–100.0)
PLATELETS: 246 10*3/uL (ref 150–440)
RBC: 2.84 MIL/uL — AB (ref 3.80–5.20)
RDW: 14.7 % — ABNORMAL HIGH (ref 11.5–14.5)
WBC: 12.8 10*3/uL — AB (ref 3.6–11.0)

## 2015-08-02 LAB — PHOSPHORUS: PHOSPHORUS: 2.6 mg/dL (ref 2.5–4.6)

## 2015-08-02 LAB — MAGNESIUM: Magnesium: 1.8 mg/dL (ref 1.7–2.4)

## 2015-08-02 LAB — HEPARIN LEVEL (UNFRACTIONATED)
Heparin Unfractionated: 1.39 IU/mL — ABNORMAL HIGH (ref 0.30–0.70)
Heparin Unfractionated: 1.54 IU/mL — ABNORMAL HIGH (ref 0.30–0.70)

## 2015-08-02 LAB — TSH: TSH: 1.203 u[IU]/mL (ref 0.350–4.500)

## 2015-08-02 MED ORDER — AMIODARONE HCL IN DEXTROSE 360-4.14 MG/200ML-% IV SOLN
60.0000 mg/h | INTRAVENOUS | Status: DC
  Administered 2015-08-02 (×2): 60 mg/h via INTRAVENOUS
  Filled 2015-08-02 (×2): qty 200

## 2015-08-02 MED ORDER — APIXABAN 2.5 MG PO TABS
2.5000 mg | ORAL_TABLET | Freq: Two times a day (BID) | ORAL | Status: DC
  Administered 2015-08-02 – 2015-08-05 (×7): 2.5 mg via ORAL
  Filled 2015-08-02 (×7): qty 1

## 2015-08-02 MED ORDER — HEPARIN (PORCINE) IN NACL 100-0.45 UNIT/ML-% IJ SOLN
1200.0000 [IU]/h | INTRAMUSCULAR | Status: DC
  Administered 2015-08-02: 1200 [IU]/h via INTRAVENOUS

## 2015-08-02 MED ORDER — AMIODARONE HCL IN DEXTROSE 360-4.14 MG/200ML-% IV SOLN
30.0000 mg/h | INTRAVENOUS | Status: DC
  Administered 2015-08-03 (×2): 30 mg/h via INTRAVENOUS
  Filled 2015-08-02 (×7): qty 200

## 2015-08-02 MED ORDER — AMIODARONE LOAD VIA INFUSION
150.0000 mg | Freq: Once | INTRAVENOUS | Status: AC
  Administered 2015-08-02: 150 mg via INTRAVENOUS
  Filled 2015-08-02: qty 83.34

## 2015-08-02 MED ORDER — ENSURE ENLIVE PO LIQD
237.0000 mL | Freq: Two times a day (BID) | ORAL | Status: DC
  Administered 2015-08-02 – 2015-08-04 (×5): 237 mL via ORAL

## 2015-08-02 MED ORDER — DULOXETINE HCL 30 MG PO CPEP
60.0000 mg | ORAL_CAPSULE | Freq: Every day | ORAL | Status: DC
  Administered 2015-08-02 – 2015-08-07 (×6): 60 mg via ORAL
  Filled 2015-08-02 (×6): qty 2

## 2015-08-02 NOTE — Clinical Social Work Note (Signed)
[facility] called CSW to inform that they would need a decision from family by 2:30 today because they had other bed offers that they would need to make. CSW contacted patient's daughter: Ms. Randel Pigg and informed her of this. Ms. Randel Pigg stated to go ahead and have [facility] release the offer because they were leaning towards [facility]. CSW has accepted the bed at [facility] in the interim. Shela Leff MSW,LCSW [phone]

## 2015-08-02 NOTE — Clinical Social Work Note (Signed)
CSW has had a conversation with patient's daughter: Ms. Randel Pigg this morning to extend rehab bed offers. Patient's daughter is going to speak with her mother late this afternoon regarding them and states it will probably be either [facility] or [facility] that she will choose. She will contact CSW once she has decided. Patient will need auth from her insurance as well. Shela Leff MSW,LCSW [phone]

## 2015-08-02 NOTE — Consult Note (Signed)
[facility] Consultation      Name: [name] MRN: [id number] DOB: 07-12-1929    ADMISSION DATE:  07/28/2015  CHIEF COMPLAINT:   Acute resp failure-resolved   HISTORY OF PRESENT ILLNESS  Extubated, off vasopressors,  Patient alert and awake, follows commands  intermittant confusion, lethargy CVP 10 patient given 2 L NS yesterday   Review of Systems  Constitutional: Positive for malaise/fatigue. Negative for fever and chills.  HENT: Negative for congestion.   Eyes: Negative for blurred vision.  Respiratory: Negative for cough, hemoptysis, sputum production, shortness of breath and wheezing.   Cardiovascular: Negative for chest pain.  Gastrointestinal: Negative for heartburn.  Genitourinary: Negative for dysuria.  Musculoskeletal: Negative for myalgias.  Neurological: Negative for dizziness and headaches.  Psychiatric/Behavioral: The patient is nervous/anxious.       VITAL SIGNS    Temp:  [97.5 F (36.4 C)-98.6 F (37 C)] 97.5 F (36.4 C) (04/07 0800) Pulse Rate:  [84-118] 115 (04/07 0825) Resp:  [15-30] 21 (04/07 0825) BP: (87-125)/(38-92) 118/73 mmHg (04/07 0825) SpO2:  [92 %-100 %] 98 % (04/07 0825) HEMODYNAMICS: CVP:  [6 mmHg-10 mmHg] 8 mmHg VENTILATOR SETTINGS:   INTAKE / OUTPUT:  Intake/Output Summary (Last 24 hours) at 08/02/15 0934 Last data filed at 08/02/15 0600  Gross per 24 hour  Intake 2389.13 ml  Output   1100 ml  Net 1289.13 ml       PHYSICAL EXAM   Physical Exam  Constitutional: She is oriented to person, place, and time. No distress.  HENT:  Head: Normocephalic and atraumatic.  Eyes: Pupils are equal, round, and reactive to light. No scleral icterus.  Neck: Normal range of motion.  Cardiovascular: Normal rate and regular rhythm.   No murmur heard. Pulmonary/Chest: No stridor. No respiratory distress. She has no wheezes. She has no rales.  Abdominal: Soft. She exhibits no distension. There is no  tenderness.  Musculoskeletal: She exhibits no edema.  Wound vac in left leg, compressive device RT leg  Neurological: She is alert and oriented to person, place, and time. She displays normal reflexes. Coordination normal.  Skin: Skin is warm. No rash noted. She is not diaphoretic.       LABS   LABS:  CBC  Recent Labs Lab 07/31/15 1158 08/01/15 0740 08/02/15 0500  WBC 13.3* 11.5* 12.8*  HGB 8.3* 8.2* 8.6*  HCT 25.3* 24.8* 25.9*  PLT 222 223 246   Coag's  Recent Labs Lab 07/28/15 1514  07/31/15 2053 08/01/15 0039 08/01/15 0740  APTT 37*  < > 31 34 29  INR 1.16  --   --   --   --   < > = values in this interval not displayed. BMET  Recent Labs Lab 07/28/15 1316 07/29/15 0432 08/01/15 2247  NA 132* 136 138  K 4.0 4.0 2.8*  CL 98* 101 108  CO2 27 31 30   BUN 28* 29* 32*  CREATININE 1.40* 1.24* 1.58*  GLUCOSE 97 149* 157*   Electrolytes  Recent Labs Lab 07/28/15 1316 07/29/15 0432 08/01/15 2247  CALCIUM 8.9 8.7* 8.0*   Sepsis Markers  Recent Labs Lab 07/30/15 0327  LATICACIDVEN 1.7   ABG  Recent Labs Lab 07/29/15 2315 07/30/15 0503  PHART 7.53* 7.34*  PCO2ART 33 52*  PO2ART 102 107   Liver Enzymes No results for input(s): AST, ALT, ALKPHOS, BILITOT, ALBUMIN in the last 168 hours. Cardiac Enzymes  Recent Labs Lab 07/30/15 0327 08/01/15 1611  TROPONINI 2.44* 0.26*  Glucose No results for input(s): GLUCAP in the last 168 hours.   Recent Results (from the past 240 hour(s))  MRSA PCR Screening     Status: Abnormal   Collection Time: 07/28/15 10:00 PM  Result Value Ref Range Status   MRSA by PCR POSITIVE (A) NEGATIVE Final    Comment:        The GeneXpert MRSA Assay (FDA approved for NASAL specimens only), is one component of a comprehensive MRSA colonization surveillance program. It is not intended to diagnose MRSA infection nor to guide or monitor treatment for MRSA infections. CRITICAL RESULT CALLED TO, READ BACK BY  AND VERIFIED WITH: CALLED TO LISA ROMERO @ [id number] ON 07/28/2015 BY CAF      Current facility-administered medications:  .  0.9 %  sodium chloride infusion, , Intravenous, Continuous, Algernon Huxley, MD, Last Rate: 10 mL/hr at 07/31/15 0700 .  acetaminophen (TYLENOL) tablet 650 mg, 650 mg, Oral, Q6H PRN **OR** acetaminophen (TYLENOL) suppository 650 mg, 650 mg, Rectal, Q6H PRN, Katha Cabal, MD .  albuterol (PROVENTIL) (2.5 MG/3ML) 0.083% nebulizer solution 3 mL, 3 mL, Inhalation, Q6H PRN, Katha Cabal, MD .  bacitracin ointment 1 application, 1 application, Topical, BID, Katha Cabal, MD, 1 application at [id number] .  Chlorhexidine Gluconate Cloth 2 % PADS 6 each, 6 each, Topical, Q0600, Flora Lipps, MD, 6 each at 08/01/15 0540 .  docusate sodium (COLACE) capsule 100 mg, 100 mg, Oral, BID, Flora Lipps, MD, 100 mg at 08/01/15 2114 .  fentaNYL (SUBLIMAZE) bolus via infusion 25 mcg, 25 mcg, Intravenous, Q1H PRN, Kara Mead V, MD .  heparin ADULT infusion 100 units/mL (25000 units/250 mL), 1,200 Units/hr, Intravenous, Continuous, Katha Cabal, MD, Last Rate: 12 mL/hr at 08/02/15 0600, 1,200 Units/hr at 08/02/15 0600 .  HYDROcodone-acetaminophen (NORCO/VICODIN) 5-325 MG per tablet 1-2 tablet, 1-2 tablet, Oral, Q4H PRN, Katha Cabal, MD .  hydrocortisone sodium succinate (SOLU-CORTEF) 100 MG injection 50 mg, 50 mg, Intravenous, Q6H, Raylene Miyamoto, MD, 50 mg at 08/02/15 0825 .  levothyroxine (SYNTHROID, LEVOTHROID) tablet 75 mcg, 75 mcg, Oral, QAC breakfast, Flora Lipps, MD, 75 mcg at 08/02/15 0824 .  magnesium hydroxide (MILK OF MAGNESIA) suspension 30 mL, 30 mL, Oral, Daily PRN, Katha Cabal, MD .  morphine 2 MG/ML injection 2 mg, 2 mg, Intravenous, Q1H PRN, Katha Cabal, MD, 2 mg at 07/31/15 0131 .  mupirocin ointment (BACTROBAN) 2 % 1 application, 1 application, Nasal, BID, Flora Lipps, MD, 1 application at [id number] .  [DISCONTINUED] ondansetron (ZOFRAN)  tablet 4 mg, 4 mg, Oral, Q6H PRN **OR** ondansetron (ZOFRAN) injection 4 mg, 4 mg, Intravenous, Q6H PRN, Katha Cabal, MD .  phenylephrine (NEO-SYNEPHRINE) 10 mg in dextrose 5 % 250 mL (0.04 mg/mL) infusion, 30-200 mcg/min, Intravenous, Continuous, Raylene Miyamoto, MD, Last Rate: 4.5 mL/hr at 08/01/15 1945, 3 mcg/min at 08/01/15 1945 .  piperacillin-tazobactam (ZOSYN) IVPB 3.375 g, 3.375 g, Intravenous, 3 times per day, Raylene Miyamoto, MD, 3.375 g at 08/01/15 2114 .  polyethylene glycol (MIRALAX / GLYCOLAX) packet 17 g, 17 g, Oral, Daily PRN, Flora Lipps, MD .  QUEtiapine (SEROQUEL) tablet 25 mg, 25 mg, Oral, QHS, Flora Lipps, MD .  sodium chloride flush (NS) 0.9 % injection 10-40 mL, 10-40 mL, Intracatheter, PRN, Katha Cabal, MD .  sodium chloride flush (NS) 0.9 % injection 3 mL, 3 mL, Intravenous, Q12H, Katha Cabal, MD, 3 mL at 08/01/15 2115 .  sodium chloride  flush (NS) 0.9 % injection 3 mL, 3 mL, Intravenous, Q12H, Katha Cabal, MD, 3 mL at 08/01/15 2115 .  sodium chloride flush (NS) 0.9 % injection 3 mL, 3 mL, Intravenous, PRN, Katha Cabal, MD .  sorbitol 70 % solution 30 mL, 30 mL, Oral, Daily PRN, Katha Cabal, MD, 30 mL at 07/31/15 1639  IMAGING    Ct Head Wo Contrast  08/01/2015  CLINICAL DATA:  Recent mental status changes.  Confusion. EXAM: CT HEAD WITHOUT CONTRAST TECHNIQUE: Contiguous axial images were obtained from the base of the skull through the vertex without intravenous contrast. COMPARISON:  09/12/2014.  09/11/2014. FINDINGS: There is generalized brain atrophy. There chronic small-vessel ischemic changes affecting the deep white matter. No sign of acute infarction. No mass lesion, hemorrhage or extra-axial collection. Chronic ventriculomegaly is felt secondary to central atrophy. This appears unchanged. No calvarial abnormality. Sinuses, middle ears and mastoids are clear. IMPRESSION: No acute or reversible finding. Atrophy and chronic  small-vessel ischemic changes of the deep white matter. Chronic ventriculomegaly felt secondary to central atrophy. Electronically Signed   By: Nelson Chimes M.D.   On: 08/01/2015 11:36      INDWELLING DEVICES:: RT IJ CVL 4/3>>>>  MICRO DATA: MRSA PCR POS   ANTIMICROBIALS: zosyn>>>    ASSESSMENT/PLAN  82 yo white female admitted for post op resp failure with hypotension-sedation vs hypovolumia Successful extubation  PULMONARY -Respiratory Failure-resolved  CARDIOVASCULAR Shock-volume depleted,sedative related-patient with inaccurate cuff pressure -wean off vasopressors based on manual blood pressure -CVP 10 saline boluses as needed -target MAP>60  RENAL Follow chem 7 and UO,  -foley catheter removed  GASTROINTESTINAL Diet as tolerated  HEMATOLOGIC Follow CBC Transfusion guidelines  INFECTIOUS Empiric abx   ENDOCRINE Follow FSBS     I have personally obtained a history, examined the patient, evaluated laboratory and independently reviewed  imaging results, formulated the assessment and plan and placed orders.    Corrin Parker, M.D.  [facility]  Medical Director [place] Director [facility] Cardio-Pulmonary Department

## 2015-08-02 NOTE — Progress Notes (Signed)
Physical Therapy Treatment Patient Details Name: [name] MRN: [id number] DOB: [birth year] Today's Date: 08/02/2015    History of Present Illness Pt is a pleasant 82 y.o. F admitted to hospital for ischemia in L leg. Pt received angiogram in B LE and thromboembolectomy in L LE on 4/3. Pt intubated on 4/3 and extubated on 4/4. Pt has hx of HTN, thyroid disorder, myasthenia gravis, L hip fx, R humerus fx, A-fib, and dementia.      PT Comments    Pt demonstrated limited progress towards goals due to fatigue and pain associated with medical complications. PT treatment limited to bed today due to pts increased HR. Pt expressed today was not a good day to do PT, however was able to persuade her to participate in exercises. Pt able to perform supine there-ex on B LE and UE with min to no assist. Pt required heavy cues for proper form during exercises. Pt demonstrates deficits in strength, ROM and mobility. Pt would benefit from further skilled PT to address deficits and return to PLOF; recommend pt sent to SNF following discharge from acute hospitalization.   Follow Up Recommendations  SNF     Equipment Recommendations       Recommendations for Other Services       Precautions / Restrictions Precautions Precautions: Fall Restrictions Weight Bearing Restrictions: No    Mobility  Bed Mobility               General bed mobility comments: Bed mobility not performed for safety reasons due to pts elevated HR.   Transfers                    Ambulation/Gait                 Stairs            Wheelchair Mobility    Modified Rankin (Stroke Patients Only)       Balance                                    Cognition Arousal/Alertness: Awake/alert Behavior During Therapy: WFL for tasks assessed/performed Overall Cognitive Status: History of cognitive impairments - at baseline       Memory: Decreased short-term memory              Exercises Other Exercises Other Exercises: Pt performed supine ther-ex on B LE including ankle pumps, SLR, and heel slides w/min assist. Pt performed B UE elbow flex/ext. Pt required heavy cueing regarding proper speed and form w/exercises. All ther-ex performed x10 reps.      General Comments        Pertinent Vitals/Pain Pain Assessment: Faces Faces Pain Scale: Hurts even more Pain Location: B LE, chest Pain Intervention(s): Limited activity within patient's tolerance    Home Living                      Prior Function            PT Goals (current goals can now be found in the care plan section) Acute Rehab PT Goals Patient Stated Goal: to return to PLOF PT Goal Formulation: Patient unable to participate in goal setting Time For Goal Achievement: 08/14/15 Potential to Achieve Goals: Fair Progress towards PT goals: Progressing toward goals    Frequency  Min 2X/week    PT Plan Current plan remains appropriate  Co-evaluation             End of Session   Activity Tolerance: Patient limited by fatigue;Patient limited by pain Patient left: in bed;with bed alarm set;with call bell/phone within reach;with family/visitor present     Time: [id number] PT Time Calculation (min) (ACUTE ONLY): 10 min  Charges:  $Therapeutic Exercise: 8-22 mins                    G Codes:      Sherral Hammers 08-29-2015, 5:01 PM  M. Barnett Abu, SPT

## 2015-08-02 NOTE — Progress Notes (Signed)
Update:  Patient off pressors for the past 12 hours, off [place] and saturating well. Stable for transfer out of the ICU if doing well the rest of the day.  Spoke with Dr. Delana Meyer, okay with transfer to medsurg in the AM. Spoke with [facility] (Dr. Earleen Newport), accepted transfer to IM from 4/8 AM.  Vilinda Boehringer, MD [facility] Pager [phone] (please enter 7-digits) On Call Pager - [phone] (please enter 7-digits)

## 2015-08-02 NOTE — Plan of Care (Signed)
Problem: Spiritual Needs Goal: Ability to function at adequate level Outcome: Progressing Pt is able to communicate needs to staff. Pt is more alert today and more oriented. Staff able to better provide a restful environment.

## 2015-08-02 NOTE — Consult Note (Signed)
  Dr Stevenson Clinch Critical care specialist consulted medicine to help with care (3:30pm 08/02/2015).  Since critcal care specialist saw the patient today and wrote and note and billed.  Medicine will follow the patient tomorrow.  Dr Leslye Peer

## 2015-08-02 NOTE — Progress Notes (Signed)
ANTICOAGULATION CONSULT NOTE - Follow Up  Pharmacy Consult for Heparin Indication: Afib  Allergies  Allergen Reactions  . Betadine [Povidone Iodine] Hives  . Sulfa Antibiotics   . Valium [Diazepam] Anxiety    Patient Measurements: Height: 5' (152.4 cm) Weight: 208 lb 8.9 oz (94.6 kg) IBW/kg (Calculated) : 45.5  Vital Signs: Temp: 97.5 F (36.4 C) (04/07 0800) Temp Source: Oral (04/07 0800) BP: 107/78 mmHg (04/07 1000) Pulse Rate: 133 (04/07 1000)  Labs:  Recent Labs  07/31/15 1158  07/31/15 2053 08/01/15 0039 08/01/15 0740 08/01/15 1611 08/01/15 2247 08/02/15 0500 08/02/15 1055 08/02/15 1319  HGB 8.3*  --   --   --  8.2*  --   --  8.6*  --   --   HCT 25.3*  --   --   --  24.8*  --   --  25.9*  --   --   PLT 222  --   --   --  223  --   --  246  --   --   APTT 30  < > 31 34 29  --   --   --   --   --   HEPARINUNFRC  --   --   --   --   --   --  1.39*  --   --  1.54*  CREATININE  --   --   --   --   --   --  1.58*  --  1.60*  --   TROPONINI  --   --   --   --   --  0.26*  --   --   --   --   < > = values in this interval not displayed.  Estimated Creatinine Clearance: 26.9 mL/min (by C-G formula based on Cr of 1.6).   Medical History: Past Medical History  Diagnosis Date  . Hypertension   . Thyroid disorder   . Lymphoma ([place])   . Hypothyroidism   . Arthritis   . Diverticulosis   . Obstructive sleep apnea   . Myasthenia gravis ([place])   . Hip fracture, left ([place])   . Fracture of right humerus   . Asthma   . PAF (paroxysmal atrial fibrillation) ([place])     a. diagnosed in 2016. Refused anticoagulation  . Chronic systolic CHF (congestive heart failure) ([place])     a. 07/2015: EF 35-40% with hypokinesis of the anterior and apical myocardium  . Ischemic leg     a. 07/2015: s/p thrombolysis and open embolectomy of left iliofemoral system    Medications:  Scheduled:  . bacitracin  1 application Topical BID  . Chlorhexidine Gluconate Cloth  6 each Topical  Q0600  . docusate sodium  100 mg Oral BID  . DULoxetine  60 mg Oral Daily  . feeding supplement (ENSURE ENLIVE)  237 mL Oral BID BM  . hydrocortisone sodium succinate  50 mg Intravenous Q6H  . levothyroxine  75 mcg Oral QAC breakfast  . mupirocin ointment  1 application Nasal BID  . piperacillin-tazobactam (ZOSYN)  IV  3.375 g Intravenous 3 times per day  . QUEtiapine  25 mg Oral QHS  . sodium chloride flush  3 mL Intravenous Q12H  . sodium chloride flush  3 mL Intravenous Q12H   Infusions:  . sodium chloride 10 mL/hr at 07/31/15 0700  . heparin 1,200 Units/hr (08/02/15 0600)  . phenylephrine (NEO-SYNEPHRINE) Adult infusion Stopped (08/02/15 1031)    Assessment: 82  y/o F s/p vascular procedure for ischemic leg on heparin gtt for Afib.   Now, per MD Delana Meyer, MD [place], and Cards PA [place], will initiate Eliquis for long term anticoagulation for this patient's Afib.   Goal of Therapy:  Monitor platelets by anticoagulation protocol: Yes   Plan:  Will stop heparin gtt and initiate Eliquis at same time.   Will give Eliquis 2.5mg  PO BID due to patient's age and Scr >1.5.   Pharmacy will continue to monitor.   Roe Coombs, PharmD Pharmacy Resident 08/02/2015

## 2015-08-02 NOTE — Progress Notes (Signed)
Patient resting comfortably overnight, foley cath removed per MD order patient short run of SVT with exertion. BP remains stable, neo titrated off see CHL for further assessment will continue to assess for changes/need.

## 2015-08-02 NOTE — Progress Notes (Signed)
Hospital Problem List     Active Problems:   Ischemic leg   Paroxysmal atrial fibrillation (HCC)   NSTEMI (non-ST elevated myocardial infarction) [facility])    Patient Profile:   Primary Cardiologist: New to [facility] - Dr. Fletcher Anon  82 y.o. female with past medical history of PAF (not on anticoagulation), HTN, Hypothyroidism, Myasthenia Gravis, OSA, and Diverticulosis who presented to [facility] on 07/28/2015 for evaluation of left leg pain. Found to have embolic occlusion of left iliofemoral system, now s/p thrombolysis and open embolectomy on 07/29/2015.  Subjective   More alert today. Denies any pain in her lower extremities. Denies any chest pain, palpitations, or shortness of breath.  Inpatient Medications    . bacitracin  1 application Topical BID  . Chlorhexidine Gluconate Cloth  6 each Topical Q0600  . docusate sodium  100 mg Oral BID  . feeding supplement (ENSURE ENLIVE)  237 mL Oral BID BM  . hydrocortisone sodium succinate  50 mg Intravenous Q6H  . levothyroxine  75 mcg Oral QAC breakfast  . mupirocin ointment  1 application Nasal BID  . piperacillin-tazobactam (ZOSYN)  IV  3.375 g Intravenous 3 times per day  . QUEtiapine  25 mg Oral QHS  . sodium chloride flush  3 mL Intravenous Q12H  . sodium chloride flush  3 mL Intravenous Q12H    Vital Signs    Filed Vitals:   08/02/15 0800 08/02/15 0825 08/02/15 0900 08/02/15 1000  BP: 95/38 118/73 118/63 107/78  Pulse: 118 115 128 133  Temp: 97.5 F (36.4 C)     TempSrc: Oral     Resp: 25 21 24 21   Height:      Weight:      SpO2: 93% 98% 95% 94%    Intake/Output Summary (Last 24 hours) at 08/02/15 1137 Last data filed at 08/02/15 1000  Gross per 24 hour  Intake 2428.13 ml  Output   1100 ml  Net 1328.13 ml   Filed Weights   07/28/15 1316 07/28/15 2002 08/01/15 0606  Weight: 200 lb (90.719 kg) 201 lb (91.173 kg) 208 lb 8.9 oz (94.6 kg)    Physical Exam    General: Elderly Caucasian female appearing in no acute  distress. Head: Normocephalic, atraumatic.  Neck: Supple without bruits, JVD not elevated. Lungs:  Resp regular and unlabored, mild expiratory wheeze noted in upper lung fields. Heart: Regular rhythm, tachycardiac rate, S1, S2, no S3, S4, or murmur; no rub. Abdomen: Soft, non-tender, non-distended with normoactive bowel sounds. No hepatomegaly. No rebound/guarding. No obvious abdominal masses. Extremities: No clubbing, cyanosis, or edema. Distal pedal pulses are 1+ bilaterally. Drains in place. Neuro: Alert and oriented X 3. Moves all extremities spontaneously. Psych: Normal affect.  Labs    CBC  Recent Labs  08/01/15 0740 08/02/15 0500  WBC 11.5* 12.8*  HGB 8.2* 8.6*  HCT 24.8* 25.9*  MCV 91.8 91.2  PLT 223 0000000   Basic Metabolic Panel  Recent Labs  08/01/15 2247  NA 138  K 2.8*  CL 108  CO2 30  GLUCOSE 157*  BUN 32*  CREATININE 1.58*  CALCIUM 8.0*   Liver Function Tests No results for input(s): AST, ALT, ALKPHOS, BILITOT, PROT, ALBUMIN in the last 72 hours. No results for input(s): LIPASE, AMYLASE in the last 72 hours. Cardiac Enzymes  Recent Labs  08/01/15 1611  TROPONINI 0.26*   Thyroid Function Tests  Recent Labs  08/02/15 0500  TSH 1.203    Telemetry    Sinus tachycardia,  HR in 110's, peaking into 140's.   Cardiac Studies and Radiology   Ct Head Wo Contrast: 08/01/2015  CLINICAL DATA:  Recent mental status changes.  Confusion. EXAM: CT HEAD WITHOUT CONTRAST TECHNIQUE: Contiguous axial images were obtained from the base of the skull through the vertex without intravenous contrast. COMPARISON:  09/12/2014.  09/11/2014. FINDINGS: There is generalized brain atrophy. There chronic small-vessel ischemic changes affecting the deep white matter. No sign of acute infarction. No mass lesion, hemorrhage or extra-axial collection. Chronic ventriculomegaly is felt secondary to central atrophy. This appears unchanged. No calvarial abnormality. Sinuses, middle ears  and mastoids are clear. IMPRESSION: No acute or reversible finding. Atrophy and chronic small-vessel ischemic changes of the deep white matter. Chronic ventriculomegaly felt secondary to central atrophy. Electronically Signed   By: Nelson Chimes M.D.   On: 08/01/2015 11:36   Dg Chest Port 1 View: 07/30/2015  CLINICAL DATA:  Weakness. EXAM: PORTABLE CHEST 1 VIEW COMPARISON:  07/29/2015. FINDINGS: Endotracheal tube repositioned 2.5 cm above the carina. Right IJ line and stable position. Mediastinum and hilar structures are normal. Stable cardiomegaly . Bibasilar atelectasis and/or infiltrates. Small left pleural effusion. No pneumothorax. IMPRESSION: 1. Interim repositioning of endotracheal tube, its tip is now 2.5 cm above the carina in good anatomic position. Right IJ line stable position. 2. Bibasilar atelectasis and/or infiltrates. Small left pleural effusion. No interim change from prior exam . Electronically Signed   By: Marcello Moores  Register   On: 07/30/2015 07:57   Echocardiogram: 07/31/2015 Study Conclusions - Left ventricle: Systolic function was moderately reduced. The  estimated ejection fraction was in the range of 35% to 40%.  Hypokinesis of the anterior myocardium. Hypokinesis of the apical  myocardium. - Mitral valve: Calcified annulus. Mildly thickened leaflets .  There was mild regurgitation. - Left atrium: The atrium was mildly dilated. - Tricuspid valve: There was moderate regurgitation.  Assessment & Plan    1. Paroxysmal Atrial Fibrillation - atrial fibrillation initially noted in our records in 08/2014. The patient and her family declined further cardiac evaluation at that time and the need for anticoagulation.  - This patients CHA2DS2-VASc Score and unadjusted Ischemic Stroke Rate (% per year) is equal to 11.2 % stroke rate/year from a score of 7 (CHF, HTN, Female, Age (2), TE (2)). Currently on Heparin. Talked with the patient about long-term anticoagulation this AM and she  understands the reasoning for starting this. Consider initiation of Eliquis 5mg  BID prior to discharge. - she has maintained NSR this admission but is now tachycardic into the 110's -140's. Appears to be in Sinus rhythm on telemetry but will obtain EKG to confirm. Just weaned from pressors this AM. Would restart BB once BP is stabilized. Avoid Cardizem with reduced EF.  2. Chronic Systolic CHF - echo this admission shows of EF of 35-40% with hypokinesis of the anterior and apical myocardium. EF was noted to be decreased in the past as well. - the patient and her family have declined further cardiac workup in the past. She is unsure of further workup at this time. Is in agreement for long-term anticoagulation. Will discuss further cardiac workup when family is present and she is more alert.  3. Hypotension - BP has been 94/38 - 125/92 in the past 24 hours. - has been weaned off pressor support this AM  4. Ischemic Leg - s/p open embolectomy of left iliofemoral system - per Vascular Surgery  5. Anemia - Hgb 13.4 pre-op but reached a trough of 8.2 on  08/01/2015. Trending up to 8.6 today. - continue to monitor  6. Elevated Troponin - elevated to 2.44 on admission. Trending down to 0.26 on 08/01/2015. - denies any recent anginal symptoms. Likely secondary to demand ischemic in the setting of ischemic leg and known CHF. - the patient and her family have declined further cardiac workup in the past.  Signed, Erma Heritage , PA-C 11:37 AM 08/02/2015 Pager: [phone]

## 2015-08-02 NOTE — Progress Notes (Addendum)
PT Cancellation Note  Patient Details Name: [name] MRN: [id number] DOB: 12-24-1929   Cancelled Treatment:    Reason Eval/Treat Not Completed: Other (comment). Pt with elevated HR at rest ranging from 114->133bpm while resting. Pt not appropriate for therapy intervention at this time. Will hold treatment. Will re-assess in PM.   Cliffie Gingras 08/02/2015, 10:39 AM  Greggory Stallion, PT, DPT [phone]

## 2015-08-02 NOTE — Progress Notes (Signed)
ANTICOAGULATION CONSULT NOTE - Follow Up  Pharmacy Consult for Heparin Indication: Afib  Allergies  Allergen Reactions  . Betadine [Povidone Iodine] Hives  . Sulfa Antibiotics   . Valium [Diazepam] Anxiety    Patient Measurements: Height: 5' (152.4 cm) Weight: 208 lb 8.9 oz (94.6 kg) IBW/kg (Calculated) : 45.5  Vital Signs: Temp: 98.1 F (36.7 C) (04/06 1900) Temp Source: Oral (04/06 1900) BP: 99/55 mmHg (04/06 2300) Pulse Rate: 100 (04/06 2300)  Labs:  Recent Labs  07/30/15 0327 07/30/15 0533  07/31/15 1158  07/31/15 2053 08/01/15 0039 08/01/15 0740 08/01/15 1611 08/01/15 2247  HGB  --   --   --  8.3*  --   --   --  8.2*  --   --   HCT  --   --   --  25.3*  --   --   --  24.8*  --   --   PLT  --   --   --  222  --   --   --  223  --   --   APTT  --   --   < > 30  < > 31 34 29  --   --   HEPARINUNFRC  --   --   --   --   --   --   --   --   --  1.39*  CREATININE  --   --   --   --   --   --   --   --   --  1.58*  CKTOTAL  --  215  --   --   --   --   --   --   --   --   TROPONINI 2.44*  --   --   --   --   --   --   --  0.26*  --   < > = values in this interval not displayed.  Estimated Creatinine Clearance: 27.2 mL/min (by C-G formula based on Cr of 1.58).   Medical History: Past Medical History  Diagnosis Date  . Hypertension   . Thyroid disorder   . Lymphoma ([place])   . Hypothyroidism   . Arthritis   . Diverticulosis   . Obstructive sleep apnea   . Myasthenia gravis ([place])   . Hip fracture, left ([place])   . Fracture of right humerus   . Asthma   . PAF (paroxysmal atrial fibrillation) ([place])     a. diagnosed in 2016. Refused anticoagulation  . Chronic systolic CHF (congestive heart failure) ([place])     a. 07/2015: EF 35-40% with hypokinesis of the anterior and apical myocardium  . Ischemic leg     a. 07/2015: s/p thrombolysis and open embolectomy of left iliofemoral system    Medications:  Scheduled:  . bacitracin  1 application Topical BID  .  Chlorhexidine Gluconate Cloth  6 each Topical Q0600  . docusate sodium  100 mg Oral BID  . hydrocortisone sodium succinate  50 mg Intravenous Q6H  . levothyroxine  75 mcg Oral QAC breakfast  . mupirocin ointment  1 application Nasal BID  . piperacillin-tazobactam (ZOSYN)  IV  3.375 g Intravenous 3 times per day  . potassium chloride  40 mEq Oral Q4H  . QUEtiapine  25 mg Oral QHS  . sodium chloride flush  3 mL Intravenous Q12H  . sodium chloride flush  3 mL Intravenous Q12H   Infusions:  . sodium chloride  10 mL/hr at 07/31/15 0700  . heparin    . phenylephrine (NEO-SYNEPHRINE) Adult infusion 3 mcg/min (08/01/15 1945)    Assessment: 82 y/o F s/p vascular procedure for ischemic leg on heparin gtt for Afib.  Goal of Therapy:  Monitor platelets by anticoagulation protocol: Yes   Plan:  Per MD Schnier, now following normal heparin nomogram for Afib anticoagulation.   Patient restarted on last therapeutic heparin dose at 1450 ml/hr.   Will recheck heparin level at 2230 on 04/06.  4/6 PM heparin level 1.39. Hold x 1 hours and restart at 1200 units/hr. Recheck in 8 hours.  Roe Coombs, PharmD Pharmacy Resident 08/02/2015

## 2015-08-02 NOTE — Progress Notes (Signed)
Pt has rested comfortably on my shift with no complaints of pain. Pt had an increase in  tachycardia upon exertion, pt HR  Returned to baseline in the 120's with no interventions from nursing, MD aware. Pt has not been able to use bedpan today due to incontinence.  Pt is alert and oriented X4. Heparin and Neo synephrine dc'd today per MD order.

## 2015-08-02 NOTE — Progress Notes (Signed)
[facility]  Daily Progress Note   Subjective: 07/29/15: Introduction catheter into aorta right common femoral artery approach, Introduction catheter into aorta left common femoral artery approach and Initiation of arterial thrombolysis with take back for second look angiography with aortogram and bilateral iliofemoral angiograms, catheter placement into aorta from bilateral femoral approaches, mechanical rheolytic thrombectomy of the aorta, left iliac artery, and left common femoral artery and bilateral iliac artery stent placements to reconstruct the distal aorta and proximal iliac arteries with take back later that evening for thromboembolectomy left external iliac artery, common femoral artery, profunda femoris artery and superficial femoral artery.  Patient without complaint. Slightly confused. Off Neo.  Objective: Filed Vitals:   08/02/15 0800 08/02/15 0825 08/02/15 0900 08/02/15 1000  BP: 95/38 118/73 118/63 107/78  Pulse: 118 115 128 133  Temp: 97.5 F (36.4 C)     TempSrc: Oral     Resp: 25 21 24 21   Height:      Weight:      SpO2: 93% 98% 95% 94%    Intake/Output Summary (Last 24 hours) at 08/02/15 1332 Last data filed at 08/02/15 1000  Gross per 24 hour  Intake 2428.13 ml  Output   1100 ml  Net 1328.13 ml   Physical Exam: Slightly confused, NAD Neck: Right IJ intact - no signs of infection CV: RRR Pulmonary: CTA Bilaterally Abdomen: Soft, Nontender, Nondistended Vascular:  Right Groin: healing well, skin tear located on lower abdomen.  Left Groin: VAC change today. Wound healing well. No signs of infection noted.  Laboratory: CBC    Component Value Date/Time   WBC 12.8* 08/02/2015 0500   WBC 9.8 05/17/2014 1834   HGB 8.6* 08/02/2015 0500   HGB 11.6* 05/17/2014 1834   HCT 25.9* 08/02/2015 0500   HCT 36.6 05/17/2014 1834   PLT 246 08/02/2015 0500   PLT 242 05/17/2014 1834   BMET    Component Value Date/Time   NA 138 08/02/2015 1055    NA 137 05/17/2014 1834   K 3.7 08/02/2015 1055   K 4.4 05/17/2014 1834   CL 107 08/02/2015 1055   CL 100 05/17/2014 1834   CO2 25 08/02/2015 1055   CO2 30 05/17/2014 1834   GLUCOSE 185* 08/02/2015 1055   GLUCOSE 99 05/17/2014 1834   BUN 33* 08/02/2015 1055   BUN 15 05/17/2014 1834   CREATININE 1.60* 08/02/2015 1055   CREATININE 1.13 05/17/2014 1834   CALCIUM 8.5* 08/02/2015 1055   CALCIUM 8.7 05/17/2014 1834   GFRNONAA 28* 08/02/2015 1055   GFRNONAA 49* 05/17/2014 1834   GFRNONAA 35* 11/29/2012 1335   GFRNONAA 35* 11/29/2012 1335   GFRAA 33* 08/02/2015 1055   GFRAA 59* 05/17/2014 1834   GFRAA 41* 11/29/2012 1335   GFRAA 40* 11/29/2012 1335   Assessment/Planning: 82 year old female with PMHx of a-fib who presented to ED with Right Lower Extremity: There is near occlusion of the common iliac on the right. Internal and external iliac arteries as well as the common femoral profunda femoris SFA and popliteal widely patent. Trifurcation is patent. Left Lower Extremity: There is occlusion of the entire common iliac on the left. Internal and external iliac arteries as well as the common femoral profunda femoris SFA and popliteal widely patent. Trifurcation is patent s/p multiple intervnetions including thrombolysis, bilateral stent placement with take back for thromboembolectomy.  1) First VAC change today. Without complication. 2) If stable off of neo may be able to transfer out of the  unit  3) Physical therapy.  4) Most likely discharge to [employer] PA-C 08/02/2015 1:32 PM

## 2015-08-03 ENCOUNTER — Inpatient Hospital Stay: Payer: PPO

## 2015-08-03 LAB — BASIC METABOLIC PANEL
Anion gap: 5 (ref 5–15)
BUN: 37 mg/dL — AB (ref 6–20)
CHLORIDE: 106 mmol/L (ref 101–111)
CO2: 27 mmol/L (ref 22–32)
Calcium: 8.6 mg/dL — ABNORMAL LOW (ref 8.9–10.3)
Creatinine, Ser: 1.67 mg/dL — ABNORMAL HIGH (ref 0.44–1.00)
GFR calc Af Amer: 31 mL/min — ABNORMAL LOW (ref >60)
GFR calc non Af Amer: 27 mL/min — ABNORMAL LOW (ref >60)
Glucose, Bld: 170 mg/dL — ABNORMAL HIGH (ref 65–99)
POTASSIUM: 3.4 mmol/L — AB (ref 3.5–5.1)
SODIUM: 138 mmol/L (ref 135–145)

## 2015-08-03 LAB — CBC
HEMATOCRIT: 26.6 % — AB (ref 35.0–47.0)
HEMOGLOBIN: 8.6 g/dL — AB (ref 12.0–16.0)
MCH: 29.6 pg (ref 26.0–34.0)
MCHC: 32.1 g/dL (ref 32.0–36.0)
MCV: 92 fL (ref 80.0–100.0)
Platelets: 303 10*3/uL (ref 150–440)
RBC: 2.89 MIL/uL — AB (ref 3.80–5.20)
RDW: 14.8 % — ABNORMAL HIGH (ref 11.5–14.5)
WBC: 16.4 10*3/uL — ABNORMAL HIGH (ref 3.6–11.0)

## 2015-08-03 LAB — MAGNESIUM: MAGNESIUM: 1.7 mg/dL (ref 1.7–2.4)

## 2015-08-03 LAB — BRAIN NATRIURETIC PEPTIDE

## 2015-08-03 MED ORDER — POTASSIUM CHLORIDE CRYS ER 20 MEQ PO TBCR
20.0000 meq | EXTENDED_RELEASE_TABLET | Freq: Once | ORAL | Status: AC
  Administered 2015-08-03: 20 meq via ORAL
  Filled 2015-08-03: qty 1

## 2015-08-03 MED ORDER — FUROSEMIDE 10 MG/ML IJ SOLN
20.0000 mg | Freq: Two times a day (BID) | INTRAMUSCULAR | Status: DC
  Administered 2015-08-03 – 2015-08-05 (×5): 20 mg via INTRAVENOUS
  Filled 2015-08-03 (×5): qty 2

## 2015-08-03 MED ORDER — MAGNESIUM SULFATE 2 GM/50ML IV SOLN
2.0000 g | Freq: Once | INTRAVENOUS | Status: AC
  Administered 2015-08-03: 2 g via INTRAVENOUS
  Filled 2015-08-03: qty 50

## 2015-08-03 MED ORDER — FENTANYL CITRATE (PF) 100 MCG/2ML IJ SOLN: 25.0000 ug | INTRAMUSCULAR | Status: DC | PRN

## 2015-08-03 MED ORDER — ONDANSETRON HCL 4 MG/2ML IJ SOLN: 4.0000 mg | Freq: Once | INTRAMUSCULAR | Status: DC | PRN

## 2015-08-03 NOTE — Consult Note (Addendum)
[facility] Consultation      Name: [name] CONTRERAZ MRN: [id number] DOB: 1929/08/27    ADMISSION DATE:  07/28/2015  CHIEF COMPLAINT:   Acute resp failure-resolved   HISTORY OF PRESENT ILLNESS  Doing well. No acute issues overnight. On 1L [place]. Off vasopressors,  Denies chest pain, sob/sobe, dizziness and headache but reports moderate left groin pain. Remains on amio gtte; HR in the low 100s.   Review of Systems  Constitutional: Positive for malaise/fatigue. Negative for fever and chills.  HENT: Negative for congestion.   Eyes: Negative for blurred vision.  Respiratory: Negative for cough, hemoptysis, sputum production, shortness of breath and wheezing.   Cardiovascular: Negative for chest pain.  Gastrointestinal: Negative for heartburn.  Genitourinary: Negative for dysuria.  Musculoskeletal: Negative for myalgias.  Neurological: Negative for dizziness and headaches.  Psychiatric/Behavioral: The patient is nervous/anxious.       VITAL SIGNS    Temp:  [97.5 F (36.4 C)-97.9 F (36.6 C)] 97.8 F (36.6 C) (04/08 0400) Pulse Rate:  [66-133] 91 (04/08 0400) Resp:  [17-39] 25 (04/08 0400) BP: (84-124)/(38-95) 100/58 mmHg (04/08 0400) SpO2:  [93 %-100 %] 97 % (04/08 0400) Weight:  [223 lb 5.2 oz (101.3 kg)] 223 lb 5.2 oz (101.3 kg) (04/08 0455) HEMODYNAMICS: CVP:  [6 mmHg-12 mmHg] 6 mmHg VENTILATOR SETTINGS:   INTAKE / OUTPUT:  Intake/Output Summary (Last 24 hours) at 08/03/15 [id number] Last data filed at 08/02/15 1448  Gross per 24 hour  Intake     89 ml  Output      0 ml  Net     89 ml       PHYSICAL EXAM   Physical Exam  Constitutional: She is oriented to person, place, and time. No distress.  HENT:  Head: Normocephalic and atraumatic.  Eyes: Pupils are equal, round, and reactive to light. No scleral icterus.  Neck: Normal range of motion.  Cardiovascular: Normal rate and regular rhythm.   No murmur heard. Pulmonary/Chest: No stridor. No  respiratory distress. She has no wheezes. She has no rales.  Abdominal: Soft. She exhibits no distension. There is no tenderness.  Musculoskeletal: She exhibits no edema.  Wound vac in left leg, compressive device RT leg  Neurological: She is alert and oriented to person, place, and time. She displays normal reflexes. Coordination normal.  Skin: Skin is warm. No rash noted. She is not diaphoretic.       LABS   LABS:  CBC  Recent Labs Lab 08/01/15 0740 08/02/15 0500 08/03/15 0510  WBC 11.5* 12.8* 16.4*  HGB 8.2* 8.6* 8.6*  HCT 24.8* 25.9* 26.6*  PLT 223 246 303   Coag's  Recent Labs Lab 07/28/15 1514  07/31/15 2053 08/01/15 0039 08/01/15 0740  APTT 37*  < > 31 34 29  INR 1.16  --   --   --   --   < > = values in this interval not displayed. BMET  Recent Labs Lab 08/01/15 2247 08/02/15 1055 08/03/15 0510  NA 138 138 138  K 2.8* 3.7 3.4*  CL 108 107 106  CO2 30 25 27   BUN 32* 33* 37*  CREATININE 1.58* 1.60* 1.67*  GLUCOSE 157* 185* 170*   Electrolytes  Recent Labs Lab 08/01/15 2247 08/02/15 1055 08/03/15 0510  CALCIUM 8.0* 8.5* 8.6*  MG  --  1.8 1.7  PHOS  --  2.6  --    Sepsis Markers  Recent Labs Lab 07/30/15 0327  LATICACIDVEN 1.7  ABG  Recent Labs Lab 07/29/15 2315 07/30/15 0503  PHART 7.53* 7.34*  PCO2ART 33 52*  PO2ART 102 107   Liver Enzymes No results for input(s): AST, ALT, ALKPHOS, BILITOT, ALBUMIN in the last 168 hours. Cardiac Enzymes  Recent Labs Lab 07/30/15 0327 08/01/15 1611  TROPONINI 2.44* 0.26*   Glucose No results for input(s): GLUCAP in the last 168 hours.   Recent Results (from the past 240 hour(s))  MRSA PCR Screening     Status: Abnormal   Collection Time: 07/28/15 10:00 PM  Result Value Ref Range Status   MRSA by PCR POSITIVE (A) NEGATIVE Final    Comment:        The GeneXpert MRSA Assay (FDA approved for NASAL specimens only), is one component of a comprehensive MRSA  colonization surveillance program. It is not intended to diagnose MRSA infection nor to guide or monitor treatment for MRSA infections. CRITICAL RESULT CALLED TO, READ BACK BY AND VERIFIED WITH: CALLED TO LISA ROMERO @ [id number] ON 07/28/2015 BY CAF      Current facility-administered medications:  .  0.9 %  sodium chloride infusion, , Intravenous, Continuous, Algernon Huxley, MD, Last Rate: 10 mL/hr at 07/31/15 0700 .  acetaminophen (TYLENOL) tablet 650 mg, 650 mg, Oral, Q6H PRN **OR** acetaminophen (TYLENOL) suppository 650 mg, 650 mg, Rectal, Q6H PRN, Katha Cabal, MD .  albuterol (PROVENTIL) (2.5 MG/3ML) 0.083% nebulizer solution 3 mL, 3 mL, Inhalation, Q6H PRN, Katha Cabal, MD, 3 mL at 08/03/15 0259 .  [COMPLETED] amiodarone (NEXTERONE) 1.8 mg/mL load via infusion 150 mg, 150 mg, Intravenous, Once, 150 mg at 08/02/15 2022 **FOLLOWED BY** [EXPIRED] amiodarone (NEXTERONE PREMIX) 360 MG/200ML (1.8 mg/mL) IV infusion, 60 mg/hr, Intravenous, Continuous, Last Rate: 33.3 mL/hr at 08/02/15 2341, 60 mg/hr at 08/02/15 2341 **FOLLOWED BY** amiodarone (NEXTERONE PREMIX) 360 MG/200ML (1.8 mg/mL) IV infusion, 30 mg/hr, Intravenous, Continuous, Wellington Hampshire, MD, Last Rate: 16.7 mL/hr at 08/03/15 0246, 30 mg/hr at 08/03/15 0246 .  apixaban (ELIQUIS) tablet 2.5 mg, 2.5 mg, Oral, BID, Vena Rua, RPH, 2.5 mg at 08/02/15 2233 .  bacitracin ointment 1 application, 1 application, Topical, BID, Katha Cabal, MD, 1 application at 0000000 [phone] .  Chlorhexidine Gluconate Cloth 2 % PADS 6 each, 6 each, Topical, Q0600, Flora Lipps, MD, 6 each at 08/01/15 0540 .  docusate sodium (COLACE) capsule 100 mg, 100 mg, Oral, BID, Flora Lipps, MD, 100 mg at 08/02/15 2233 .  DULoxetine (CYMBALTA) DR capsule 60 mg, 60 mg, Oral, Daily, Kimberly A Stegmayer, PA-C, 60 mg at 08/02/15 1447 .  feeding supplement (ENSURE ENLIVE) (ENSURE ENLIVE) liquid 237 mL, 237 mL, Oral, BID BM, Katha Cabal, MD, 237 mL at  08/02/15 1448 .  fentaNYL (SUBLIMAZE) bolus via infusion 25 mcg, 25 mcg, Intravenous, Q1H PRN, Kara Mead V, MD .  HYDROcodone-acetaminophen (NORCO/VICODIN) 5-325 MG per tablet 1-2 tablet, 1-2 tablet, Oral, Q4H PRN, Katha Cabal, MD .  hydrocortisone sodium succinate (SOLU-CORTEF) 100 MG injection 50 mg, 50 mg, Intravenous, Q6H, Raylene Miyamoto, MD, 50 mg at 08/03/15 0244 .  levothyroxine (SYNTHROID, LEVOTHROID) tablet 75 mcg, 75 mcg, Oral, QAC breakfast, Flora Lipps, MD, 75 mcg at 08/02/15 0824 .  magnesium hydroxide (MILK OF MAGNESIA) suspension 30 mL, 30 mL, Oral, Daily PRN, Katha Cabal, MD .  morphine 2 MG/ML injection 2 mg, 2 mg, Intravenous, Q1H PRN, Katha Cabal, MD, 2 mg at 07/31/15 0131 .  mupirocin ointment (BACTROBAN) 2 % 1 application, 1  application, Nasal, BID, Flora Lipps, MD, 1 application at 0000000 2236 .  [DISCONTINUED] ondansetron (ZOFRAN) tablet 4 mg, 4 mg, Oral, Q6H PRN **OR** ondansetron (ZOFRAN) injection 4 mg, 4 mg, Intravenous, Q6H PRN, Katha Cabal, MD .  phenylephrine (NEO-SYNEPHRINE) 10 mg in dextrose 5 % 250 mL (0.04 mg/mL) infusion, 30-200 mcg/min, Intravenous, Continuous, Raylene Miyamoto, MD, Stopped at 08/02/15 1031 .  piperacillin-tazobactam (ZOSYN) IVPB 3.375 g, 3.375 g, Intravenous, 3 times per day, Raylene Miyamoto, MD, 3.375 g at 08/02/15 2235 .  polyethylene glycol (MIRALAX / GLYCOLAX) packet 17 g, 17 g, Oral, Daily PRN, Flora Lipps, MD .  QUEtiapine (SEROQUEL) tablet 25 mg, 25 mg, Oral, QHS, Flora Lipps, MD, 25 mg at 08/02/15 2233 .  sodium chloride flush (NS) 0.9 % injection 10-40 mL, 10-40 mL, Intracatheter, PRN, Katha Cabal, MD .  sodium chloride flush (NS) 0.9 % injection 3 mL, 3 mL, Intravenous, Q12H, Katha Cabal, MD, 3 mL at 08/02/15 1000 .  sodium chloride flush (NS) 0.9 % injection 3 mL, 3 mL, Intravenous, Q12H, Katha Cabal, MD, 3 mL at 08/02/15 2244 .  sodium chloride flush (NS) 0.9 % injection 3 mL, 3  mL, Intravenous, PRN, Katha Cabal, MD .  sorbitol 70 % solution 30 mL, 30 mL, Oral, Daily PRN, Katha Cabal, MD, 30 mL at 07/31/15 1639  IMAGING    No results found.  INDWELLING DEVICES:: RT IJ CVL 4/3>>>>  MICRO DATA: MRSA PCR POS  ANTIMICROBIALS: zosyn>>>   ASSESSMENT/PLAN  82 yo white female admitted for post op resp failure with hypotension-sedation vs hypovolumia Successful extubation  PULMONARY -Respiratory Failure-resolved -Continue supplemental O2 [place] prn  CARDIOVASCULAR Shock-volume depleted,sedative related-patient with inaccurate cuff pressure -Off pressors  -Continue to monitor manual BPs and encourage oral hydration as tolerated -target MAP>60  RENAL Follow chem 7 and UO  GASTROINTESTINAL Diet as tolerated  HEMATOLOGIC A Ischemic leg s/p thromboembolectomy P Vascular surgery following Anticoagulation Follow CBC Transfusion guidelines  INFECTIOUS Empiric abx   ENDOCRINE Follow FSBS   OK to transfer Total PCCM time=30 minutes  Magdalene S. Tukov ANP-BC Pulmonary and Critical Care Medicine [facility] Pager [phone]  Thank you for consulting [facility], we will signoff at this time.  Please feel free to contact us with any questions at [phone] (please enter 7-digits).

## 2015-08-03 NOTE — Progress Notes (Signed)
MEDICATION RELATED CONSULT NOTE - INITIAL   Pharmacy Consult for Electrolyte Management Indication:   Allergies  Allergen Reactions  . Betadine [Povidone Iodine] Hives  . Sulfa Antibiotics   . Valium [Diazepam] Anxiety    Patient Measurements: Height: 5' (152.4 cm) Weight: 223 lb 5.2 oz (101.3 kg) IBW/kg (Calculated) : 45.5 Adjusted Body Weight:   Vital Signs: Temp: 97.7 F (36.5 C) (04/08 1158) Temp Source: Oral (04/08 1158) BP: 145/84 mmHg (04/08 1158) Pulse Rate: 126 (04/08 1158) Intake/Output from previous day: 04/07 0701 - 04/08 0700 In: 89 [I.V.:39; IV Piggyback:50] Out: -  Intake/Output from this shift: Total I/O In: 106 [I.V.:6; IV Piggyback:100] Out: -   Labs:  Recent Labs  07/31/15 2053 08/01/15 0039 08/01/15 0740 08/01/15 2247 08/02/15 0500 08/02/15 1055 08/03/15 0510  WBC  --   --  11.5*  --  12.8*  --  16.4*  HGB  --   --  8.2*  --  8.6*  --  8.6*  HCT  --   --  24.8*  --  25.9*  --  26.6*  PLT  --   --  223  --  246  --  303  APTT 31 34 29  --   --   --   --   CREATININE  --   --   --  1.58*  --  1.60* 1.67*  MG  --   --   --   --   --  1.8 1.7  PHOS  --   --   --   --   --  2.6  --     Lab Results  Component Value Date   K 3.4* 08/03/2015   .last Estimated Creatinine Clearance: 26.8 mL/min (by C-G formula based on Cr of 1.67).   Medical History: Past Medical History  Diagnosis Date  . Hypertension   . Thyroid disorder   . Lymphoma ([place])   . Hypothyroidism   . Arthritis   . Diverticulosis   . Obstructive sleep apnea   . Myasthenia gravis ([place])   . Hip fracture, left ([place])   . Fracture of right humerus   . Asthma   . PAF (paroxysmal atrial fibrillation) ([place])     a. diagnosed in 2016. Refused anticoagulation  . Chronic systolic CHF (congestive heart failure) ([place])     a. 07/2015: EF 35-40% with hypokinesis of the anterior and apical myocardium  . Ischemic leg     a. 07/2015: s/p thrombolysis and open embolectomy of left  iliofemoral system    Medications:  Scheduled:  . apixaban  2.5 mg Oral BID  . bacitracin  1 application Topical BID  . Chlorhexidine Gluconate Cloth  6 each Topical Q0600  . docusate sodium  100 mg Oral BID  . DULoxetine  60 mg Oral Daily  . feeding supplement (ENSURE ENLIVE)  237 mL Oral BID BM  . furosemide  20 mg Intravenous Q12H  . hydrocortisone sodium succinate  50 mg Intravenous Q6H  . levothyroxine  75 mcg Oral QAC breakfast  . magnesium sulfate 1 - 4 g bolus IVPB  2 g Intravenous Once  . mupirocin ointment  1 application Nasal BID  . piperacillin-tazobactam (ZOSYN)  IV  3.375 g Intravenous 3 times per day  . potassium chloride  20 mEq Oral Once  . QUEtiapine  25 mg Oral QHS  . sodium chloride flush  3 mL Intravenous Q12H  . sodium chloride flush  3 mL Intravenous Q12H  Assessment: 82 yo female admitted with hypokalemia, s/sp thrombolysis/open embolectomy 07/29/15. CHF, Afib.  K=3.4 Mag= 1.7  Plan:  Will give KCL po 20 meq x 1. Will give Magnesium 2 gram IV x 1 as Mag is trending down.  Keatyn Jawad A 08/03/2015,3:34 PM

## 2015-08-03 NOTE — Progress Notes (Signed)
Update: Patient noted to have DNR/DNI yellow form in the the chart. Verified code status with patient, she stated that she is DNR/DNI Will update order in EMR.   RN witness - Orland Jarred, MD [facility] Pager [phone] (please enter 7-digits) On Call Pager - [phone] (please enter 7-digits)

## 2015-08-03 NOTE — Progress Notes (Signed)
Hospital Problem List     Active Problems:   Ischemic leg   Paroxysmal atrial fibrillation (HCC)   NSTEMI (non-ST elevated myocardial infarction) [facility])    Patient Profile:   Primary Cardiologist: New to [facility] - Dr. Fletcher Anon  82 y.o. female with past medical history of PAF (not on anticoagulation), HTN, Hypothyroidism, Myasthenia Gravis, OSA, and Diverticulosis who presented to [facility] on 07/28/2015 for evaluation of left leg pain. Found to have embolic occlusion of left iliofemoral system, now s/p thrombolysis and open embolectomy on 07/29/2015.  Subjective   More alert today. Denies any pain in her lower extremities. Denies any chest pain, palpitations, or shortness of breath.  Inpatient Medications    . apixaban  2.5 mg Oral BID  . bacitracin  1 application Topical BID  . Chlorhexidine Gluconate Cloth  6 each Topical Q0600  . docusate sodium  100 mg Oral BID  . DULoxetine  60 mg Oral Daily  . feeding supplement (ENSURE ENLIVE)  237 mL Oral BID BM  . hydrocortisone sodium succinate  50 mg Intravenous Q6H  . levothyroxine  75 mcg Oral QAC breakfast  . mupirocin ointment  1 application Nasal BID  . piperacillin-tazobactam (ZOSYN)  IV  3.375 g Intravenous 3 times per day  . QUEtiapine  25 mg Oral QHS  . sodium chloride flush  3 mL Intravenous Q12H  . sodium chloride flush  3 mL Intravenous Q12H    Vital Signs    Filed Vitals:   08/03/15 0600 08/03/15 0700 08/03/15 0800 08/03/15 0900  BP:  102/55 115/76 126/94  Pulse: 86 104 118 67  Temp:      TempSrc:    Oral  Resp: 25 29 32 34  Height:      Weight:      SpO2: 100% 100% 100% 96%    Intake/Output Summary (Last 24 hours) at 08/03/15 0951 Last data filed at 08/03/15 0800  Gross per 24 hour  Intake    153 ml  Output      0 ml  Net    153 ml   Filed Weights   07/28/15 2002 08/01/15 0606 08/03/15 0455  Weight: 201 lb (91.173 kg) 208 lb 8.9 oz (94.6 kg) 223 lb 5.2 oz (101.3 kg)    Physical Exam    General: Elderly  Caucasian female appearing in no acute distress. Head: Normocephalic, atraumatic.  Neck: Supple without bruits, JVD not elevated. Lungs:  Resp regular and unlabored, mild expiratory wheeze noted in upper lung fields. Heart: Regular rhythm, tachycardiac rate, S1, S2, no S3, S4, or murmur; no rub. Abdomen: Soft, non-tender, non-distended with normoactive bowel sounds. No hepatomegaly. No rebound/guarding. No obvious abdominal masses. Extremities: No clubbing, cyanosis, or edema. Distal pedal pulses are 1+ bilaterally. Drains in place. Neuro: Alert and oriented X 3. Moves all extremities spontaneously. Psych: Normal affect.  Labs    CBC  Recent Labs  08/02/15 0500 08/03/15 0510  WBC 12.8* 16.4*  HGB 8.6* 8.6*  HCT 25.9* 26.6*  MCV 91.2 92.0  PLT 246 [id number]   Basic Metabolic Panel  Recent Labs  08/02/15 1055 08/03/15 0510  NA 138 138  K 3.7 3.4*  CL 107 106  CO2 25 27  GLUCOSE 185* 170*  BUN 33* 37*  CREATININE 1.60* 1.67*  CALCIUM 8.5* 8.6*  MG 1.8 1.7  PHOS 2.6  --    Liver Function Tests No results for input(s): AST, ALT, ALKPHOS, BILITOT, PROT, ALBUMIN in the last 72 hours. No  results for input(s): LIPASE, AMYLASE in the last 72 hours. Cardiac Enzymes  Recent Labs  08/01/15 1611  TROPONINI 0.26*   Thyroid Function Tests  Recent Labs  08/02/15 0500  TSH 1.203    Telemetry    Sinus tachycardia, HR in 110's, peaking into 140's.   Cardiac Studies and Radiology   Ct Head Wo Contrast: 08/01/2015  CLINICAL DATA:  Recent mental status changes.  Confusion. EXAM: CT HEAD WITHOUT CONTRAST TECHNIQUE: Contiguous axial images were obtained from the base of the skull through the vertex without intravenous contrast. COMPARISON:  09/12/2014.  09/11/2014. FINDINGS: There is generalized brain atrophy. There chronic small-vessel ischemic changes affecting the deep white matter. No sign of acute infarction. No mass lesion, hemorrhage or extra-axial collection. Chronic  ventriculomegaly is felt secondary to central atrophy. This appears unchanged. No calvarial abnormality. Sinuses, middle ears and mastoids are clear. IMPRESSION: No acute or reversible finding. Atrophy and chronic small-vessel ischemic changes of the deep white matter. Chronic ventriculomegaly felt secondary to central atrophy. Electronically Signed   By: Nelson Chimes M.D.   On: 08/01/2015 11:36   Dg Chest Port 1 View: 07/30/2015  CLINICAL DATA:  Weakness. EXAM: PORTABLE CHEST 1 VIEW COMPARISON:  07/29/2015. FINDINGS: Endotracheal tube repositioned 2.5 cm above the carina. Right IJ line and stable position. Mediastinum and hilar structures are normal. Stable cardiomegaly . Bibasilar atelectasis and/or infiltrates. Small left pleural effusion. No pneumothorax. IMPRESSION: 1. Interim repositioning of endotracheal tube, its tip is now 2.5 cm above the carina in good anatomic position. Right IJ line stable position. 2. Bibasilar atelectasis and/or infiltrates. Small left pleural effusion. No interim change from prior exam . Electronically Signed   By: Marcello Moores  Register   On: 07/30/2015 07:57   Echocardiogram: 07/31/2015 Study Conclusions - Left ventricle: Systolic function was moderately reduced. The  estimated ejection fraction was in the range of 35% to 40%.  Hypokinesis of the anterior myocardium. Hypokinesis of the apical  myocardium. - Mitral valve: Calcified annulus. Mildly thickened leaflets .  There was mild regurgitation. - Left atrium: The atrium was mildly dilated. - Tricuspid valve: There was moderate regurgitation.  Assessment & Plan    1. Paroxysmal Atrial Fibrillation - atrial fibrillation initially noted in our records in 08/2014. The patient and her family declined further cardiac evaluation at that time and the need for anticoagulation.  - This patients CHA2DS2-VASc Score and unadjusted Ischemic Stroke Rate (% per year) is equal to 11.2 % stroke rate/year from a score of 7 (CHF, HTN,  Female, Age (2), TE (2)). Currently on Heparin. Talked with the patient about long-term anticoagulation this AM and she understands the reasoning for starting this. Currently on Eliquis Appears to be in sinus rhythm, Karole Oo get ECG to confirm this AM.  2. Chronic Systolic CHF - echo this admission shows of EF of 35-40% with hypokinesis of the anterior and apical myocardium. EF was noted to be decreased in the past as well. - the patient and her family have declined further cardiac workup in the past. She is unsure of further workup at this time. Is in agreement for long-term anticoagulation. Ivionna Verley discuss further cardiac workup when family is present and she is more alert.  3. Hypotension - BP has been 94/38 - 125/92 in the past 24 hours. - BP improved currently  4. Ischemic Leg - s/p open embolectomy of left iliofemoral system - per Vascular Surgery  5. Anemia - Hgb 13.4 pre-op but reached a trough of 8.2 on 08/01/2015.  Trending up to 8.6 today. - continue to monitor  6. Elevated Troponin - elevated to 2.44 on admission. Trending down to 0.26 on 08/01/2015. - denies any recent anginal symptoms. Likely secondary to demand ischemic in the setting of ischemic leg and known CHF. - the patient and her family have declined further cardiac workup in the past.  Signed, Sharonne Ricketts Meredith Leeds , MD  9:51 AM 08/03/2015

## 2015-08-03 NOTE — Progress Notes (Signed)
Pt began having wheezing respiratory sounds. PRN breathing treatment was given. Pt resting comfortably.

## 2015-08-03 NOTE — Progress Notes (Signed)
Vein & Vascular Surgery  Daily Progress Note   Subjective: 5 Days Post-Op: Introduction catheter into aorta right common femoral artery approach, Introduction catheter into aorta left common femoral artery approach and Initiation of arterial thrombolysis with take back for second look angiography with aortogram and bilateral iliofemoral angiograms, catheter placement into aorta from bilateral femoral approaches, mechanical rheolytic thrombectomy of the aorta, left iliac artery, and left common femoral artery and bilateral iliac artery stent placements to reconstruct the distal aorta and proximal iliac arteries with take back later that evening for thromboembolectomy left external iliac artery, common femoral artery, profunda femoris artery and superficial femoral artery.  Patient transferred from unit to surgical floor. Family member at bedside. Patient denies bilateral groin or lower extremity pain. Patient still confused - baseline? Some tachypnea - medicine obtaining xray. VAC intact to suction. Bilateral lower extremities warm with palpable DP pulses.   Objective: Filed Vitals:   08/03/15 0700 08/03/15 0800 08/03/15 0900 08/03/15 1158  BP: 102/55 115/76 126/94 145/84  Pulse: 104 118 67 126  Temp:   97.4 F (36.3 C) 97.7 F (36.5 C)  TempSrc:   Oral Oral  Resp: 29 32 34 20  Height:      Weight:      SpO2: 100% 100% 96% 98%    Intake/Output Summary (Last 24 hours) at 08/03/15 1417 Last data filed at 08/03/15 1000  Gross per 24 hour  Intake    156 ml  Output      0 ml  Net    156 ml   Physical Exam: Slightly confused, NAD Neck: Right IJ intact - no signs of infection CV: Tacycardic Pulmonary: CTA Bilaterally Abdomen: Soft, Nontender, Nondistended Vascular: Right Groin: healing well, skin tear located on lower abdomen. Left Groin: VAC intact, seal intact, to suction.  Bilateral lower extremities: warm, non-tender, DP pulses  palpable   Laboratory: CBC    Component Value Date/Time   WBC 16.4* 08/03/2015 0510   WBC 9.8 05/17/2014 1834   HGB 8.6* 08/03/2015 0510   HGB 11.6* 05/17/2014 1834   HCT 26.6* 08/03/2015 0510   HCT 36.6 05/17/2014 1834   PLT 303 08/03/2015 0510   PLT 242 05/17/2014 1834   BMET    Component Value Date/Time   NA 138 08/03/2015 0510   NA 137 05/17/2014 1834   K 3.4* 08/03/2015 0510   K 4.4 05/17/2014 1834   CL 106 08/03/2015 0510   CL 100 05/17/2014 1834   CO2 27 08/03/2015 0510   CO2 30 05/17/2014 1834   GLUCOSE 170* 08/03/2015 0510   GLUCOSE 99 05/17/2014 1834   BUN 37* 08/03/2015 0510   BUN 15 05/17/2014 1834   CREATININE 1.67* 08/03/2015 0510   CREATININE 1.13 05/17/2014 1834   CALCIUM 8.6* 08/03/2015 0510   CALCIUM 8.7 05/17/2014 1834   GFRNONAA 27* 08/03/2015 0510   GFRNONAA 49* 05/17/2014 1834   GFRNONAA 35* 11/29/2012 1335   GFRNONAA 35* 11/29/2012 1335   GFRAA 31* 08/03/2015 0510   GFRAA 59* 05/17/2014 1834   GFRAA 41* 11/29/2012 1335   GFRAA 40* 11/29/2012 1335   Assessment/Planning: 82 year old female with PMHx of a-fib who presented to ED with Right Lower Extremity near occlusion of the common iliac on the right. Internal and external iliac arteries as well as the common femoral profunda femoris SFA and popliteal widely patent. Trifurcation is patent. Left Lower Extremity occlusion of the entire common iliac on the left. Internal and external iliac arteries as well  as the common femoral profunda femoris SFA and popliteal widely patent. Patient is s/p multiple intervnetions including thrombolysis, bilateral stent placement with take back for thromboembolectomy - stable from vascular standpoint 1) Next VAC change Monday by nursing. 2) Medical care as per internal medicine service. 3) Physical therapy.  4) Most likely discharge to SNF  [facility] PA-C 08/03/2015 2:17 PM

## 2015-08-03 NOTE — Progress Notes (Signed)
[place] [facility]                                                                                                                                                                                            Patient Demographics   [name], is a 82 y.o. female, DOB - 08-22-1929, BO:[id number]  Admit date - 07/28/2015   Admitting Physician Katha Cabal, MD  Outpatient Primary MD for the patient is Sarajane Jews, MD   LOS - 6  Subjective:Patient complains of feeling not well over. Denies any chest pains complaining of shortness of breath     Review of Systems:   CONSTITUTIONAL: No documented fever. No fatigue, weakness. No weight gain, no weight loss.  EYES: No blurry or double vision.  ENT: No tinnitus. No postnasal drip. No redness of the oropharynx.  RESPIRATORY: No cough, no wheeze, no hemoptysis. Positive dyspnea.  CARDIOVASCULAR: No chest pain. No orthopnea. No palpitations. No syncope.  GASTROINTESTINAL: No nausea, no vomiting or diarrhea. No abdominal pain. No melena or hematochezia.  GENITOURINARY: No dysuria or hematuria.  ENDOCRINE: No polyuria or nocturia. No heat or cold intolerance.  HEMATOLOGY: No anemia. No bruising. No bleeding.  INTEGUMENTARY: No rashes. No lesions. Swelling of the upper extremity MUSCULOSKELETAL: No arthritis. No swelling. No gout.  NEUROLOGIC: No numbness, tingling, or ataxia. No seizure-type activity.  PSYCHIATRIC: No anxiety. No insomnia. No ADD.    Vitals:   Filed Vitals:   08/03/15 0700 08/03/15 0800 08/03/15 0900 08/03/15 1158  BP: 102/55 115/76 126/94 145/84  Pulse: 104 118 67 126  Temp:   97.4 F (36.3 C) 97.7 F (36.5 C)  TempSrc:   Oral Oral  Resp: 29 32 34 20  Height:      Weight:      SpO2: 100% 100% 96% 98%    Wt Readings from Last 3 Encounters:  08/03/15 101.3 kg (223 lb 5.2 oz)  09/20/14 90.719 kg (200 lb)  03/31/13 99.338 kg (219 lb)     Intake/Output Summary (Last  24 hours) at 08/03/15 1249 Last data filed at 08/03/15 1000  Gross per 24 hour  Intake    156 ml  Output      0 ml  Net    156 ml    Physical Exam:   GENERAL: Pleasant-appearing in no apparent distress.  HEAD, EYES, EARS, NOSE AND THROAT: Atraumatic, normocephalic. Extraocular muscles are intact. Pupils equal and reactive to light. Sclerae anicteric. No conjunctival injection. No oro-pharyngeal erythema.  NECK: Supple. There is no jugular venous distention. No bruits, no lymphadenopathy, no thyromegaly.  HEART:  Regular rate and rhythm,. No murmurs, no rubs, no clicks.  LUNGS: Clear to auscultation bilaterally. No rales or rhonchi. No wheezes.  ABDOMEN: Soft, flat, nontender, nondistended. Has good bowel sounds. No hepatosplenomegaly appreciated.  EXTREMITIES: No evidence of any cyanosis, clubbing, 1+ edema of the lower extremity NEUROLOGIC: The patient is alert, awake, and oriented x3 with no focal motor or sensory deficits appreciated bilaterally.  SKIN: Moist and warm with no rashes appreciated.  Psych: Not anxious, depressed LN: No inguinal LN enlargement    Antibiotics   Anti-infectives    Start     Dose/Rate Route Frequency Ordered Stop   07/30/15 0600  piperacillin-tazobactam (ZOSYN) IVPB 3.375 g     3.375 g 12.5 mL/hr over 240 Minutes Intravenous 3 times per day 07/30/15 0403     07/29/15 2230  cefUROXime (ZINACEF) 1.5 g in dextrose 5 % 50 mL IVPB     1.5 g 100 mL/hr over 30 Minutes Intravenous Every 12 hours 07/29/15 2218 07/30/15 1121      Medications   Scheduled Meds: . apixaban  2.5 mg Oral BID  . bacitracin  1 application Topical BID  . Chlorhexidine Gluconate Cloth  6 each Topical Q0600  . docusate sodium  100 mg Oral BID  . DULoxetine  60 mg Oral Daily  . feeding supplement (ENSURE ENLIVE)  237 mL Oral BID BM  . furosemide  20 mg Intravenous Q12H  . hydrocortisone sodium succinate  50 mg Intravenous Q6H  . levothyroxine  75 mcg Oral QAC breakfast  .  mupirocin ointment  1 application Nasal BID  . piperacillin-tazobactam (ZOSYN)  IV  3.375 g Intravenous 3 times per day  . QUEtiapine  25 mg Oral QHS  . sodium chloride flush  3 mL Intravenous Q12H  . sodium chloride flush  3 mL Intravenous Q12H   Continuous Infusions: . sodium chloride 10 mL/hr at 07/31/15 0700  . amiodarone 30 mg/hr (08/03/15 0853)   PRN Meds:.acetaminophen **OR** acetaminophen, albuterol, fentaNYL, fentaNYL (SUBLIMAZE) injection, HYDROcodone-acetaminophen, magnesium hydroxide, morphine injection, [DISCONTINUED] ondansetron **OR** ondansetron (ZOFRAN) IV, ondansetron (ZOFRAN) IV, polyethylene glycol, sodium chloride flush, sodium chloride flush, sorbitol   Data Review:   Micro Results Recent Results (from the past 240 hour(s))  MRSA PCR Screening     Status: Abnormal   Collection Time: 07/28/15 10:00 PM  Result Value Ref Range Status   MRSA by PCR POSITIVE (A) NEGATIVE Final    Comment:        The GeneXpert MRSA Assay (FDA approved for NASAL specimens only), is one component of a comprehensive MRSA colonization surveillance program. It is not intended to diagnose MRSA infection nor to guide or monitor treatment for MRSA infections. CRITICAL RESULT CALLED TO, READ BACK BY AND VERIFIED WITH: CALLED TO LISA ROMERO @ [id number] ON 07/28/2015 BY [facility]     Radiology Reports Dg Lumbar Spine 2-3 Views  07/28/2015  CLINICAL DATA:  Low back and left hip pain.  No time course given. EXAM: LUMBAR SPINE - 2-3 VIEW; DG HIP (WITH OR WITHOUT PELVIS) 2-3V LEFT COMPARISON:  CT scan 09/19/2014 FINDINGS: Lumbar spine: Left convex lumbar scoliosis and degenerative lumbar spondylosis. Stable vertebral augmentation changes at L2. There is also a remote compression fracture of L1. No acute bony findings. Stable advanced facet disease. No obvious pars defects. Stable aortic and iliac artery calcifications. The visualized bony pelvis is intact. Pelvis/left hip: Both hips are normally located.  Moderate degenerative changes bilaterally. The pubic symphysis and SI joints are intact. No  pelvic fractures. Remote intra medullary rod and proximal dynamic hip screw transfixing a remote intertrochanteric fracture. No acute bony findings. The hardware is intact. IMPRESSION: 1. Remote lumbar compression fractures. No acute abnormality. Stable scoliosis and degenerative lumbar spondylosis. 2. Intact left hip hardware.  No acute hip or pelvic fracture. Electronically Signed   By: Marijo Sanes M.D.   On: 07/28/2015 16:25   Ct Head Wo Contrast  08/01/2015  CLINICAL DATA:  Recent mental status changes.  Confusion. EXAM: CT HEAD WITHOUT CONTRAST TECHNIQUE: Contiguous axial images were obtained from the base of the skull through the vertex without intravenous contrast. COMPARISON:  09/12/2014.  09/11/2014. FINDINGS: There is generalized brain atrophy. There chronic small-vessel ischemic changes affecting the deep white matter. No sign of acute infarction. No mass lesion, hemorrhage or extra-axial collection. Chronic ventriculomegaly is felt secondary to central atrophy. This appears unchanged. No calvarial abnormality. Sinuses, middle ears and mastoids are clear. IMPRESSION: No acute or reversible finding. Atrophy and chronic small-vessel ischemic changes of the deep white matter. Chronic ventriculomegaly felt secondary to central atrophy. Electronically Signed   By: Nelson Chimes M.D.   On: 08/01/2015 11:36   US Venous Img Lower Bilateral  07/28/2015  CLINICAL DATA:  Bilateral leg pain EXAM: BILATERAL LOWER EXTREMITY VENOUS DOPPLER ULTRASOUND TECHNIQUE: Gray-scale sonography with graded compression, as well as color Doppler and duplex ultrasound were performed to evaluate the lower extremity deep venous systems from the level of the common femoral vein and including the common femoral, femoral, profunda femoral, popliteal and calf veins including the posterior tibial, peroneal and gastrocnemius veins when visible.  The superficial great saphenous vein was also interrogated. Spectral Doppler was utilized to evaluate flow at rest and with distal augmentation maneuvers in the common femoral, femoral and popliteal veins. COMPARISON:  None. FINDINGS: RIGHT LOWER EXTREMITY Common Femoral Vein: No evidence of thrombus. Normal compressibility, respiratory phasicity and response to augmentation. Saphenofemoral Junction: No evidence of thrombus. Normal compressibility and flow on color Doppler imaging. Profunda Femoral Vein: No evidence of thrombus. Normal compressibility and flow on color Doppler imaging. Femoral Vein: No evidence of thrombus. Normal compressibility, respiratory phasicity and response to augmentation. Popliteal Vein: No evidence of thrombus. Normal compressibility, respiratory phasicity and response to augmentation. Calf Veins: No evidence of thrombus. Normal compressibility and flow on color Doppler imaging. Superficial Great Saphenous Vein: No evidence of thrombus. Normal compressibility and flow on color Doppler imaging. Venous Reflux:  None. Other Findings:  None. LEFT LOWER EXTREMITY Common Femoral Vein: No evidence of thrombus. Normal compressibility, respiratory phasicity and response to augmentation. Saphenofemoral Junction: No evidence of thrombus. Normal compressibility and flow on color Doppler imaging. Profunda Femoral Vein: No evidence of thrombus. Normal compressibility and flow on color Doppler imaging. Femoral Vein: No evidence of thrombus. Normal compressibility, respiratory phasicity and response to augmentation. Popliteal Vein: No evidence of thrombus. Normal compressibility, respiratory phasicity and response to augmentation. Calf Veins: No evidence of thrombus. Normal compressibility and flow on color Doppler imaging. Superficial Great Saphenous Vein: No evidence of thrombus. Normal compressibility and flow on color Doppler imaging. Venous Reflux:  None. Other Findings:  None. IMPRESSION: No  evidence of deep venous thrombosis. Electronically Signed   By: Inez Catalina M.D.   On: 07/28/2015 19:11   Dg Chest Port 1 View  07/30/2015  CLINICAL DATA:  Weakness. EXAM: PORTABLE CHEST 1 VIEW COMPARISON:  07/29/2015. FINDINGS: Endotracheal tube repositioned 2.5 cm above the carina. Right IJ line and stable position. Mediastinum and hilar structures are  normal. Stable cardiomegaly . Bibasilar atelectasis and/or infiltrates. Small left pleural effusion. No pneumothorax. IMPRESSION: 1. Interim repositioning of endotracheal tube, its tip is now 2.5 cm above the carina in good anatomic position. Right IJ line stable position. 2. Bibasilar atelectasis and/or infiltrates. Small left pleural effusion. No interim change from prior exam . Electronically Signed   By: [place]   On: 07/30/2015 07:57   Portable Chest Xray  07/29/2015  CLINICAL DATA:  Intubation. EXAM: PORTABLE CHEST 1 VIEW COMPARISON:  09/11/2014 FINDINGS: The endotracheal tube is at the level of the carina, directed toward the right mainstem. There is a right jugular central line with tip in the low SVC. There is no pneumothorax. There is left base consolidation due to atelectasis or infiltrate. Right lung is grossly clear. Pulmonary vasculature is normal. IMPRESSION: 1. Endotracheal tube is at the level of the carina, directed to the right mainstem bronchus origin. Recommend withdrawing the tube 2-3 cm. 2. Right jugular central line.  No pneumothorax. 3. Left base consolidation. Electronically Signed   By: Andreas Newport M.D.   On: 07/29/2015 23:23   Dg Hip Unilat With Pelvis 2-3 Views Left  07/28/2015  CLINICAL DATA:  Low back and left hip pain.  No time course given. EXAM: LUMBAR SPINE - 2-3 VIEW; DG HIP (WITH OR WITHOUT PELVIS) 2-3V LEFT COMPARISON:  CT scan 09/19/2014 FINDINGS: Lumbar spine: Left convex lumbar scoliosis and degenerative lumbar spondylosis. Stable vertebral augmentation changes at L2. There is also a remote compression  fracture of L1. No acute bony findings. Stable advanced facet disease. No obvious pars defects. Stable aortic and iliac artery calcifications. The visualized bony pelvis is intact. Pelvis/left hip: Both hips are normally located. Moderate degenerative changes bilaterally. The pubic symphysis and SI joints are intact. No pelvic fractures. Remote intra medullary rod and proximal dynamic hip screw transfixing a remote intertrochanteric fracture. No acute bony findings. The hardware is intact. IMPRESSION: 1. Remote lumbar compression fractures. No acute abnormality. Stable scoliosis and degenerative lumbar spondylosis. 2. Intact left hip hardware.  No acute hip or pelvic fracture. Electronically Signed   By: Marijo Sanes M.D.   On: 07/28/2015 16:25     CBC  Recent Labs Lab 07/30/15 0105 07/31/15 1158 08/01/15 0740 08/02/15 0500 08/03/15 0510  WBC 15.8* 13.3* 11.5* 12.8* 16.4*  HGB 10.8* 8.3* 8.2* 8.6* 8.6*  HCT 32.0* 25.3* 24.8* 25.9* 26.6*  PLT 195 222 223 246 303  MCV 90.1 89.9 91.8 91.2 92.0  MCH 30.4 29.7 30.2 30.1 29.6  MCHC 33.7 33.0 32.9 33.0 32.1  RDW 14.5 14.8* 14.5 14.7* 14.8*    Chemistries   Recent Labs Lab 07/28/15 1316 07/29/15 0432 08/01/15 2247 08/02/15 1055 08/03/15 0510  NA 132* 136 138 138 138  K 4.0 4.0 2.8* 3.7 3.4*  CL 98* 101 108 107 106  CO2 27 31 30 25 27   GLUCOSE 97 149* 157* 185* 170*  BUN 28* 29* 32* 33* 37*  CREATININE 1.40* 1.24* 1.58* 1.60* 1.67*  CALCIUM 8.9 8.7* 8.0* 8.5* 8.6*  MG  --   --   --  1.8 1.7   ------------------------------------------------------------------------------------------------------------------ estimated creatinine clearance is 26.8 mL/min (by C-G formula based on Cr of 1.67). ------------------------------------------------------------------------------------------------------------------ No results for input(s): HGBA1C in the last 72  hours. ------------------------------------------------------------------------------------------------------------------ No results for input(s): CHOL, HDL, LDLCALC, TRIG, CHOLHDL, LDLDIRECT in the last 72 hours. ------------------------------------------------------------------------------------------------------------------  Recent Labs  08/02/15 0500  TSH 1.203   ------------------------------------------------------------------------------------------------------------------ No results for input(s): VITAMINB12, FOLATE, FERRITIN,  TIBC, IRON, RETICCTPCT in the last 72 hours.  Coagulation profile  Recent Labs Lab 07/28/15 1514  INR 1.16    No results for input(s): DDIMER in the last 72 hours.  Cardiac Enzymes  Recent Labs Lab 07/30/15 0327 08/01/15 1611  TROPONINI 2.44* 0.26*   ------------------------------------------------------------------------------------------------------------------ Invalid input(s): POCBNP    Assessment & Plan  The patient is a 82 year old white female with history of paroxysmal atrial fibrillation, , hypertension, hypothyroidism, myasthenia gravis, obstructive sleep apnea who was evaluated for left leg pain. 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 Status post thrombolysis and open embolectomy on 07/29/2015. Postop patient developed acute respiratory failure and hypotension. She was followed by the intensivist now now we're asked to see the patient in consultation for the vascular surgery  1. Shortness of breath with acute respiratory failure: Patient appears still tacpneic-I will obtain a x-ray of her lungs I suspect this is due to diastolic CHF will give her her IV Lasix  2. Acute on chronic systolic CHF start IV Lasix, check a BMP level, monitor ins and outs post diuresis  3. Atrial fibrillation currently on amiodarone drip cardiology following Started on apixaban  4. Ischemic leg status post embolectomy primary care  team and vascular is addressing  5. Anemia due to acute blood loss as a result of surgery monitor hemoglobin  6. Elevated troponin due to demand ischemia  7. Hypothyroidism continue Synthroid as taking at home stable  8. Miscellaneous Eliquis for DVT prophylaxis      Code Status Orders        Start     Ordered   08/03/15 1032  Do not attempt resuscitation (DNR)   Continuous    Question Answer Comment  In the event of cardiac or respiratory ARREST Do not call a "code blue"   In the event of cardiac or respiratory ARREST Do not perform Intubation, CPR, defibrillation or ACLS   In the event of cardiac or respiratory ARREST Use medication by any route, position, wound care, and other measures to relive pain and suffering. May use oxygen, suction and manual treatment of airway obstruction as needed for comfort.      08/03/15 1031    Code Status History    Date Active Date Inactive Code Status Order ID Comments User Context   07/28/2015  5:52 PM 08/03/2015 10:31 AM Full Code [id number]  Katha Cabal, MD ED   09/12/2014  2:52 AM 09/20/2014  7:49 PM DNR [id number]  Juluis Mire, MD Inpatient             DVT Prophylaxis  eliquis  Lab Results  Component Value Date   PLT 303 08/03/2015     Time Spent in minutes   88min  Greater than 50% of time spent in care coordination and counseling patient regarding the condition and plan of care.   Dustin Flock M.D on 08/03/2015 at 12:49 PM  Between 7am to 6pm - Pager - [phone]  After 6pm go to [email or web address] - password [email or web address] [facility]   Office  [phone]

## 2015-08-04 LAB — BASIC METABOLIC PANEL
Anion gap: 4 — ABNORMAL LOW (ref 5–15)
BUN: 40 mg/dL — ABNORMAL HIGH (ref 6–20)
CALCIUM: 8.4 mg/dL — AB (ref 8.9–10.3)
CO2: 30 mmol/L (ref 22–32)
Chloride: 104 mmol/L (ref 101–111)
Creatinine, Ser: 1.83 mg/dL — ABNORMAL HIGH (ref 0.44–1.00)
GFR calc Af Amer: 28 mL/min — ABNORMAL LOW (ref >60)
GFR calc non Af Amer: 24 mL/min — ABNORMAL LOW (ref >60)
Glucose, Bld: 173 mg/dL — ABNORMAL HIGH (ref 65–99)
POTASSIUM: 3.4 mmol/L — AB (ref 3.5–5.1)
SODIUM: 138 mmol/L (ref 135–145)

## 2015-08-04 LAB — CBC
HCT: 25.8 % — ABNORMAL LOW (ref 35.0–47.0)
Hemoglobin: 8.4 g/dL — ABNORMAL LOW (ref 12.0–16.0)
MCH: 30.3 pg (ref 26.0–34.0)
MCHC: 32.7 g/dL (ref 32.0–36.0)
MCV: 92.7 fL (ref 80.0–100.0)
PLATELETS: 300 10*3/uL (ref 150–440)
RBC: 2.78 MIL/uL — AB (ref 3.80–5.20)
RDW: 15.2 % — ABNORMAL HIGH (ref 11.5–14.5)
WBC: 15.5 10*3/uL — ABNORMAL HIGH (ref 3.6–11.0)

## 2015-08-04 LAB — MAGNESIUM: MAGNESIUM: 2.1 mg/dL (ref 1.7–2.4)

## 2015-08-04 MED ORDER — DIGOXIN 125 MCG PO TABS: 0.1250 mg | ORAL_TABLET | Freq: Every day | ORAL | Status: DC

## 2015-08-04 MED ORDER — DEXAMETHASONE 4 MG PO TABS
2.0000 mg | ORAL_TABLET | Freq: Three times a day (TID) | ORAL | Status: DC
  Administered 2015-08-04 – 2015-08-05 (×5): 2 mg via ORAL
  Filled 2015-08-04 (×7): qty 1

## 2015-08-04 MED ORDER — POTASSIUM CHLORIDE CRYS ER 20 MEQ PO TBCR
40.0000 meq | EXTENDED_RELEASE_TABLET | ORAL | Status: AC
  Administered 2015-08-04 (×2): 40 meq via ORAL
  Filled 2015-08-04 (×2): qty 2

## 2015-08-04 MED ORDER — DIGOXIN 125 MCG PO TABS
0.0625 mg | ORAL_TABLET | Freq: Every day | ORAL | Status: DC
  Administered 2015-08-04 – 2015-08-05 (×2): 0.0625 mg via ORAL
  Filled 2015-08-04 (×2): qty 1

## 2015-08-04 MED ORDER — AMIODARONE HCL 200 MG PO TABS
400.0000 mg | ORAL_TABLET | Freq: Two times a day (BID) | ORAL | Status: DC
  Administered 2015-08-04 – 2015-08-05 (×3): 400 mg via ORAL
  Filled 2015-08-04 (×3): qty 2

## 2015-08-04 MED ORDER — METOPROLOL TARTRATE 25 MG PO TABS
25.0000 mg | ORAL_TABLET | Freq: Two times a day (BID) | ORAL | Status: AC
  Administered 2015-08-04 – 2015-08-05 (×4): 25 mg via ORAL
  Filled 2015-08-04 (×4): qty 1

## 2015-08-04 NOTE — Progress Notes (Signed)
Patient rested quietly tonight with no complaints. A&Ox3, VSS, and ST on tele. Nursing staff will continue to monitor. Earleen Reaper, RN

## 2015-08-04 NOTE — Progress Notes (Signed)
[place] at [facility]                                                                                                                                                                                            Patient Demographics   Claria Giarrusso, is a 82 y.o. female, DOB - Dec 07, 1929, BO:[id number]  Admit date - 07/28/2015   Admitting Physician Katha Cabal, MD  Outpatient Primary MD for the patient is Sarajane Jews, MD   LOS - 7  Subjective:Heart rate still in the 120s, states she feels a little better , swelling in the upper extremity improved    Review of Systems:   CONSTITUTIONAL: No documented fever. No fatigue, weakness. No weight gain, no weight loss.  EYES: No blurry or double vision.  ENT: No tinnitus. No postnasal drip. No redness of the oropharynx.  RESPIRATORY: No cough, no wheeze, no hemoptysis. Positive dyspnea.  CARDIOVASCULAR: No chest pain. No orthopnea. No palpitations. No syncope.  GASTROINTESTINAL: No nausea, no vomiting or diarrhea. No abdominal pain. No melena or hematochezia.  GENITOURINARY: No dysuria or hematuria.  ENDOCRINE: No polyuria or nocturia. No heat or cold intolerance.  HEMATOLOGY: No anemia. No bruising. No bleeding.  INTEGUMENTARY: No rashes. No lesions. Swelling of the upper extremity MUSCULOSKELETAL: No arthritis. No swelling. No gout.  NEUROLOGIC: No numbness, tingling, or ataxia. No seizure-type activity.  PSYCHIATRIC: No anxiety. No insomnia. No ADD.    Vitals:   Filed Vitals:   08/03/15 1936 08/03/15 2351 08/04/15 0500 08/04/15 0526  BP: 132/71 125/98  127/74  Pulse: 122 111  117  Temp: 97.9 F (36.6 C) 98.5 F (36.9 C)  98.7 F (37.1 C)  TempSrc: Oral Oral  Oral  Resp:      Height:      Weight:   97.024 kg (213 lb 14.4 oz)   SpO2: 99% 97%  99%    Wt Readings from Last 3 Encounters:  08/04/15 97.024 kg (213 lb 14.4 oz)  09/20/14 90.719 kg (200 lb)  03/31/13 99.338 kg (219 lb)      Intake/Output Summary (Last 24 hours) at 08/04/15 0845 Last data filed at 08/03/15 1522  Gross per 24 hour  Intake      6 ml  Output      0 ml  Net      6 ml    Physical Exam:   GENERAL: Pleasant-appearing in no apparent distress.  HEAD, EYES, EARS, NOSE AND THROAT: Atraumatic, normocephalic. Extraocular muscles are intact. Pupils equal and reactive to light. Sclerae anicteric. No conjunctival injection. No oro-pharyngeal erythema.  NECK: Supple.  There is no jugular venous distention. No bruits, no lymphadenopathy, no thyromegaly.  HEART: Regular rate and rhythm,. No murmurs, no rubs, no clicks.  LUNGS: Clear to auscultation bilaterally. No rales or rhonchi. No wheezes.  ABDOMEN: Soft, flat, nontender, nondistended. Has good bowel sounds. No hepatosplenomegaly appreciated.  EXTREMITIES: No evidence of any cyanosis, clubbing, 1+ edema of the lower extremity And upper extremity NEUROLOGIC: The patient is alert, awake, and oriented x3 with no focal motor or sensory deficits appreciated bilaterally.  SKIN: Moist and warm with no rashes appreciated.  Psych: Not anxious, depressed LN: No inguinal LN enlargement    Antibiotics   Anti-infectives    Start     Dose/Rate Route Frequency Ordered Stop   07/30/15 0600  piperacillin-tazobactam (ZOSYN) IVPB 3.375 g     3.375 g 12.5 mL/hr over 240 Minutes Intravenous 3 times per day 07/30/15 0403     07/29/15 2230  cefUROXime (ZINACEF) 1.5 g in dextrose 5 % 50 mL IVPB     1.5 g 100 mL/hr over 30 Minutes Intravenous Every 12 hours 07/29/15 2218 07/30/15 1121      Medications   Scheduled Meds: . apixaban  2.5 mg Oral BID  . bacitracin  1 application Topical BID  . dexamethasone  2 mg Oral 3 times per day  . digoxin  0.125 mg Oral Daily  . docusate sodium  100 mg Oral BID  . DULoxetine  60 mg Oral Daily  . feeding supplement (ENSURE ENLIVE)  237 mL Oral BID BM  . furosemide  20 mg Intravenous Q12H  . levothyroxine  75 mcg Oral QAC  breakfast  . metoprolol tartrate  25 mg Oral BID  . piperacillin-tazobactam (ZOSYN)  IV  3.375 g Intravenous 3 times per day  . potassium chloride  40 mEq Oral Q4H  . QUEtiapine  25 mg Oral QHS  . sodium chloride flush  3 mL Intravenous Q12H  . sodium chloride flush  3 mL Intravenous Q12H   Continuous Infusions: . sodium chloride 10 mL/hr at 08/03/15 1631  . amiodarone 30 mg/hr (08/03/15 2010)   PRN Meds:.acetaminophen **OR** acetaminophen, albuterol, fentaNYL, fentaNYL (SUBLIMAZE) injection, HYDROcodone-acetaminophen, magnesium hydroxide, morphine injection, [DISCONTINUED] ondansetron **OR** ondansetron (ZOFRAN) IV, ondansetron (ZOFRAN) IV, polyethylene glycol, sodium chloride flush, sodium chloride flush, sorbitol   Data Review:   Micro Results Recent Results (from the past 240 hour(s))  MRSA PCR Screening     Status: Abnormal   Collection Time: 07/28/15 10:00 PM  Result Value Ref Range Status   MRSA by PCR POSITIVE (A) NEGATIVE Final    Comment:        The GeneXpert MRSA Assay (FDA approved for NASAL specimens only), is one component of a comprehensive MRSA colonization surveillance program. It is not intended to diagnose MRSA infection nor to guide or monitor treatment for MRSA infections. CRITICAL RESULT CALLED TO, READ BACK BY AND VERIFIED WITH: CALLED TO LISA ROMERO @ [id number] ON 07/28/2015 BY CAF     Radiology Reports Dg Lumbar Spine 2-3 Views  07/28/2015  CLINICAL DATA:  Low back and left hip pain.  No time course given. EXAM: LUMBAR SPINE - 2-3 VIEW; DG HIP (WITH OR WITHOUT PELVIS) 2-3V LEFT COMPARISON:  CT scan 09/19/2014 FINDINGS: Lumbar spine: Left convex lumbar scoliosis and degenerative lumbar spondylosis. Stable vertebral augmentation changes at L2. There is also a remote compression fracture of L1. No acute bony findings. Stable advanced facet disease. No obvious pars defects. Stable aortic and iliac artery calcifications. The visualized  bony pelvis is intact.  Pelvis/left hip: Both hips are normally located. Moderate degenerative changes bilaterally. The pubic symphysis and SI joints are intact. No pelvic fractures. Remote intra medullary rod and proximal dynamic hip screw transfixing a remote intertrochanteric fracture. No acute bony findings. The hardware is intact. IMPRESSION: 1. Remote lumbar compression fractures. No acute abnormality. Stable scoliosis and degenerative lumbar spondylosis. 2. Intact left hip hardware.  No acute hip or pelvic fracture. Electronically Signed   By: Marijo Sanes M.D.   On: 07/28/2015 16:25   Ct Head Wo Contrast  08/01/2015  CLINICAL DATA:  Recent mental status changes.  Confusion. EXAM: CT HEAD WITHOUT CONTRAST TECHNIQUE: Contiguous axial images were obtained from the base of the skull through the vertex without intravenous contrast. COMPARISON:  09/12/2014.  09/11/2014. FINDINGS: There is generalized brain atrophy. There chronic small-vessel ischemic changes affecting the deep white matter. No sign of acute infarction. No mass lesion, hemorrhage or extra-axial collection. Chronic ventriculomegaly is felt secondary to central atrophy. This appears unchanged. No calvarial abnormality. Sinuses, middle ears and mastoids are clear. IMPRESSION: No acute or reversible finding. Atrophy and chronic small-vessel ischemic changes of the deep white matter. Chronic ventriculomegaly felt secondary to central atrophy. Electronically Signed   By: Nelson Chimes M.D.   On: 08/01/2015 11:36   US Venous Img Lower Bilateral  07/28/2015  CLINICAL DATA:  Bilateral leg pain EXAM: BILATERAL LOWER EXTREMITY VENOUS DOPPLER ULTRASOUND TECHNIQUE: Gray-scale sonography with graded compression, as well as color Doppler and duplex ultrasound were performed to evaluate the lower extremity deep venous systems from the level of the common femoral vein and including the common femoral, femoral, profunda femoral, popliteal and calf veins including the posterior tibial,  peroneal and gastrocnemius veins when visible. The superficial great saphenous vein was also interrogated. Spectral Doppler was utilized to evaluate flow at rest and with distal augmentation maneuvers in the common femoral, femoral and popliteal veins. COMPARISON:  None. FINDINGS: RIGHT LOWER EXTREMITY Common Femoral Vein: No evidence of thrombus. Normal compressibility, respiratory phasicity and response to augmentation. Saphenofemoral Junction: No evidence of thrombus. Normal compressibility and flow on color Doppler imaging. Profunda Femoral Vein: No evidence of thrombus. Normal compressibility and flow on color Doppler imaging. Femoral Vein: No evidence of thrombus. Normal compressibility, respiratory phasicity and response to augmentation. Popliteal Vein: No evidence of thrombus. Normal compressibility, respiratory phasicity and response to augmentation. Calf Veins: No evidence of thrombus. Normal compressibility and flow on color Doppler imaging. Superficial Great Saphenous Vein: No evidence of thrombus. Normal compressibility and flow on color Doppler imaging. Venous Reflux:  None. Other Findings:  None. LEFT LOWER EXTREMITY Common Femoral Vein: No evidence of thrombus. Normal compressibility, respiratory phasicity and response to augmentation. Saphenofemoral Junction: No evidence of thrombus. Normal compressibility and flow on color Doppler imaging. Profunda Femoral Vein: No evidence of thrombus. Normal compressibility and flow on color Doppler imaging. Femoral Vein: No evidence of thrombus. Normal compressibility, respiratory phasicity and response to augmentation. Popliteal Vein: No evidence of thrombus. Normal compressibility, respiratory phasicity and response to augmentation. Calf Veins: No evidence of thrombus. Normal compressibility and flow on color Doppler imaging. Superficial Great Saphenous Vein: No evidence of thrombus. Normal compressibility and flow on color Doppler imaging. Venous Reflux:   None. Other Findings:  None. IMPRESSION: No evidence of deep venous thrombosis. Electronically Signed   By: Inez Catalina M.D.   On: 07/28/2015 19:11   Dg Chest Port 1 View  08/03/2015  CLINICAL DATA:  82 year old female with a  history of not feeling well EXAM: PORTABLE CHEST 1 VIEW COMPARISON:  07/30/2015, 07/29/2015 FINDINGS: Cardiomediastinal silhouette unchanged with cardiomegaly. Atherosclerotic calcification of the aortic arch. Bibasilar opacities. Retrocardiac region is opacified. Obscuration the left hemidiaphragm. Unchanged position of right IJ approach central venous catheter which appears to terminate in the superior vena cava. Interval extubation. Overlying EKG leads. IMPRESSION: Low lung volumes status postextubation, with basilar opacities, potentially edema, atelectasis, and/ or consolidation. Left pleural effusion not excluded. Unchanged right IJ central venous catheter. Signed, Dulcy Fanny. Earleen Newport, DO [facility] Electronically Signed   By: Corrie Mckusick D.O.   On: 08/03/2015 14:54   Dg Chest Port 1 View  07/30/2015  CLINICAL DATA:  Weakness. EXAM: PORTABLE CHEST 1 VIEW COMPARISON:  07/29/2015. FINDINGS: Endotracheal tube repositioned 2.5 cm above the carina. Right IJ line and stable position. Mediastinum and hilar structures are normal. Stable cardiomegaly . Bibasilar atelectasis and/or infiltrates. Small left pleural effusion. No pneumothorax. IMPRESSION: 1. Interim repositioning of endotracheal tube, its tip is now 2.5 cm above the carina in good anatomic position. Right IJ line stable position. 2. Bibasilar atelectasis and/or infiltrates. Small left pleural effusion. No interim change from prior exam . Electronically Signed   By: [place]   On: 07/30/2015 07:57   Portable Chest Xray  07/29/2015  CLINICAL DATA:  Intubation. EXAM: PORTABLE CHEST 1 VIEW COMPARISON:  09/11/2014 FINDINGS: The endotracheal tube is at the level of the  carina, directed toward the right mainstem. There is a right jugular central line with tip in the low SVC. There is no pneumothorax. There is left base consolidation due to atelectasis or infiltrate. Right lung is grossly clear. Pulmonary vasculature is normal. IMPRESSION: 1. Endotracheal tube is at the level of the carina, directed to the right mainstem bronchus origin. Recommend withdrawing the tube 2-3 cm. 2. Right jugular central line.  No pneumothorax. 3. Left base consolidation. Electronically Signed   By: Andreas Newport M.D.   On: 07/29/2015 23:23   Dg Hip Unilat With Pelvis 2-3 Views Left  07/28/2015  CLINICAL DATA:  Low back and left hip pain.  No time course given. EXAM: LUMBAR SPINE - 2-3 VIEW; DG HIP (WITH OR WITHOUT PELVIS) 2-3V LEFT COMPARISON:  CT scan 09/19/2014 FINDINGS: Lumbar spine: Left convex lumbar scoliosis and degenerative lumbar spondylosis. Stable vertebral augmentation changes at L2. There is also a remote compression fracture of L1. No acute bony findings. Stable advanced facet disease. No obvious pars defects. Stable aortic and iliac artery calcifications. The visualized bony pelvis is intact. Pelvis/left hip: Both hips are normally located. Moderate degenerative changes bilaterally. The pubic symphysis and SI joints are intact. No pelvic fractures. Remote intra medullary rod and proximal dynamic hip screw transfixing a remote intertrochanteric fracture. No acute bony findings. The hardware is intact. IMPRESSION: 1. Remote lumbar compression fractures. No acute abnormality. Stable scoliosis and degenerative lumbar spondylosis. 2. Intact left hip hardware.  No acute hip or pelvic fracture. Electronically Signed   By: Marijo Sanes M.D.   On: 07/28/2015 16:25     CBC  Recent Labs Lab 07/31/15 1158 08/01/15 0740 08/02/15 0500 08/03/15 0510 08/04/15 0422  WBC 13.3* 11.5* 12.8* 16.4* 15.5*  HGB 8.3* 8.2* 8.6* 8.6* 8.4*  HCT 25.3* 24.8* 25.9* 26.6* 25.8*  PLT 222 223 246  303 300  MCV 89.9 91.8 91.2 92.0 92.7  MCH 29.7 30.2 30.1 29.6 30.3  MCHC 33.0 32.9 33.0 32.1 32.7  RDW 14.8* 14.5 14.7* 14.8*  15.2*    Chemistries   Recent Labs Lab 07/29/15 0432 08/01/15 2247 08/02/15 1055 08/03/15 0510 08/04/15 0422  NA 136 138 138 138 138  K 4.0 2.8* 3.7 3.4* 3.4*  CL 101 108 107 106 104  CO2 31 30 25 27 30   GLUCOSE 149* 157* 185* 170* 173*  BUN 29* 32* 33* 37* 40*  CREATININE 1.24* 1.58* 1.60* 1.67* 1.83*  CALCIUM 8.7* 8.0* 8.5* 8.6* 8.4*  MG  --   --  1.8 1.7 2.1   ------------------------------------------------------------------------------------------------------------------ estimated creatinine clearance is 23.9 mL/min (by C-G formula based on Cr of 1.83). ------------------------------------------------------------------------------------------------------------------ No results for input(s): HGBA1C in the last 72 hours. ------------------------------------------------------------------------------------------------------------------ No results for input(s): CHOL, HDL, LDLCALC, TRIG, CHOLHDL, LDLDIRECT in the last 72 hours. ------------------------------------------------------------------------------------------------------------------  Recent Labs  08/02/15 0500  TSH 1.203   ------------------------------------------------------------------------------------------------------------------ No results for input(s): VITAMINB12, FOLATE, FERRITIN, TIBC, IRON, RETICCTPCT in the last 72 hours.  Coagulation profile  Recent Labs Lab 07/28/15 1514  INR 1.16    No results for input(s): DDIMER in the last 72 hours.  Cardiac Enzymes  Recent Labs Lab 07/30/15 0327 08/01/15 1611  TROPONINI 2.44* 0.26*   ------------------------------------------------------------------------------------------------------------------ Invalid input(s): POCBNP    Assessment & Plan  The patient is a 82 year old white female with history of paroxysmal atrial  fibrillation, , hypertension, hypothyroidism, myasthenia gravis, obstructive sleep apnea who was evaluated for left leg pain. 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 Status post thrombolysis and open embolectomy on 07/29/2015. Postop patient developed acute respiratory failure and hypotension. She was followed by the intensivist now now we're asked to see the patient in consultation for the vascular surgery  1. Shortness of breath with acute respiratory failure:  Due to acute on chronic systolic CHF continue IV Lasix Recommended incentive spirometry every hour  2. Acute on chronic systolic CHF start IV Lasix,  Chest x-ray reviewed shows combination of CHF and atelectasis  3. Atrial fibrillation currently on amiodarone drip cardiology following Started on apixaban Add metoprolol and digitoxin heart rate still poorly controlled  4. Ischemic leg status post embolectomy primary care team and vascular is addressing  5. Anemia due to acute blood loss as a result of surgery monitor hemoglobin  6. Elevated troponin due to demand ischemia  7. Hypothyroidism continue Synthroid as taking at home stable  8. Elevated WBC count in setting of patient being on IV stress dose steroids currently afebrile I will DC IV Decadron change to oral slowly wean follow WBC count  9. Miscellaneous for DVT prophylaxis on Eliquis      Code Status Orders        Start     Ordered   08/03/15 1032  Do not attempt resuscitation (DNR)   Continuous    Question Answer Comment  In the event of cardiac or respiratory ARREST Do not call a "code blue"   In the event of cardiac or respiratory ARREST Do not perform Intubation, CPR, defibrillation or ACLS   In the event of cardiac or respiratory ARREST Use medication by any route, position, wound care, and other measures to relive pain and suffering. May use oxygen, suction and manual treatment of airway obstruction as needed for comfort.       08/03/15 1031    Code Status History    Date Active Date Inactive Code Status Order ID Comments User Context   07/28/2015  5:52 PM 08/03/2015 10:31 AM Full Code [id number]  Katha Cabal, MD ED  09/12/2014  2:52 AM 09/20/2014  7:49 PM DNR [id number]  Juluis Mire, MD Inpatient             DVT Prophylaxis  eliquis  Lab Results  Component Value Date   PLT 300 08/04/2015     Time Spent in minutes   25min  Greater than 50% of time spent in care coordination and counseling patient regarding the condition and plan of care.   Dustin Flock M.D on 08/04/2015 at 8:45 AM  Between 7am to 6pm - Pager - [phone]  After 6pm go to [email or web address] - password [email or web address] [facility]   Office  [phone]

## 2015-08-04 NOTE — Progress Notes (Signed)
[facility]  Daily Progress Note   Subjective: 6 Days Post-Op: Introduction catheter into aorta right common femoral artery approach, Introduction catheter into aorta left common femoral artery approach and Initiation of arterial thrombolysis with take back for second look angiography with aortogram and bilateral iliofemoral angiograms, catheter placement into aorta from bilateral femoral approaches, mechanical rheolytic thrombectomy of the aorta, left iliac artery, and left common femoral artery and bilateral iliac artery stent placements to reconstruct the distal aorta and proximal iliac arteries with take back later that evening for thromboembolectomy left external iliac artery, common femoral artery, profunda femoris artery and superficial femoral artery.  Family member at bedside. Patient denies bilateral groin or lower extremity pain. Patient still confused - baseline? Tachypnea improved today. VAC intact to suction. Bilateral lower extremities warm with palpable DP pulses.   Objective: Filed Vitals:   08/03/15 1936 08/03/15 2351 08/04/15 0500 08/04/15 0526  BP: 132/71 125/98  127/74  Pulse: 122 111  117  Temp: 97.9 F (36.6 C) 98.5 F (36.9 C)  98.7 F (37.1 C)  TempSrc: Oral Oral  Oral  Resp:      Height:      Weight:   97.024 kg (213 lb 14.4 oz)   SpO2: 99% 97%  99%    Intake/Output Summary (Last 24 hours) at 08/04/15 1337 Last data filed at 08/03/15 1522  Gross per 24 hour  Intake      0 ml  Output      0 ml  Net      0 ml    Physical Exam: Slightly confused, NAD Neck: Right IJ intact - no signs of infection CV: Tacycardic Pulmonary: CTA Bilaterally Abdomen: Soft, Nontender, Nondistended Vascular: Right Groin: healing well, skin tear located on lower abdomen. Left Groin: VAC intact, seal intact, to suction. Bilateral lower extremities: warm, non-tender, DP pulses palpable   Laboratory: CBC    Component  Value Date/Time   WBC 15.5* 08/04/2015 0422   WBC 9.8 05/17/2014 1834   HGB 8.4* 08/04/2015 0422   HGB 11.6* 05/17/2014 1834   HCT 25.8* 08/04/2015 0422   HCT 36.6 05/17/2014 1834   PLT 300 08/04/2015 0422   PLT 242 05/17/2014 1834   BMET    Component Value Date/Time   NA 138 08/04/2015 0422   NA 137 05/17/2014 1834   K 3.4* 08/04/2015 0422   K 4.4 05/17/2014 1834   CL 104 08/04/2015 0422   CL 100 05/17/2014 1834   CO2 30 08/04/2015 0422   CO2 30 05/17/2014 1834   GLUCOSE 173* 08/04/2015 0422   GLUCOSE 99 05/17/2014 1834   BUN 40* 08/04/2015 0422   BUN 15 05/17/2014 1834   CREATININE 1.83* 08/04/2015 0422   CREATININE 1.13 05/17/2014 1834   CALCIUM 8.4* 08/04/2015 0422   CALCIUM 8.7 05/17/2014 1834   GFRNONAA 24* 08/04/2015 0422   GFRNONAA 49* 05/17/2014 1834   GFRNONAA 35* 11/29/2012 1335   GFRNONAA 35* 11/29/2012 1335   GFRAA 28* 08/04/2015 0422   GFRAA 59* 05/17/2014 1834   GFRAA 41* 11/29/2012 1335   GFRAA 40* 11/29/2012 1335   Assessment/Planning: 82 year old female with PMHx of a-fib who presented to ED with Right Lower Extremity near occlusion of the common iliac on the right. Internal and external iliac arteries as well as the common femoral profunda femoris SFA and popliteal widely patent. Trifurcation is patent. Left Lower Extremity occlusion of the entire common iliac on the left. Internal and external iliac arteries  as well as the common femoral profunda femoris SFA and popliteal widely patent. Patient is s/p multiple intervnetions including thrombolysis, bilateral stent placement with take back for thromboembolectomy - stable from vascular standpoint 1) Next VAC change Monday by nursing. 2) Medical care as per internal medicine service. 3) Physical therapy.  4) Most likely discharge to SNF  [facility] PA-C 08/04/2015 1:37 PM

## 2015-08-04 NOTE — Progress Notes (Signed)
Pharmacy Antibiotic Note  [name] is a 82 y.o. female admitted on 07/28/2015 with wound infection.  Pharmacy has been consulted for Zosyn dosing.  Plan: Continue Zosyn 3.375 grams q 8 h EI.   Height: 5' (152.4 cm) Weight: 213 lb 14.4 oz (97.024 kg) IBW/kg (Calculated) : 45.5  Temp (24hrs), Avg:98.1 F (36.7 C), Min:97.5 F (36.4 C), Max:98.7 F (37.1 C)   Recent Labs Lab 07/29/15 0432  07/30/15 0327 07/31/15 1158 08/01/15 0740 08/01/15 2247 08/02/15 0500 08/02/15 1055 08/03/15 0510 08/04/15 0422  WBC 8.9  < >  --  13.3* 11.5*  --  12.8*  --  16.4* 15.5*  CREATININE 1.24*  --   --   --   --  1.58*  --  1.60* 1.67* 1.83*  LATICACIDVEN  --   --  1.7  --   --   --   --   --   --   --   < > = values in this interval not displayed.  Estimated Creatinine Clearance: 23.9 mL/min (by C-G formula based on Cr of 1.83).    Allergies  Allergen Reactions  . Betadine [Povidone Iodine] Hives  . Sulfa Antibiotics   . Valium [Diazepam] Anxiety    Antimicrobials this admission: Cefuroxime  4/4 >> 4/4 Zosyn 4/4  >>   Dose adjustments this admission:   Microbiology results: 4/2 MRSA PCR: (+)  Thank you for allowing pharmacy to be a part of this patient's care.  Chloeann Alfred A 08/04/2015 10:59 AM

## 2015-08-04 NOTE — Progress Notes (Signed)
Patient: [name] / Admit Date: 07/28/2015 / Date of Encounter: 08/04/2015, 8:48 AM   Subjective: Remain SOB and orthopneic. Received IV Lasix on 4/8 given SOB. Renal function bumped from 1.67-->1.83, BUN from 37-->40. CXR 4/8 showed CHF with atelectasis. WBC was up-trending from 12.8-->16.4-->15.5. On Zosyn as of 4/6. Remains tachycardic with HR in the 1-teens to 120s.   Review of Systems: Review of Systems  Constitutional: Positive for malaise/fatigue. Negative for fever, chills and diaphoresis.  HENT: Positive for hearing loss. Negative for congestion.        Deaf in right ear  Eyes: Negative for discharge and redness.  Respiratory: Positive for cough, shortness of breath and wheezing. Negative for hemoptysis and sputum production.   Cardiovascular: Positive for orthopnea. Negative for chest pain, palpitations, claudication, leg swelling and PND.  Gastrointestinal: Negative for nausea, vomiting and abdominal pain.  Musculoskeletal: Negative for falls.  Skin: Negative for rash.  Neurological: Positive for weakness. Negative for dizziness, sensory change, speech change and loss of consciousness.  Endo/Heme/Allergies: Does not bruise/bleed easily.  Psychiatric/Behavioral: The patient is not nervous/anxious.     Objective: Telemetry: sinus tachycardia, 1-teens to 120's Physical Exam: Blood pressure 127/74, pulse 117, temperature 98.7 F (37.1 C), temperature source Oral, resp. rate 20, height 5' (1.524 m), weight 213 lb 14.4 oz (97.024 kg), SpO2 99 %. Body mass index is 41.77 kg/(m^2). General: Well developed, well nourished, in no acute distress. Head: Normocephalic, atraumatic, sclera non-icteric, no xanthomas, nares are without discharge. Neck: Negative for carotid bruits. JVP unable to be assessed given right IJ with dressing in place. Lungs: Decreased breath sounds bilaterally given body habitus. Breathing is unlabored. On 3 L via nasal cannula.  Heart: Tachycardic, S1 S2  without murmurs, rubs, or gallops.  Abdomen: Obese, soft, non-tender, non-distended with normoactive bowel sounds. No rebound/guarding. Extremities: No clubbing or cyanosis. No edema. Distal pedal pulses are 1+ and equal bilaterally. Neuro: Alert and oriented X 3. Moves all extremities spontaneously. Psych:  Responds to questions appropriately with a normal affect.   Intake/Output Summary (Last 24 hours) at 08/04/15 0848 Last data filed at 08/03/15 1522  Gross per 24 hour  Intake      6 ml  Output      0 ml  Net      6 ml    Inpatient Medications:  . apixaban  2.5 mg Oral BID  . bacitracin  1 application Topical BID  . dexamethasone  2 mg Oral 3 times per day  . digoxin  0.125 mg Oral Daily  . docusate sodium  100 mg Oral BID  . DULoxetine  60 mg Oral Daily  . feeding supplement (ENSURE ENLIVE)  237 mL Oral BID BM  . furosemide  20 mg Intravenous Q12H  . levothyroxine  75 mcg Oral QAC breakfast  . metoprolol tartrate  25 mg Oral BID  . piperacillin-tazobactam (ZOSYN)  IV  3.375 g Intravenous 3 times per day  . potassium chloride  40 mEq Oral Q4H  . QUEtiapine  25 mg Oral QHS  . sodium chloride flush  3 mL Intravenous Q12H  . sodium chloride flush  3 mL Intravenous Q12H   Infusions:  . sodium chloride 10 mL/hr at 08/03/15 1631  . amiodarone 30 mg/hr (08/03/15 2010)    Labs:  Recent Labs  08/02/15 1055 08/03/15 0510 08/04/15 0422  NA 138 138 138  K 3.7 3.4* 3.4*  CL 107 106 104  CO2 25 27 30   GLUCOSE  185* 170* 173*  BUN 33* 37* 40*  CREATININE 1.60* 1.67* 1.83*  CALCIUM 8.5* 8.6* 8.4*  MG 1.8 1.7 2.1  PHOS 2.6  --   --    No results for input(s): AST, ALT, ALKPHOS, BILITOT, PROT, ALBUMIN in the last 72 hours.  Recent Labs  08/03/15 0510 08/04/15 0422  WBC 16.4* 15.5*  HGB 8.6* 8.4*  HCT 26.6* 25.8*  MCV 92.0 92.7  PLT 303 300    Recent Labs  08/01/15 1611  TROPONINI 0.26*   Invalid input(s): POCBNP No results for input(s): HGBA1C in the last 72  hours.   Weights: Filed Weights   08/01/15 0606 08/03/15 0455 08/04/15 0500  Weight: 208 lb 8.9 oz (94.6 kg) 223 lb 5.2 oz (101.3 kg) 213 lb 14.4 oz (97.024 kg)     Radiology/Studies:  Dg Lumbar Spine 2-3 Views  07/28/2015  CLINICAL DATA:  Low back and left hip pain.  No time course given. EXAM: LUMBAR SPINE - 2-3 VIEW; DG HIP (WITH OR WITHOUT PELVIS) 2-3V LEFT COMPARISON:  CT scan 09/19/2014 FINDINGS: Lumbar spine: Left convex lumbar scoliosis and degenerative lumbar spondylosis. Stable vertebral augmentation changes at L2. There is also a remote compression fracture of L1. No acute bony findings. Stable advanced facet disease. No obvious pars defects. Stable aortic and iliac artery calcifications. The visualized bony pelvis is intact. Pelvis/left hip: Both hips are normally located. Moderate degenerative changes bilaterally. The pubic symphysis and SI joints are intact. No pelvic fractures. Remote intra medullary rod and proximal dynamic hip screw transfixing a remote intertrochanteric fracture. No acute bony findings. The hardware is intact. IMPRESSION: 1. Remote lumbar compression fractures. No acute abnormality. Stable scoliosis and degenerative lumbar spondylosis. 2. Intact left hip hardware.  No acute hip or pelvic fracture. Electronically Signed   By: Marijo Sanes M.D.   On: 07/28/2015 16:25   Ct Head Wo Contrast  08/01/2015  CLINICAL DATA:  Recent mental status changes.  Confusion. EXAM: CT HEAD WITHOUT CONTRAST TECHNIQUE: Contiguous axial images were obtained from the base of the skull through the vertex without intravenous contrast. COMPARISON:  09/12/2014.  09/11/2014. FINDINGS: There is generalized brain atrophy. There chronic small-vessel ischemic changes affecting the deep white matter. No sign of acute infarction. No mass lesion, hemorrhage or extra-axial collection. Chronic ventriculomegaly is felt secondary to central atrophy. This appears unchanged. No calvarial abnormality. Sinuses,  middle ears and mastoids are clear. IMPRESSION: No acute or reversible finding. Atrophy and chronic small-vessel ischemic changes of the deep white matter. Chronic ventriculomegaly felt secondary to central atrophy. Electronically Signed   By: Nelson Chimes M.D.   On: 08/01/2015 11:36   US Venous Img Lower Bilateral  07/28/2015  CLINICAL DATA:  Bilateral leg pain EXAM: BILATERAL LOWER EXTREMITY VENOUS DOPPLER ULTRASOUND TECHNIQUE: Gray-scale sonography with graded compression, as well as color Doppler and duplex ultrasound were performed to evaluate the lower extremity deep venous systems from the level of the common femoral vein and including the common femoral, femoral, profunda femoral, popliteal and calf veins including the posterior tibial, peroneal and gastrocnemius veins when visible. The superficial great saphenous vein was also interrogated. Spectral Doppler was utilized to evaluate flow at rest and with distal augmentation maneuvers in the common femoral, femoral and popliteal veins. COMPARISON:  None. FINDINGS: RIGHT LOWER EXTREMITY Common Femoral Vein: No evidence of thrombus. Normal compressibility, respiratory phasicity and response to augmentation. Saphenofemoral Junction: No evidence of thrombus. Normal compressibility and flow on color Doppler imaging. Profunda Femoral Vein: No  evidence of thrombus. Normal compressibility and flow on color Doppler imaging. Femoral Vein: No evidence of thrombus. Normal compressibility, respiratory phasicity and response to augmentation. Popliteal Vein: No evidence of thrombus. Normal compressibility, respiratory phasicity and response to augmentation. Calf Veins: No evidence of thrombus. Normal compressibility and flow on color Doppler imaging. Superficial Great Saphenous Vein: No evidence of thrombus. Normal compressibility and flow on color Doppler imaging. Venous Reflux:  None. Other Findings:  None. LEFT LOWER EXTREMITY Common Femoral Vein: No evidence of  thrombus. Normal compressibility, respiratory phasicity and response to augmentation. Saphenofemoral Junction: No evidence of thrombus. Normal compressibility and flow on color Doppler imaging. Profunda Femoral Vein: No evidence of thrombus. Normal compressibility and flow on color Doppler imaging. Femoral Vein: No evidence of thrombus. Normal compressibility, respiratory phasicity and response to augmentation. Popliteal Vein: No evidence of thrombus. Normal compressibility, respiratory phasicity and response to augmentation. Calf Veins: No evidence of thrombus. Normal compressibility and flow on color Doppler imaging. Superficial Great Saphenous Vein: No evidence of thrombus. Normal compressibility and flow on color Doppler imaging. Venous Reflux:  None. Other Findings:  None. IMPRESSION: No evidence of deep venous thrombosis. Electronically Signed   By: Inez Catalina M.D.   On: 07/28/2015 19:11   Dg Chest Port 1 View  08/03/2015  CLINICAL DATA:  82 year old female with a history of not feeling well EXAM: PORTABLE CHEST 1 VIEW COMPARISON:  07/30/2015, 07/29/2015 FINDINGS: Cardiomediastinal silhouette unchanged with cardiomegaly. Atherosclerotic calcification of the aortic arch. Bibasilar opacities. Retrocardiac region is opacified. Obscuration the left hemidiaphragm. Unchanged position of right IJ approach central venous catheter which appears to terminate in the superior vena cava. Interval extubation. Overlying EKG leads. IMPRESSION: Low lung volumes status postextubation, with basilar opacities, potentially edema, atelectasis, and/ or consolidation. Left pleural effusion not excluded. Unchanged right IJ central venous catheter. Signed, Dulcy Fanny. Earleen Newport, DO [facility] Electronically Signed   By: Corrie Mckusick D.O.   On: 08/03/2015 14:54   Dg Chest Port 1 View  07/30/2015  CLINICAL DATA:  Weakness. EXAM: PORTABLE CHEST 1 VIEW COMPARISON:  07/29/2015.  FINDINGS: Endotracheal tube repositioned 2.5 cm above the carina. Right IJ line and stable position. Mediastinum and hilar structures are normal. Stable cardiomegaly . Bibasilar atelectasis and/or infiltrates. Small left pleural effusion. No pneumothorax. IMPRESSION: 1. Interim repositioning of endotracheal tube, its tip is now 2.5 cm above the carina in good anatomic position. Right IJ line stable position. 2. Bibasilar atelectasis and/or infiltrates. Small left pleural effusion. No interim change from prior exam . Electronically Signed   By: Jasper   On: 07/30/2015 07:57   Portable Chest Xray  07/29/2015  CLINICAL DATA:  Intubation. EXAM: PORTABLE CHEST 1 VIEW COMPARISON:  09/11/2014 FINDINGS: The endotracheal tube is at the level of the carina, directed toward the right mainstem. There is a right jugular central line with tip in the low SVC. There is no pneumothorax. There is left base consolidation due to atelectasis or infiltrate. Right lung is grossly clear. Pulmonary vasculature is normal. IMPRESSION: 1. Endotracheal tube is at the level of the carina, directed to the right mainstem bronchus origin. Recommend withdrawing the tube 2-3 cm. 2. Right jugular central line.  No pneumothorax. 3. Left base consolidation. Electronically Signed   By: Andreas Newport M.D.   On: 07/29/2015 23:23   Dg Hip Unilat With Pelvis 2-3 Views Left  07/28/2015  CLINICAL DATA:  Low back and left hip pain.  No time course given.  EXAM: LUMBAR SPINE - 2-3 VIEW; DG HIP (WITH OR WITHOUT PELVIS) 2-3V LEFT COMPARISON:  CT scan 09/19/2014 FINDINGS: Lumbar spine: Left convex lumbar scoliosis and degenerative lumbar spondylosis. Stable vertebral augmentation changes at L2. There is also a remote compression fracture of L1. No acute bony findings. Stable advanced facet disease. No obvious pars defects. Stable aortic and iliac artery calcifications. The visualized bony pelvis is intact. Pelvis/left hip: Both hips are normally  located. Moderate degenerative changes bilaterally. The pubic symphysis and SI joints are intact. No pelvic fractures. Remote intra medullary rod and proximal dynamic hip screw transfixing a remote intertrochanteric fracture. No acute bony findings. The hardware is intact. IMPRESSION: 1. Remote lumbar compression fractures. No acute abnormality. Stable scoliosis and degenerative lumbar spondylosis. 2. Intact left hip hardware.  No acute hip or pelvic fracture. Electronically Signed   By: Marijo Sanes M.D.   On: 07/28/2015 16:25     Assessment and Plan   1. Paroxysmal Atrial Fibrillation - Atrial fibrillation initially noted in our records in 08/2014. The patient and her family declined further cardiac evaluation at that time and the need for anticoagulation - This patients CHA2DS2-VASc Score and unadjusted Ischemic Stroke Rate (% per year) is equal to 11.2 % stroke rate/year from a score of 7 (CHF, HTN, Female, Age (2), TE (2)). Currently on Heparin. Talked with the patient about long-term anticoagulation this AM and she understands the reasoning for starting this. Currently on Eliquis - Appears to be in sinus rhythm, ECG from 4/8 poor quality, Yahmir Sokolov repeat today - Continued difficult rate control, Lopressor and digoxin were added by IM. Unlikely to need digoxin in sinus rhythm  2. Chronic Systolic CHF - Echo this admission shows of EF of 35-40% with hypokinesis of the anterior and apical myocardium. EF was noted to be decreased in the past as well. - The patient and her family have declined further cardiac workup in the past. She is unsure of further workup at this time. Is in agreement for long-term anticoagulation. Orelia Brandstetter discuss further cardiac workup when family is present and she is more alert.  3. Hypotension - BP has been 94/38 - 125/92 in the past 24 hours. - BP improved currently  4. Ischemic Leg - s/p open embolectomy of left iliofemoral system - per Vascular Surgery  5. Anemia - Hgb  13.4 pre-op but reached a trough of 8.2 on 08/01/2015. Trending up to 8.6 today. - continue to monitor  6. Elevated Troponin - elevated to 2.44 on admission. Trending down to 0.26 on 08/01/2015. - denies any recent anginal symptoms. Likely secondary to demand ischemic in the setting of ischemic leg and known CHF. - the patient and her family have declined further cardiac workup in the past.  7. Leukocytosis: - Status post steroids  - On Zosyn  - Per IM  Signed, Christell Faith, PA-C Pager: [phone] 08/04/2015, 8:48 AM  I have seen and examined this patient with Christell Faith.  Agree with above, note added to reflect my findings.  On exam, regular rhythm, no murmurs, lungs clear.  Appears on telemetry to be in sinus tachycardia.  On IV amiodarone.  Render Marley switch her to oral today as she is now in sinus rhythm.  Continues to have APCs.  Would continue Eliquis for CHADS2VASc of 7.  Discussed her AF and heart failure with family and they do not wish further evaluation with catheterization.  The patient is feeling poorly and apparently may wish to go with palliative in the  future if her contrition does not improve.  She was put on IV lasix recently for HF and fluid overload.  She is incontinent; have asked nursing to place foley.  Sahirah Rudell M. Chamika Cunanan MD 08/04/2015 9:41 AM

## 2015-08-04 NOTE — Progress Notes (Signed)
MEDICATION RELATED CONSULT NOTE - INITIAL   Pharmacy Consult for Electrolyte Management Indication:   Allergies  Allergen Reactions  . Betadine [Povidone Iodine] Hives  . Sulfa Antibiotics   . Valium [Diazepam] Anxiety    Patient Measurements: Height: 5' (152.4 cm) Weight: 213 lb 14.4 oz (97.024 kg) IBW/kg (Calculated) : 45.5 Adjusted Body Weight:   Vital Signs: Temp: 98.7 F (37.1 C) (04/09 0526) Temp Source: Oral (04/09 0526) BP: 127/74 mmHg (04/09 0526) Pulse Rate: 117 (04/09 0526) Intake/Output from previous day: 04/08 0701 - 04/09 0700 In: 106 [I.V.:6; IV Piggyback:100] Out: 0  Intake/Output from this shift:    Labs:  Recent Labs  08/02/15 0500 08/02/15 1055 08/03/15 0510 08/04/15 0422  WBC 12.8*  --  16.4* 15.5*  HGB 8.6*  --  8.6* 8.4*  HCT 25.9*  --  26.6* 25.8*  PLT 246  --  303 300  CREATININE  --  1.60* 1.67* 1.83*  MG  --  1.8 1.7 2.1  PHOS  --  2.6  --   --     Lab Results  Component Value Date   K 3.4* 08/04/2015   .last Estimated Creatinine Clearance: 23.9 mL/min (by C-G formula based on Cr of 1.83).   Medical History: Past Medical History  Diagnosis Date  . Hypertension   . Thyroid disorder   . Lymphoma ([place])   . Hypothyroidism   . Arthritis   . Diverticulosis   . Obstructive sleep apnea   . Myasthenia gravis ([place])   . Hip fracture, left ([place])   . Fracture of right humerus   . Asthma   . PAF (paroxysmal atrial fibrillation) ([place])     a. diagnosed in 2016. Refused anticoagulation  . Chronic systolic CHF (congestive heart failure) ([place])     a. 07/2015: EF 35-40% with hypokinesis of the anterior and apical myocardium  . Ischemic leg     a. 07/2015: s/p thrombolysis and open embolectomy of left iliofemoral system    Medications:  Scheduled:  . apixaban  2.5 mg Oral BID  . bacitracin  1 application Topical BID  . docusate sodium  100 mg Oral BID  . DULoxetine  60 mg Oral Daily  . feeding supplement (ENSURE ENLIVE)  237 mL  Oral BID BM  . furosemide  20 mg Intravenous Q12H  . hydrocortisone sodium succinate  50 mg Intravenous Q6H  . levothyroxine  75 mcg Oral QAC breakfast  . piperacillin-tazobactam (ZOSYN)  IV  3.375 g Intravenous 3 times per day  . potassium chloride  40 mEq Oral Q4H  . QUEtiapine  25 mg Oral QHS  . sodium chloride flush  3 mL Intravenous Q12H  . sodium chloride flush  3 mL Intravenous Q12H    Assessment: 82 yo female admitted with hypokalemia, s/sp thrombolysis/open embolectomy 07/29/15. CHF, Afib.  K=3.4 Mag= 2.1  Plan:  Will give KCL po 40 meq q4h x 2 doses.(Patient on Furosemide 20mg  IV q12h). Also- note Scr increasing.  Bobbi Yount A 08/04/2015,8:06 AM

## 2015-08-05 DIAGNOSIS — R410 Disorientation, unspecified: Secondary | ICD-10-CM

## 2015-08-05 DIAGNOSIS — I743 Embolism and thrombosis of arteries of the lower extremities: Secondary | ICD-10-CM

## 2015-08-05 DIAGNOSIS — Z7901 Long term (current) use of anticoagulants: Secondary | ICD-10-CM

## 2015-08-05 DIAGNOSIS — D62 Acute posthemorrhagic anemia: Secondary | ICD-10-CM

## 2015-08-05 DIAGNOSIS — I10 Essential (primary) hypertension: Secondary | ICD-10-CM

## 2015-08-05 DIAGNOSIS — Z515 Encounter for palliative care: Secondary | ICD-10-CM

## 2015-08-05 DIAGNOSIS — I5023 Acute on chronic systolic (congestive) heart failure: Secondary | ICD-10-CM

## 2015-08-05 DIAGNOSIS — K5790 Diverticulosis of intestine, part unspecified, without perforation or abscess without bleeding: Secondary | ICD-10-CM

## 2015-08-05 DIAGNOSIS — E039 Hypothyroidism, unspecified: Secondary | ICD-10-CM

## 2015-08-05 DIAGNOSIS — G9341 Metabolic encephalopathy: Secondary | ICD-10-CM

## 2015-08-05 LAB — CBC
HCT: 26.4 % — ABNORMAL LOW (ref 35.0–47.0)
Hemoglobin: 8.9 g/dL — ABNORMAL LOW (ref 12.0–16.0)
MCH: 32.3 pg (ref 26.0–34.0)
MCHC: 33.7 g/dL (ref 32.0–36.0)
MCV: 95.9 fL (ref 80.0–100.0)
PLATELETS: 301 10*3/uL (ref 150–440)
RBC: 2.75 MIL/uL — ABNORMAL LOW (ref 3.80–5.20)
RDW: 15.7 % — ABNORMAL HIGH (ref 11.5–14.5)
WBC: 13.9 10*3/uL — ABNORMAL HIGH (ref 3.6–11.0)

## 2015-08-05 LAB — BASIC METABOLIC PANEL
ANION GAP: 0 — AB (ref 5–15)
BUN: 40 mg/dL — AB (ref 6–20)
CALCIUM: 8.5 mg/dL — AB (ref 8.9–10.3)
CO2: 33 mmol/L — ABNORMAL HIGH (ref 22–32)
Chloride: 106 mmol/L (ref 101–111)
Creatinine, Ser: 1.81 mg/dL — ABNORMAL HIGH (ref 0.44–1.00)
GFR calc Af Amer: 28 mL/min — ABNORMAL LOW (ref >60)
GFR, EST NON AFRICAN AMERICAN: 25 mL/min — AB (ref >60)
GLUCOSE: 151 mg/dL — AB (ref 65–99)
POTASSIUM: 4.4 mmol/L (ref 3.5–5.1)
SODIUM: 139 mmol/L (ref 135–145)

## 2015-08-05 LAB — MAGNESIUM: MAGNESIUM: 2 mg/dL (ref 1.7–2.4)

## 2015-08-05 MED ORDER — PROCHLORPERAZINE 25 MG RE SUPP
25.0000 mg | Freq: Three times a day (TID) | RECTAL | Status: DC | PRN
  Filled 2015-08-05: qty 1

## 2015-08-05 MED ORDER — MORPHINE SULFATE (CONCENTRATE) 10 MG/0.5ML PO SOLN: 5.0000 mg | ORAL | Status: DC | PRN

## 2015-08-05 MED ORDER — MORPHINE SULFATE (CONCENTRATE) 10 MG/0.5ML PO SOLN
5.0000 mg | ORAL | Status: DC | PRN
Start: 2015-08-05 — End: 2015-08-07

## 2015-08-05 MED ORDER — MORPHINE SULFATE (CONCENTRATE) 10 MG/0.5ML PO SOLN
ORAL | Status: AC
  Administered 2015-08-05: 22:00:00
  Filled 2015-08-05: qty 0.5

## 2015-08-05 MED ORDER — MORPHINE SULFATE (CONCENTRATE) 10 MG/0.5ML PO SOLN
5.0000 mg | Freq: Three times a day (TID) | ORAL | Status: DC
  Administered 2015-08-06: 5 mg via ORAL

## 2015-08-05 MED ORDER — BISACODYL 10 MG RE SUPP: 10.0000 mg | Freq: Every day | RECTAL | Status: DC | PRN

## 2015-08-05 MED ORDER — GLYCOPYRROLATE 0.2 MG/ML IJ SOLN
0.4000 mg | Freq: Three times a day (TID) | INTRAMUSCULAR | Status: DC | PRN
  Filled 2015-08-05: qty 2

## 2015-08-05 NOTE — Care Management Note (Signed)
Case Management Note  Patient Details  Name: NICHELLE RENWICK MRN: [id number] Date of Birth: [birth year]  Subjective/Objective:    Spoke with Dr. Posey Pronto and Dr. Lucky Cowboy, patient is medically stable for discharge from both their standpoints. Dr. Lucky Cowboy is the discharging MD.  Met with daughter and son in law at bedside. Daughter is unsure what the best discharge disposition  Hospice at ALF  verses palliaive care or hospice home. Daughter's first choice is hospice home. Palliative to speak with daughter regarding goals of care.    Action/Plan:   Expected Discharge Date:                  Expected Discharge Plan:     In-House Referral:     Discharge planning Services     Post Acute Care Choice:    Choice offered to:     DME Arranged:    DME Agency:     HH Arranged:    [place] Agency:     Status of Service:     Medicare Important Message Given:  Yes Date Medicare IM Given:    Medicare IM give by:    Date Additional Medicare IM Given:    Additional Medicare Important Message give by:     If discussed at [place] of Stay Meetings, dates discussed:    Additional Comments:  Jolly Mango, RN 08/05/2015, 3:01 PM

## 2015-08-05 NOTE — Progress Notes (Addendum)
Palliative Care Update   I have confirmed in talking privately with primary care-giver daughter (and son-in-law) and also in talking with patient (with conversation witnessed by nurse for pt) that [name] wants only comfort care at this time.   She is ready to pass on and go be with [facility].  I have explained to family that we have done many highly aggressive interventions and have managed to 'fix' a lot of her conditions that were life-threatening only a few days ago.  Had she made this decision earlier, none of these interventions would have taken place. Family and pt state that she has wanted 'comfort care only' for a very long time, but she is only now talking about it often enough and consistently enough, that family knows that this is truly time to do this.    I have talked with her cardiology team and will update her attending. Pt confirmed she only wants comfort meds. That means she would have Eliquis and Amiodarone DCd.  And this means she may go back into a rapid heart rate soon --and this could be uncomfortable.  But, family feels that pt does not want any meds to 'keep her here longer'. This request is not coming from depression, but rather from pts faith that leads her to want to go 'a natural way' and also because she has grown tired of having a 'medicalized life'. She says she lost a year just being in the hospital and she does not want to have to keep coming back into the hospital 'all the time'. Pt had lost the ability to walk a few months ago and her quality of life at baseline is quite poor. She also has pain in her shoulders and hips and does not want therapy.    This seems to me to be ethically appropriate --to stop meds that have been added and to follow her with comfort care only in place.    The question to answer will be this:  Will stopping these two fairly critical meds cause her to rapidly decline so much that she might qualify for [place] will she continue to be  somewhat stable (as she is right now) --in which case, she would need to be a long term care pt at a SNF [place].  Family and pt do not want rehab /therapy.  She does not drink any Ensure and barely eats but we do need a calorie count done to see if she MIGHT be Hospice Home appropriate.  Family does NOT want pt to go back to '[facility]' and yet they have questions about her personal hospital bed which is located there still.    Will see how pt is doing in the am and will make her comfort care at this time.  Will make the medication adjustments mentioned.  I have updated Dr. Lucky Cowboy (on call for Dr. Delana Meyer).  Will update Dr Posey Pronto and rest of care team in the am. Will ask Social Work for [facility] referral.    Full note to follow.     Colleen Can, MD

## 2015-08-05 NOTE — Consult Note (Signed)
   [facility] CM Inpatient Consult   08/05/2015  [name] 08-15-1929 [id number]   Patient screened for potential [place] Management services. Patient is eligible for [place]. Spoke with inpatient care manager and was advised not to to engage patient at this time related to patient and family are still in the decision making process of patient's discharge. [facility] Care Management services not appropriate at this time. If patient's post hospital needs change please place a [facility] Care Management consult. For questions please contact:   Deanna Boehlke RN, [facility] Liaison  [phone]) Business Mobile [phone]) Toll free office

## 2015-08-05 NOTE — Progress Notes (Signed)
Patient: [name] / Admit Date: 07/28/2015 / Date of Encounter: 08/05/2015, 5:05 PM   Subjective: Respiratory status worsened over the weekend and she required diuresis. She converted to sinus rhythm with amiodarone. She is feeling better.  Review of Systems: ROS  Objective: Telemetry: Sinus rhythm with heart rate between 90 and 100. Physical Exam: Blood pressure 120/65, pulse 80, temperature 97.8 F (36.6 C), temperature source Oral, resp. rate 20, height 5' (1.524 m), weight 214 lb 3.2 oz (97.16 kg), SpO2 100 %. Body mass index is 41.83 kg/(m^2). General: Well developed, well nourished, in no acute distress. Head: Normocephalic, atraumatic, sclera non-icteric, no xanthomas, nares are without discharge. Neck: Negative for carotid bruits. JVP unable to be assessed given right IJ with dressing in place. Lungs: Decreased breath sounds bilaterally given body habitus. Breathing is unlabored. On 3 L via nasal cannula.  Heart: Tachycardic, S1 S2 without murmurs, rubs, or gallops.  Abdomen: Obese, soft, non-tender, non-distended with normoactive bowel sounds. No rebound/guarding. Extremities: No clubbing or cyanosis. No edema. Distal pedal pulses are 1+ and equal bilaterally. Neuro: Alert and oriented X 3. Moves all extremities spontaneously. Psych:  Responds to questions appropriately with a normal affect.   Intake/Output Summary (Last 24 hours) at 08/05/15 1705 Last data filed at 08/05/15 1300  Gross per 24 hour  Intake 735.83 ml  Output    950 ml  Net -214.17 ml    Inpatient Medications:  . amiodarone  400 mg Oral BID  . apixaban  2.5 mg Oral BID  . bacitracin  1 application Topical BID  . dexamethasone  2 mg Oral 3 times per day  . docusate sodium  100 mg Oral BID  . DULoxetine  60 mg Oral Daily  . feeding supplement (ENSURE ENLIVE)  237 mL Oral BID BM  . furosemide  20 mg Intravenous Q12H  . levothyroxine  75 mcg Oral QAC breakfast  . metoprolol tartrate  25 mg Oral  BID  . piperacillin-tazobactam (ZOSYN)  IV  3.375 g Intravenous 3 times per day  . QUEtiapine  25 mg Oral QHS  . sodium chloride flush  3 mL Intravenous Q12H  . sodium chloride flush  3 mL Intravenous Q12H   Infusions:  . sodium chloride 10 mL/hr at 08/03/15 1631    Labs:  Recent Labs  08/04/15 0422 08/05/15 0438  NA 138 139  K 3.4* 4.4  CL 104 106  CO2 30 33*  GLUCOSE 173* 151*  BUN 40* 40*  CREATININE 1.83* 1.81*  CALCIUM 8.4* 8.5*  MG 2.1 2.0   No results for input(s): AST, ALT, ALKPHOS, BILITOT, PROT, ALBUMIN in the last 72 hours.  Recent Labs  08/04/15 0422 08/05/15 0438  WBC 15.5* 13.9*  HGB 8.4* 8.9*  HCT 25.8* 26.4*  MCV 92.7 95.9  PLT 300 301   No results for input(s): CKTOTAL, CKMB, TROPONINI in the last 72 hours. Invalid input(s): POCBNP No results for input(s): HGBA1C in the last 72 hours.   Weights: Filed Weights   08/03/15 0455 08/04/15 0500 08/05/15 0458  Weight: 223 lb 5.2 oz (101.3 kg) 213 lb 14.4 oz (97.024 kg) 214 lb 3.2 oz (97.16 kg)     Radiology/Studies:  Dg Lumbar Spine 2-3 Views  07/28/2015  CLINICAL DATA:  Low back and left hip pain.  No time course given. EXAM: LUMBAR SPINE - 2-3 VIEW; DG HIP (WITH OR WITHOUT PELVIS) 2-3V LEFT COMPARISON:  CT scan 09/19/2014 FINDINGS: Lumbar spine: Left convex lumbar scoliosis  and degenerative lumbar spondylosis. Stable vertebral augmentation changes at L2. There is also a remote compression fracture of L1. No acute bony findings. Stable advanced facet disease. No obvious pars defects. Stable aortic and iliac artery calcifications. The visualized bony pelvis is intact. Pelvis/left hip: Both hips are normally located. Moderate degenerative changes bilaterally. The pubic symphysis and SI joints are intact. No pelvic fractures. Remote intra medullary rod and proximal dynamic hip screw transfixing a remote intertrochanteric fracture. No acute bony findings. The hardware is intact. IMPRESSION: 1. Remote lumbar  compression fractures. No acute abnormality. Stable scoliosis and degenerative lumbar spondylosis. 2. Intact left hip hardware.  No acute hip or pelvic fracture. Electronically Signed   By: Marijo Sanes M.D.   On: 07/28/2015 16:25   Ct Head Wo Contrast  08/01/2015  CLINICAL DATA:  Recent mental status changes.  Confusion. EXAM: CT HEAD WITHOUT CONTRAST TECHNIQUE: Contiguous axial images were obtained from the base of the skull through the vertex without intravenous contrast. COMPARISON:  09/12/2014.  09/11/2014. FINDINGS: There is generalized brain atrophy. There chronic small-vessel ischemic changes affecting the deep white matter. No sign of acute infarction. No mass lesion, hemorrhage or extra-axial collection. Chronic ventriculomegaly is felt secondary to central atrophy. This appears unchanged. No calvarial abnormality. Sinuses, middle ears and mastoids are clear. IMPRESSION: No acute or reversible finding. Atrophy and chronic small-vessel ischemic changes of the deep white matter. Chronic ventriculomegaly felt secondary to central atrophy. Electronically Signed   By: Nelson Chimes M.D.   On: 08/01/2015 11:36   US Venous Img Lower Bilateral  07/28/2015  CLINICAL DATA:  Bilateral leg pain EXAM: BILATERAL LOWER EXTREMITY VENOUS DOPPLER ULTRASOUND TECHNIQUE: Gray-scale sonography with graded compression, as well as color Doppler and duplex ultrasound were performed to evaluate the lower extremity deep venous systems from the level of the common femoral vein and including the common femoral, femoral, profunda femoral, popliteal and calf veins including the posterior tibial, peroneal and gastrocnemius veins when visible. The superficial great saphenous vein was also interrogated. Spectral Doppler was utilized to evaluate flow at rest and with distal augmentation maneuvers in the common femoral, femoral and popliteal veins. COMPARISON:  None. FINDINGS: RIGHT LOWER EXTREMITY Common Femoral Vein: No evidence of  thrombus. Normal compressibility, respiratory phasicity and response to augmentation. Saphenofemoral Junction: No evidence of thrombus. Normal compressibility and flow on color Doppler imaging. Profunda Femoral Vein: No evidence of thrombus. Normal compressibility and flow on color Doppler imaging. Femoral Vein: No evidence of thrombus. Normal compressibility, respiratory phasicity and response to augmentation. Popliteal Vein: No evidence of thrombus. Normal compressibility, respiratory phasicity and response to augmentation. Calf Veins: No evidence of thrombus. Normal compressibility and flow on color Doppler imaging. Superficial Great Saphenous Vein: No evidence of thrombus. Normal compressibility and flow on color Doppler imaging. Venous Reflux:  None. Other Findings:  None. LEFT LOWER EXTREMITY Common Femoral Vein: No evidence of thrombus. Normal compressibility, respiratory phasicity and response to augmentation. Saphenofemoral Junction: No evidence of thrombus. Normal compressibility and flow on color Doppler imaging. Profunda Femoral Vein: No evidence of thrombus. Normal compressibility and flow on color Doppler imaging. Femoral Vein: No evidence of thrombus. Normal compressibility, respiratory phasicity and response to augmentation. Popliteal Vein: No evidence of thrombus. Normal compressibility, respiratory phasicity and response to augmentation. Calf Veins: No evidence of thrombus. Normal compressibility and flow on color Doppler imaging. Superficial Great Saphenous Vein: No evidence of thrombus. Normal compressibility and flow on color Doppler imaging. Venous Reflux:  None. Other Findings:  None.  IMPRESSION: No evidence of deep venous thrombosis. Electronically Signed   By: Inez Catalina M.D.   On: 07/28/2015 19:11   Dg Chest Port 1 View  08/03/2015  CLINICAL DATA:  82 year old female with a history of not feeling well EXAM: PORTABLE CHEST 1 VIEW COMPARISON:  07/30/2015, 07/29/2015 FINDINGS:  Cardiomediastinal silhouette unchanged with cardiomegaly. Atherosclerotic calcification of the aortic arch. Bibasilar opacities. Retrocardiac region is opacified. Obscuration the left hemidiaphragm. Unchanged position of right IJ approach central venous catheter which appears to terminate in the superior vena cava. Interval extubation. Overlying EKG leads. IMPRESSION: Low lung volumes status postextubation, with basilar opacities, potentially edema, atelectasis, and/ or consolidation. Left pleural effusion not excluded. Unchanged right IJ central venous catheter. Signed, Dulcy Fanny. Earleen Newport, DO [facility] Electronically Signed   By: Corrie Mckusick D.O.   On: 08/03/2015 14:54   Dg Chest Port 1 View  07/30/2015  CLINICAL DATA:  Weakness. EXAM: PORTABLE CHEST 1 VIEW COMPARISON:  07/29/2015. FINDINGS: Endotracheal tube repositioned 2.5 cm above the carina. Right IJ line and stable position. Mediastinum and hilar structures are normal. Stable cardiomegaly . Bibasilar atelectasis and/or infiltrates. Small left pleural effusion. No pneumothorax. IMPRESSION: 1. Interim repositioning of endotracheal tube, its tip is now 2.5 cm above the carina in good anatomic position. Right IJ line stable position. 2. Bibasilar atelectasis and/or infiltrates. Small left pleural effusion. No interim change from prior exam . Electronically Signed   By: Dendron   On: 07/30/2015 07:57   Portable Chest Xray  07/29/2015  CLINICAL DATA:  Intubation. EXAM: PORTABLE CHEST 1 VIEW COMPARISON:  09/11/2014 FINDINGS: The endotracheal tube is at the level of the carina, directed toward the right mainstem. There is a right jugular central line with tip in the low SVC. There is no pneumothorax. There is left base consolidation due to atelectasis or infiltrate. Right lung is grossly clear. Pulmonary vasculature is normal. IMPRESSION: 1. Endotracheal tube is at the level of the carina,  directed to the right mainstem bronchus origin. Recommend withdrawing the tube 2-3 cm. 2. Right jugular central line.  No pneumothorax. 3. Left base consolidation. Electronically Signed   By: Andreas Newport M.D.   On: 07/29/2015 23:23   Dg Hip Unilat With Pelvis 2-3 Views Left  07/28/2015  CLINICAL DATA:  Low back and left hip pain.  No time course given. EXAM: LUMBAR SPINE - 2-3 VIEW; DG HIP (WITH OR WITHOUT PELVIS) 2-3V LEFT COMPARISON:  CT scan 09/19/2014 FINDINGS: Lumbar spine: Left convex lumbar scoliosis and degenerative lumbar spondylosis. Stable vertebral augmentation changes at L2. There is also a remote compression fracture of L1. No acute bony findings. Stable advanced facet disease. No obvious pars defects. Stable aortic and iliac artery calcifications. The visualized bony pelvis is intact. Pelvis/left hip: Both hips are normally located. Moderate degenerative changes bilaterally. The pubic symphysis and SI joints are intact. No pelvic fractures. Remote intra medullary rod and proximal dynamic hip screw transfixing a remote intertrochanteric fracture. No acute bony findings. The hardware is intact. IMPRESSION: 1. Remote lumbar compression fractures. No acute abnormality. Stable scoliosis and degenerative lumbar spondylosis. 2. Intact left hip hardware.  No acute hip or pelvic fracture. Electronically Signed   By: Marijo Sanes M.D.   On: 07/28/2015 16:25     Assessment and Plan   1. Paroxysmal Atrial Fibrillation - Atrial fibrillation initially noted in our records in 08/2014. The patient and her family declined further cardiac evaluation at that time and  the need for anticoagulation - She presented with LE arterial embolization due to A-fib. Continue life long anticoagulation with Eliquis.  - I stopped Digoxin.  - Change Amiodarone to 200 mg bid before discharge.   2. Chronic Systolic CHF - Echo this admission shows of EF of 35-40% with hypokinesis of the anterior and apical myocardium.  EF was noted to be decreased in the past as well. - The patient and her family have declined further cardiac workup  As per my discussion with them Friday. They are considering hospice.   3. Hypotension - BP has been 94/38 - 125/92 in the past 24 hours. - BP improved currently  4. Ischemic Leg - resolved.   5. Anemia - Hgb 13.4 pre-op but reached a trough of 8.2 on 08/01/2015. Trending up . - continue to monitor    Signed, Kathlyn Sacramento, MD   08/05/2015, 5:05 PM

## 2015-08-05 NOTE — Progress Notes (Signed)
Clinical Social Worker (CSW) contacted patient's daughter Mickeal Skinner to determine disposition. Per daughter their first choice is the Hospice facility on [address] in [place]. Daughter also reported that if patient can go to rehab then she would like for patient to go [facility]. [facility] did make a bed offer. CSW and daughter also discussed long term care at a SNF. Per daughter patient can pay privately for long term care at a SNF and prefers [facility] for long term care. CSW discussed case with palliative MD Dr. Wendie Simmer and RN Case Manager. CSW will meet with patient and family tomorrow after palliative meeting.   Blima Rich, LCSW [phone]

## 2015-08-05 NOTE — Progress Notes (Signed)
Physical Therapy Treatment Patient Details Name: [name] MRN: [id number] DOB: 1929-12-14 Today's Date: 08/05/2015    History of Present Illness Pt is a pleasant 82 y.o. F admitted to hospital for ischemia in L leg. Pt received angiogram in B LE and thromboembolectomy in L LE on 4/3. Pt intubated on 4/3 and extubated on 4/4. Pt has hx of HTN, thyroid disorder, myasthenia gravis, L hip fx, R humerus fx, A-fib, and dementia.      PT Comments    Pt slowly progressing towards goals. Pt able to perform bed mobility to sit on EOB using 2+ max assist and use of railings. Pt able to sit at EOB with 2+ assist for approx 2 minutes before requesting to lie back down. Pt expressed it was nice to sit up for a little bit. Pt able to perform supine there-ex on B UE and LE with mod to no assist. Pts HR at 95 bpm during there-ex. HR dropped to 55 bpm after pt rested after performing bed mobility. Pt demonstrates deficits in strength, ROM, balance, and mobility. Pt would benefit from further skilled PT to address deficits; recommend pt sent to SNF following discharge from acute hospitalization.   Follow Up Recommendations  SNF     Equipment Recommendations       Recommendations for Other Services       Precautions / Restrictions Precautions Precautions: Fall Restrictions Weight Bearing Restrictions: No    Mobility  Bed Mobility Overal bed mobility: +2 for physical assistance             General bed mobility comments: Pt able to perform bed mobility to EOB using 2+ max assist and use of railings. Pt required assist w/B LE and trunk. Pt provided heavy cues regarding hand placement during transfer. Pt required 2+ assist to slide towards head of bed, pt able to tuck chin.   Transfers                 General transfer comment: Transfer not performed for safety reasons due to pts strength and mobility.   Ambulation/Gait                 Stairs            Wheelchair  Mobility    Modified Rankin (Stroke Patients Only)       Balance Overall balance assessment: Needs assistance Sitting-balance support: Bilateral upper extremity supported Sitting balance-Leahy Scale: Poor Sitting balance - Comments: Pt able to sit up on EOB with 2+ mod assist and use of B UE on railings. Pt provided assist with keeping trunk upright. Pt able to maintain sitting balance for approx 2 minutes before expressing she needed to lie down.                             Cognition Arousal/Alertness: Awake/alert Behavior During Therapy: Restless Overall Cognitive Status: History of cognitive impairments - at baseline       Memory: Decreased short-term memory              Exercises Other Exercises Other Exercises: Pt performed supine ther-ex on B LE including ankle pumps, SLR, and heel slides w/min to mod assist. Pt performed B UE elbow flex/ext with no assist. Pt required heavy verbal and tactile cues regarding proper speed and form w/exercises. All ther-ex performed x12 reps.      General Comments        Pertinent Vitals/Pain  Pain Assessment: Faces Faces Pain Scale: Hurts little more Pain Location: B LE, R arm Pain Intervention(s): Limited activity within patient's tolerance    Home Living                      Prior Function            PT Goals (current goals can now be found in the care plan section) Acute Rehab PT Goals Patient Stated Goal: to return to PLOF PT Goal Formulation: Patient unable to participate in goal setting Time For Goal Achievement: 08/14/15 Potential to Achieve Goals: Fair Progress towards PT goals: Progressing toward goals    Frequency  Min 2X/week    PT Plan Current plan remains appropriate    Co-evaluation             End of Session   Activity Tolerance: Patient limited by fatigue;Patient limited by pain Patient left: in bed;with call bell/phone within reach;with bed alarm set     Time:  [id number] PT Time Calculation (min) (ACUTE ONLY): 29 min  Charges:                       G Codes:      Sherral Hammers 08/15/2015, 12:00 PM M. Barnett Abu, SPT

## 2015-08-05 NOTE — Care Management Important Message (Signed)
Important Message  Patient Details  Name: [name] MRN: [id number] Date of Birth: 01/20/1930   Medicare Important Message Given:  Yes    Jolly Mango, RN 08/05/2015, 12:20 PM

## 2015-08-05 NOTE — Progress Notes (Signed)
Poor PO intake. Wound vac to L thigh. Foley. A & O but slow to answer. HOH. Family at the bedside and talk to MD [facility]. Pt has not reported any pain. Takes meds whole in apple sauce. Pt has no further concerns at this time.

## 2015-08-05 NOTE — Progress Notes (Signed)
[facility]  Daily Progress Note   Subjective  - 7 Days Post-Op  Feeling better today. Oxygen saturations 100% on nasal cannula. Respirations not is labored.  Objective Filed Vitals:   08/04/15 2020 08/05/15 0445 08/05/15 0458 08/05/15 1153  BP: 119/55 129/69  120/65  Pulse: 110 82  80  Temp: 97.9 F (36.6 C) 97.7 F (36.5 C)  97.8 F (36.6 C)  TempSrc: Oral Oral  Oral  Resp:  22  20  Height:      Weight:   97.16 kg (214 lb 3.2 oz)   SpO2: 100% 100%  100%    Intake/Output Summary (Last 24 hours) at 08/05/15 1611 Last data filed at 08/05/15 1300  Gross per 24 hour  Intake 735.83 ml  Output    950 ml  Net -214.17 ml    PULM  CTAB CV  RRR VASC  Feet are reasonably warm with decent capillary refill.  Laboratory CBC    Component Value Date/Time   WBC 13.9* 08/05/2015 0438   WBC 9.8 05/17/2014 1834   HGB 8.9* 08/05/2015 0438   HGB 11.6* 05/17/2014 1834   HCT 26.4* 08/05/2015 0438   HCT 36.6 05/17/2014 1834   PLT 301 08/05/2015 0438   PLT 242 05/17/2014 1834    BMET    Component Value Date/Time   NA 139 08/05/2015 0438   NA 137 05/17/2014 1834   K 4.4 08/05/2015 0438   K 4.4 05/17/2014 1834   CL 106 08/05/2015 0438   CL 100 05/17/2014 1834   CO2 33* 08/05/2015 0438   CO2 30 05/17/2014 1834   GLUCOSE 151* 08/05/2015 0438   GLUCOSE 99 05/17/2014 1834   BUN 40* 08/05/2015 0438   BUN 15 05/17/2014 1834   CREATININE 1.81* 08/05/2015 0438   CREATININE 1.13 05/17/2014 1834   CALCIUM 8.5* 08/05/2015 0438   CALCIUM 8.7 05/17/2014 1834   GFRNONAA 25* 08/05/2015 0438   GFRNONAA 49* 05/17/2014 1834   GFRNONAA 35* 11/29/2012 1335   GFRNONAA 35* 11/29/2012 1335   GFRAA 28* 08/05/2015 0438   GFRAA 59* 05/17/2014 1834   GFRAA 41* 11/29/2012 1335   GFRAA 40* 11/29/2012 1335    Assessment/Planning: POD #7 s/p embolectomy/thrombectomy and iliac stents   Vascular status is stable. Feet are reasonably warm with good capillary refill and  she is not having lower extremity pain. Continue anticoagulation.  Disposition planning in order. No vascular surgery needs or concerns going forward other than continued anticoagulation. Can remove the VAC on the left groin and place dry dressing as needed. Will defer to internal medicine service for multiple medications planned discharge, and also determine whether or not hospice services are appropriate after the family meeting with palliative care.    Tanush Drees  08/05/2015, 4:11 PM

## 2015-08-05 NOTE — Progress Notes (Signed)
[place] at [facility]                                                                                                                                                                                            Patient Demographics   [name], is a 82 y.o. female, DOB - 12-28-1929, BO:[id number]  Admit date - 07/28/2015   Admitting Physician Katha Cabal, MD  Outpatient Primary MD for the patient is Sarajane Jews, MD   LOS - 8  Subjective:Heart rate improved, her breathing is little improved. She stated just stated to her family that it doesn't make sense to continue to be aggressive   Review of Systems:   CONSTITUTIONAL: No documented fever. No fatigue, weakness. No weight gain, no weight loss.  EYES: No blurry or double vision.  ENT: No tinnitus. No postnasal drip. No redness of the oropharynx.  RESPIRATORY: No cough, no wheeze, no hemoptysis. Positive dyspnea.  CARDIOVASCULAR: No chest pain. No orthopnea. No palpitations. No syncope.  GASTROINTESTINAL: No nausea, no vomiting or diarrhea. No abdominal pain. No melena or hematochezia.  GENITOURINARY: No dysuria or hematuria.  ENDOCRINE: No polyuria or nocturia. No heat or cold intolerance.  HEMATOLOGY: No anemia. No bruising. No bleeding.  INTEGUMENTARY: No rashes. No lesions. Swelling of the upper extremity MUSCULOSKELETAL: No arthritis. No swelling. No gout.  NEUROLOGIC: No numbness, tingling, or ataxia. No seizure-type activity.  PSYCHIATRIC: No anxiety. No insomnia. No ADD.    Vitals:   Filed Vitals:   08/04/15 1215 08/04/15 2020 08/05/15 0445 08/05/15 0458  BP: 130/77 119/55 129/69   Pulse: 102 110 82   Temp: 98.3 F (36.8 C) 97.9 F (36.6 C) 97.7 F (36.5 C)   TempSrc: Oral Oral Oral   Resp:   22   Height:      Weight:    97.16 kg (214 lb 3.2 oz)  SpO2: 100% 100% 100%     Wt Readings from Last 3 Encounters:  08/05/15 97.16 kg (214 lb 3.2 oz)  09/20/14 90.719 kg (200  lb)  03/31/13 99.338 kg (219 lb)     Intake/Output Summary (Last 24 hours) at 08/05/15 0832 Last data filed at 08/05/15 0754  Gross per 24 hour  Intake 735.83 ml  Output    750 ml  Net -14.17 ml    Physical Exam:   GENERAL: Pleasant-appearing in no apparent distress.  HEAD, EYES, EARS, NOSE AND THROAT: Atraumatic, normocephalic. Extraocular muscles are intact. Pupils equal and reactive to light. Sclerae anicteric. No conjunctival injection. No oro-pharyngeal erythema.  NECK: Supple. There is no jugular venous distention. No  bruits, no lymphadenopathy, no thyromegaly.  HEART: Regular rate and rhythm,. No murmurs, no rubs, no clicks.  LUNGS: Clear to auscultation bilaterally. No rales or rhonchi. No wheezes.  ABDOMEN: Soft, flat, nontender, nondistended. Has good bowel sounds. No hepatosplenomegaly appreciated.  EXTREMITIES: No evidence of any cyanosis, clubbing, 1+ edema of the lower extremity And upper extremity NEUROLOGIC: The patient is alert, awake, and oriented x3 with no focal motor or sensory deficits appreciated bilaterally.  SKIN: Moist and warm with no rashes appreciated.  Psych: Not anxious, depressed LN: No inguinal LN enlargement    Antibiotics   Anti-infectives    Start     Dose/Rate Route Frequency Ordered Stop   07/30/15 0600  piperacillin-tazobactam (ZOSYN) IVPB 3.375 g     3.375 g 12.5 mL/hr over 240 Minutes Intravenous 3 times per day 07/30/15 0403     07/29/15 2230  cefUROXime (ZINACEF) 1.5 g in dextrose 5 % 50 mL IVPB     1.5 g 100 mL/hr over 30 Minutes Intravenous Every 12 hours 07/29/15 2218 07/30/15 1121      Medications   Scheduled Meds: . amiodarone  400 mg Oral BID  . apixaban  2.5 mg Oral BID  . bacitracin  1 application Topical BID  . dexamethasone  2 mg Oral 3 times per day  . digoxin  0.0625 mg Oral Daily  . docusate sodium  100 mg Oral BID  . DULoxetine  60 mg Oral Daily  . feeding supplement (ENSURE ENLIVE)  237 mL Oral BID BM  .  furosemide  20 mg Intravenous Q12H  . levothyroxine  75 mcg Oral QAC breakfast  . metoprolol tartrate  25 mg Oral BID  . piperacillin-tazobactam (ZOSYN)  IV  3.375 g Intravenous 3 times per day  . QUEtiapine  25 mg Oral QHS  . sodium chloride flush  3 mL Intravenous Q12H  . sodium chloride flush  3 mL Intravenous Q12H   Continuous Infusions: . sodium chloride 10 mL/hr at 08/03/15 1631   PRN Meds:.acetaminophen **OR** acetaminophen, albuterol, fentaNYL, fentaNYL (SUBLIMAZE) injection, HYDROcodone-acetaminophen, magnesium hydroxide, morphine injection, [DISCONTINUED] ondansetron **OR** ondansetron (ZOFRAN) IV, ondansetron (ZOFRAN) IV, polyethylene glycol, sodium chloride flush, sodium chloride flush, sorbitol   Data Review:   Micro Results Recent Results (from the past 240 hour(s))  MRSA PCR Screening     Status: Abnormal   Collection Time: 07/28/15 10:00 PM  Result Value Ref Range Status   MRSA by PCR POSITIVE (A) NEGATIVE Final    Comment:        The GeneXpert MRSA Assay (FDA approved for NASAL specimens only), is one component of a comprehensive MRSA colonization surveillance program. It is not intended to diagnose MRSA infection nor to guide or monitor treatment for MRSA infections. CRITICAL RESULT CALLED TO, READ BACK BY AND VERIFIED WITH: CALLED TO LISA ROMERO @ [id number] ON 07/28/2015 BY CAF     Radiology Reports Dg Lumbar Spine 2-3 Views  07/28/2015  CLINICAL DATA:  Low back and left hip pain.  No time course given. EXAM: LUMBAR SPINE - 2-3 VIEW; DG HIP (WITH OR WITHOUT PELVIS) 2-3V LEFT COMPARISON:  CT scan 09/19/2014 FINDINGS: Lumbar spine: Left convex lumbar scoliosis and degenerative lumbar spondylosis. Stable vertebral augmentation changes at L2. There is also a remote compression fracture of L1. No acute bony findings. Stable advanced facet disease. No obvious pars defects. Stable aortic and iliac artery calcifications. The visualized bony pelvis is intact. Pelvis/left hip:  Both hips are normally located. Moderate degenerative changes  bilaterally. The pubic symphysis and SI joints are intact. No pelvic fractures. Remote intra medullary rod and proximal dynamic hip screw transfixing a remote intertrochanteric fracture. No acute bony findings. The hardware is intact. IMPRESSION: 1. Remote lumbar compression fractures. No acute abnormality. Stable scoliosis and degenerative lumbar spondylosis. 2. Intact left hip hardware.  No acute hip or pelvic fracture. Electronically Signed   By: Marijo Sanes M.D.   On: 07/28/2015 16:25   Ct Head Wo Contrast  08/01/2015  CLINICAL DATA:  Recent mental status changes.  Confusion. EXAM: CT HEAD WITHOUT CONTRAST TECHNIQUE: Contiguous axial images were obtained from the base of the skull through the vertex without intravenous contrast. COMPARISON:  09/12/2014.  09/11/2014. FINDINGS: There is generalized brain atrophy. There chronic small-vessel ischemic changes affecting the deep white matter. No sign of acute infarction. No mass lesion, hemorrhage or extra-axial collection. Chronic ventriculomegaly is felt secondary to central atrophy. This appears unchanged. No calvarial abnormality. Sinuses, middle ears and mastoids are clear. IMPRESSION: No acute or reversible finding. Atrophy and chronic small-vessel ischemic changes of the deep white matter. Chronic ventriculomegaly felt secondary to central atrophy. Electronically Signed   By: Nelson Chimes M.D.   On: 08/01/2015 11:36   US Venous Img Lower Bilateral  07/28/2015  CLINICAL DATA:  Bilateral leg pain EXAM: BILATERAL LOWER EXTREMITY VENOUS DOPPLER ULTRASOUND TECHNIQUE: Gray-scale sonography with graded compression, as well as color Doppler and duplex ultrasound were performed to evaluate the lower extremity deep venous systems from the level of the common femoral vein and including the common femoral, femoral, profunda femoral, popliteal and calf veins including the posterior tibial, peroneal and  gastrocnemius veins when visible. The superficial great saphenous vein was also interrogated. Spectral Doppler was utilized to evaluate flow at rest and with distal augmentation maneuvers in the common femoral, femoral and popliteal veins. COMPARISON:  None. FINDINGS: RIGHT LOWER EXTREMITY Common Femoral Vein: No evidence of thrombus. Normal compressibility, respiratory phasicity and response to augmentation. Saphenofemoral Junction: No evidence of thrombus. Normal compressibility and flow on color Doppler imaging. Profunda Femoral Vein: No evidence of thrombus. Normal compressibility and flow on color Doppler imaging. Femoral Vein: No evidence of thrombus. Normal compressibility, respiratory phasicity and response to augmentation. Popliteal Vein: No evidence of thrombus. Normal compressibility, respiratory phasicity and response to augmentation. Calf Veins: No evidence of thrombus. Normal compressibility and flow on color Doppler imaging. Superficial Great Saphenous Vein: No evidence of thrombus. Normal compressibility and flow on color Doppler imaging. Venous Reflux:  None. Other Findings:  None. LEFT LOWER EXTREMITY Common Femoral Vein: No evidence of thrombus. Normal compressibility, respiratory phasicity and response to augmentation. Saphenofemoral Junction: No evidence of thrombus. Normal compressibility and flow on color Doppler imaging. Profunda Femoral Vein: No evidence of thrombus. Normal compressibility and flow on color Doppler imaging. Femoral Vein: No evidence of thrombus. Normal compressibility, respiratory phasicity and response to augmentation. Popliteal Vein: No evidence of thrombus. Normal compressibility, respiratory phasicity and response to augmentation. Calf Veins: No evidence of thrombus. Normal compressibility and flow on color Doppler imaging. Superficial Great Saphenous Vein: No evidence of thrombus. Normal compressibility and flow on color Doppler imaging. Venous Reflux:  None. Other  Findings:  None. IMPRESSION: No evidence of deep venous thrombosis. Electronically Signed   By: Inez Catalina M.D.   On: 07/28/2015 19:11   Dg Chest Port 1 View  08/03/2015  CLINICAL DATA:  82 year old female with a history of not feeling well EXAM: PORTABLE CHEST 1 VIEW COMPARISON:  07/30/2015, 07/29/2015 FINDINGS:  Cardiomediastinal silhouette unchanged with cardiomegaly. Atherosclerotic calcification of the aortic arch. Bibasilar opacities. Retrocardiac region is opacified. Obscuration the left hemidiaphragm. Unchanged position of right IJ approach central venous catheter which appears to terminate in the superior vena cava. Interval extubation. Overlying EKG leads. IMPRESSION: Low lung volumes status postextubation, with basilar opacities, potentially edema, atelectasis, and/ or consolidation. Left pleural effusion not excluded. Unchanged right IJ central venous catheter. Signed, Dulcy Fanny. Earleen Newport, DO [facility] Radiology Electronically Signed   By: Corrie Mckusick D.O.   On: 08/03/2015 14:54   Dg Chest Port 1 View  07/30/2015  CLINICAL DATA:  Weakness. EXAM: PORTABLE CHEST 1 VIEW COMPARISON:  07/29/2015. FINDINGS: Endotracheal tube repositioned 2.5 cm above the carina. Right IJ line and stable position. Mediastinum and hilar structures are normal. Stable cardiomegaly . Bibasilar atelectasis and/or infiltrates. Small left pleural effusion. No pneumothorax. IMPRESSION: 1. Interim repositioning of endotracheal tube, its tip is now 2.5 cm above the carina in good anatomic position. Right IJ line stable position. 2. Bibasilar atelectasis and/or infiltrates. Small left pleural effusion. No interim change from prior exam . Electronically Signed   By: [place]   On: 07/30/2015 07:57   Portable Chest Xray  07/29/2015  CLINICAL DATA:  Intubation. EXAM: PORTABLE CHEST 1 VIEW COMPARISON:  09/11/2014 FINDINGS: The endotracheal tube is at the level of the carina,  directed toward the right mainstem. There is a right jugular central line with tip in the low SVC. There is no pneumothorax. There is left base consolidation due to atelectasis or infiltrate. Right lung is grossly clear. Pulmonary vasculature is normal. IMPRESSION: 1. Endotracheal tube is at the level of the carina, directed to the right mainstem bronchus origin. Recommend withdrawing the tube 2-3 cm. 2. Right jugular central line.  No pneumothorax. 3. Left base consolidation. Electronically Signed   By: Andreas Newport M.D.   On: 07/29/2015 23:23   Dg Hip Unilat With Pelvis 2-3 Views Left  07/28/2015  CLINICAL DATA:  Low back and left hip pain.  No time course given. EXAM: LUMBAR SPINE - 2-3 VIEW; DG HIP (WITH OR WITHOUT PELVIS) 2-3V LEFT COMPARISON:  CT scan 09/19/2014 FINDINGS: Lumbar spine: Left convex lumbar scoliosis and degenerative lumbar spondylosis. Stable vertebral augmentation changes at L2. There is also a remote compression fracture of L1. No acute bony findings. Stable advanced facet disease. No obvious pars defects. Stable aortic and iliac artery calcifications. The visualized bony pelvis is intact. Pelvis/left hip: Both hips are normally located. Moderate degenerative changes bilaterally. The pubic symphysis and SI joints are intact. No pelvic fractures. Remote intra medullary rod and proximal dynamic hip screw transfixing a remote intertrochanteric fracture. No acute bony findings. The hardware is intact. IMPRESSION: 1. Remote lumbar compression fractures. No acute abnormality. Stable scoliosis and degenerative lumbar spondylosis. 2. Intact left hip hardware.  No acute hip or pelvic fracture. Electronically Signed   By: Marijo Sanes M.D.   On: 07/28/2015 16:25     CBC  Recent Labs Lab 08/01/15 0740 08/02/15 0500 08/03/15 0510 08/04/15 0422 08/05/15 0438  WBC 11.5* 12.8* 16.4* 15.5* 13.9*  HGB 8.2* 8.6* 8.6* 8.4* 8.9*  HCT 24.8* 25.9* 26.6* 25.8* 26.4*  PLT 223 246 303 300 301   MCV 91.8 91.2 92.0 92.7 95.9  MCH 30.2 30.1 29.6 30.3 32.3  MCHC 32.9 33.0 32.1 32.7 33.7  RDW 14.5 14.7* 14.8* 15.2* 15.7*    Chemistries   Recent Labs Lab 08/01/15 2247 08/02/15 1055 08/03/15  0510 08/04/15 0422 08/05/15 0438  NA 138 138 138 138 139  K 2.8* 3.7 3.4* 3.4* 4.4  CL 108 107 106 104 106  CO2 30 25 27 30  33*  GLUCOSE 157* 185* 170* 173* 151*  BUN 32* 33* 37* 40* 40*  CREATININE 1.58* 1.60* 1.67* 1.83* 1.81*  CALCIUM 8.0* 8.5* 8.6* 8.4* 8.5*  MG  --  1.8 1.7 2.1 2.0   ------------------------------------------------------------------------------------------------------------------ estimated creatinine clearance is 24.2 mL/min (by C-G formula based on Cr of 1.81). ------------------------------------------------------------------------------------------------------------------ No results for input(s): HGBA1C in the last 72 hours. ------------------------------------------------------------------------------------------------------------------ No results for input(s): CHOL, HDL, LDLCALC, TRIG, CHOLHDL, LDLDIRECT in the last 72 hours. ------------------------------------------------------------------------------------------------------------------ No results for input(s): TSH, T4TOTAL, T3FREE, THYROIDAB in the last 72 hours.  Invalid input(s): FREET3 ------------------------------------------------------------------------------------------------------------------ No results for input(s): VITAMINB12, FOLATE, FERRITIN, TIBC, IRON, RETICCTPCT in the last 72 hours.  Coagulation profile No results for input(s): INR, PROTIME in the last 168 hours.  No results for input(s): DDIMER in the last 72 hours.  Cardiac Enzymes  Recent Labs Lab 07/30/15 0327 08/01/15 1611  TROPONINI 2.44* 0.26*   ------------------------------------------------------------------------------------------------------------------ Invalid input(s): POCBNP    Assessment & Plan  The  patient is a 82 year old white female with history of paroxysmal atrial fibrillation, , hypertension, hypothyroidism, myasthenia gravis, obstructive sleep apnea who was evaluated for left leg pain. 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 Status post thrombolysis and open embolectomy on 07/29/2015. Postop patient developed acute respiratory failure and hypotension. She was followed by the intensivist now now we're Following patient in consultation for the vascular surgery  1. Shortness of breath with acute respiratory failure:  Due to acute on chronic systolic CHF continue IV Lasix Recommended incentive spirometry every hour  2. Acute on chronic systolic CHF start IV Lasix,  Continue IV diuresis  3. Atrial fibrillation off amiodarone drip on oral amiodarone   on apixaban Continue metoprolol and digitoxin heart rate improved   4. Ischemic leg status post embolectomy primary care team and vascular is addressing  5. Anemia due to acute blood loss as a result of surgery monitor hemoglobin  6. Elevated troponin due to demand ischemia  7. Hypothyroidism continue Synthroid as taking at home stable  8. Elevated WBC count in setting of patient being on IV stress dose steroids currently afebrile WBC trending down  9. Miscellaneous for DVT prophylaxis on Eliquis  10. Code DO NOT RESUSCITATE patient's daughter requesting palliative care and put also open to the idea of hospice following her skilled nursing facility with a goal of comfort care I will discuss with social worker regarding plan a care     Code Status Orders        Start     Ordered   08/03/15 1032  Do not attempt resuscitation (DNR)   Continuous    Question Answer Comment  In the event of cardiac or respiratory ARREST Do not call a "code blue"   In the event of cardiac or respiratory ARREST Do not perform Intubation, CPR, defibrillation or ACLS   In the event of cardiac or respiratory ARREST Use  medication by any route, position, wound care, and other measures to relive pain and suffering. May use oxygen, suction and manual treatment of airway obstruction as needed for comfort.      08/03/15 1031    Code Status History    Date Active Date Inactive Code Status Order ID Comments User Context   07/28/2015  5:52 PM 08/03/2015 10:31 AM Full  Code [id number]  Katha Cabal, MD ED   09/12/2014  2:52 AM 09/20/2014  7:49 PM DNR [id number]  Juluis Mire, MD Inpatient             DVT Prophylaxis  eliquis  Lab Results  Component Value Date   PLT 301 08/05/2015     Time Spent in minutes   25min  Greater than 50% of time spent in care coordination and counseling patient regarding the condition and plan of care.   Dustin Flock M.D on 08/05/2015 at 8:32 AM  Between 7am to 6pm - Pager - [phone]  After 6pm go to [email or web address] - password [email or web address] [facility]   Office  [phone]

## 2015-08-06 LAB — ALBUMIN: ALBUMIN: 2 g/dL — AB (ref 3.5–5.0)

## 2015-08-06 MED ORDER — MORPHINE SULFATE (CONCENTRATE) 10 MG/0.5ML PO SOLN
ORAL | Status: AC
  Administered 2015-08-06: 20 mg
  Filled 2015-08-06: qty 0.5

## 2015-08-06 MED ORDER — MORPHINE SULFATE (CONCENTRATE) 10 MG/0.5ML PO SOLN
ORAL | Status: AC
  Filled 2015-08-06: qty 0.5

## 2015-08-06 NOTE — Care Management (Signed)
Spoke with daughter. She prefers [facility]. Referral called to Santiago Glad with [facility] [place].

## 2015-08-06 NOTE — Progress Notes (Addendum)
Palliative Medicine Inpatient Consult Follow Up Note   Name: [name] Date: 08/06/2015 MRN: [id number]  DOB: Jan 07, 1930  Referring Physician: Katha Cabal, MD  Palliative Care consult requested for this 81 y.o. female for goals of medical therapy in patient with recent interventions for ischemic legs and AFib RVR.  TODAY'S PLAN: Pt continues to have signs and symptoms and diagnoses that are entirely consistent with appropriately being referred for Hospice Care. She is not having a lot of symptoms that require close management (though she does hurt when being turned). She is not eating well, but I haven't been able to evaluate the daily intake as its been less than a day since we began closely watching intake. I saw her lunch tray today after she had finished.  She ate an entire ice cream, part of another ice cream, two bites of mashed potatoes and possibly a bit or two of her other food on the plate and most of her beverage (tea).  This is about 30%.    She is in normal sinus rhythm based on my exam.    Family does not want her to have the amiodarone or the eliquis.  Pt could go into a tachyarrhythmia soon OR she might remain in sinus rhythm given that the major stressor on her heart (emboli in leg arteries) has been taken care of.  Family feels this approach is c/w pts expressed wishes (pt herself is vague about specific meds but not vague about her overall wishes to have comfort care only).    I have talked with Care Mgmt, Soc Work, [employer], and Dr. Lucky Cowboy.  I have also talked with pt and family.  The current plan is to have pt go to [facility] as a Hospice pt.  NO REHAB is desired by pt or family.    The foley needs to remain for comfort and also to keep urine off her incisions (which could be painful).  Pt is a comfort care pt so this should not be a problem.  The wound vac is another issue. Will ask Dr. Lucky Cowboy to determine what needs to stay or what can change in  terms of wound management in a pt who is now under comfort care status.    IMPRESSION: Ischemic legs (blaiterally) ---she had 6 hours of lytic therapy with intervention on left with excellent results on right and significant improvement on left limb.  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 Hypertension   . Thyroid disorder   . Lymphoma ([employer])   . Hypothyroidism   . Arthritis   . Diverticulosis   . Obstructive sleep apnea   . Myasthenia gravis ([place])   . Hip fracture, left ([place])   . Fracture of right humerus   .  Asthma   . PAF (paroxysmal atrial fibrillation) ([place])     a. diagnosed in 2016. Refused anticoagulation  . Chronic systolic CHF (congestive heart failure) ([place])     a. 07/2015: EF 35-40% with hypokinesis of the anterior and apical myocardium  . Ischemic leg     a. 07/2015: s/p thrombolysis and open embolectomy of left iliofemoral system    PAST SURGICAL HISTORY:  Past Surgical History  Procedure Laterality Date  . Nm pet lymphoma    . Hernia repair    . Cholecystectomy    . Back surgery    . Embolectomy Left 07/29/2015    Procedure: EMBOLECTOMY/ Left Femoral;  Surgeon: Katha Cabal, MD;  Location: [facility] ORS;  Service:  Vascular;  Laterality: Left;  . Peripheral vascular catheterization Bilateral 07/29/2015    Procedure: Lower Extremity Angiography;  Surgeon: Katha Cabal, MD;  Location: [facility];  Service: Cardiovascular;  Laterality: Bilateral;  . Peripheral vascular catheterization  07/29/2015    Procedure: Lower Extremity Intervention;  Surgeon: Katha Cabal, MD;  Location: [facility];  Service: Cardiovascular;;  . Peripheral vascular catheterization N/A 07/29/2015    Procedure: Lower Extremity Angiography;  Surgeon: Algernon Huxley, MD;  Location: [facility];  Service: Cardiovascular;  Laterality: N/A;  . Peripheral vascular catheterization  07/29/2015    Procedure: Lower Extremity Intervention;  Surgeon: Algernon Huxley, MD;  Location: [place] [facility];  Service: Cardiovascular;;    Vital Signs: BP 100/56 mmHg  Pulse 68  Temp(Src) 98.4 F (36.9 C) (Oral)  Resp 16  Ht 5' (1.524 m)  Wt 97.16 kg (214 lb 3.2 oz)  BMI 41.83 kg/m2  SpO2 98% Filed Weights   08/03/15 0455 08/04/15 0500 08/05/15 0458  Weight: 101.3 kg (223 lb 5.2 oz) 97.024 kg (213 lb 14.4 oz) 97.16 kg (214 lb 3.2 oz)    Estimated body mass index is 41.83 kg/(m^2) as calculated from the following:   Height as of this encounter: 5' (1.524 m).   Weight as of this encounter: 97.16 kg (214 lb 3.2 oz).  PHYSICAL EXAM: Pt is sleeping a lot --but on one visit she wakened. EOMI OP clear Neck w/o JVD or TM Hrt rrr no m --distant and a few irreg beats heard Lungs with decreased BS in bases Abd soft and NT Ext no mottling or cyanosis   LABS: CBC:    Component Value Date/Time   WBC 13.9* 08/05/2015 0438   WBC 9.8 05/17/2014 1834   HGB 8.9* 08/05/2015 0438   HGB 11.6* 05/17/2014 1834   HCT 26.4* 08/05/2015 0438   HCT 36.6 05/17/2014 1834   PLT 301 08/05/2015 0438   PLT 242 05/17/2014 1834   MCV 95.9 08/05/2015 0438   MCV 91 05/17/2014 1834   NEUTROABS 5.2 09/11/2014 2233   NEUTROABS 6.2 05/17/2014  1834   LYMPHSABS 1.6 09/11/2014 2233   LYMPHSABS 2.6 05/17/2014 1834   MONOABS 0.9 09/11/2014 2233   MONOABS 0.8 05/17/2014 1834   EOSABS 0.2 09/11/2014 2233   EOSABS 0.2 05/17/2014 1834   BASOSABS 0.1 09/11/2014 2233   BASOSABS 0.1 05/17/2014 1834   BASOSABS 1 03/22/2014 0530   Comprehensive Metabolic Panel:    Component Value Date/Time   NA 139 08/05/2015 0438   NA 137 05/17/2014 1834   K 4.4 08/05/2015 0438   K 4.4 05/17/2014 1834   CL 106 08/05/2015 0438   CL 100 05/17/2014 1834   CO2 33* 08/05/2015 0438  CO2 30 05/17/2014 1834   BUN 40* 08/05/2015 0438   BUN 15 05/17/2014 1834   CREATININE 1.81* 08/05/2015 0438   CREATININE 1.13 05/17/2014 1834   GLUCOSE 151* 08/05/2015 0438   GLUCOSE 99 05/17/2014 1834   CALCIUM 8.5* 08/05/2015 0438   CALCIUM 8.7 05/17/2014 1834   AST 22 09/11/2014 2233   AST 39* 05/17/2014 1834   ALT 21 09/11/2014 2233   ALT 24 05/17/2014 1834   ALKPHOS 72 09/11/2014 2233   ALKPHOS 82 05/17/2014 1834   BILITOT 0.6 09/11/2014 2233   BILITOT 0.3 05/17/2014 1834   PROT 6.6 09/11/2014 2233   PROT 6.5 05/17/2014 1834   ALBUMIN 2.0* 08/06/2015 1536   ALBUMIN 2.6* 05/17/2014 1834    More than 50% of the visit was spent in counseling/coordination of care: YES  Time Spent:  35 min

## 2015-08-06 NOTE — Progress Notes (Addendum)
Per Seth Bake admissions coordinator at [facility] they can take patient under long term care private pay with [place]/ [facility] following on tomorrow 08/07/15. Clinical Social Worker (CSW) left patient's daughter Almyra Free a voicemail making her aware of above. CSW also contacted patient's daughter Sharee Pimple and made her aware of above. [facility] liaison is aware of above. CSW will continue to follow and assist as needed.   Patient's daughter Almyra Free called CSW and reported that [facility] has been in contact with her. Daughter is in agreement for patient to D/C tomorrow to [facility].   Blima Rich, LCSW [phone]

## 2015-08-06 NOTE — Progress Notes (Signed)
    Reviewed updates on patient and discussed with MD. Amiodarone could help to make patient more comfortable should she develop any tachyarrhythmias. She is not currently on telemetry given the patient is now comfort care only. Upon review of her vital signs her heart rate has been in the 60 bpm range. Could continue amiodarone 200 mg daily, and titrate up to 200 mg bid should tachyarrhythmias develop in an effort in increase patient's comfort. Would want Palliative Care to agree with this medication as well before restarting. We ask that Palliative Care please review and start vs page [place] to start if felt appropriate. Would hold Eliquis at this time given patient is now comfort care.   Christell Faith, PA-C 08/06/2015 10:09 AM

## 2015-08-06 NOTE — Progress Notes (Signed)
[facility]  Daily Progress Note   Subjective  - 8 Days Post-Op  No events. No more aggressive care planned  Objective Filed Vitals:   08/05/15 1153 08/05/15 1520 08/05/15 1930 08/06/15 0459  BP: 120/65  129/59 100/56  Pulse: 80  90 68  Temp: 97.8 F (36.6 C)  98.2 F (36.8 C) 98.4 F (36.9 C)  TempSrc: Oral  Oral   Resp: 20  16 16   Height:      Weight:      SpO2: 100% 100% 100% 98%    Intake/Output Summary (Last 24 hours) at 08/06/15 1418 Last data filed at 08/06/15 0502  Gross per 24 hour  Intake      0 ml  Output   1650 ml  Net  -1650 ml    PULM  CTAB CV  RRR VASC  Feet with good capillary refill, VAC with good seal  Laboratory CBC    Component Value Date/Time   WBC 13.9* 08/05/2015 0438   WBC 9.8 05/17/2014 1834   HGB 8.9* 08/05/2015 0438   HGB 11.6* 05/17/2014 1834   HCT 26.4* 08/05/2015 0438   HCT 36.6 05/17/2014 1834   PLT 301 08/05/2015 0438   PLT 242 05/17/2014 1834    BMET    Component Value Date/Time   NA 139 08/05/2015 0438   NA 137 05/17/2014 1834   K 4.4 08/05/2015 0438   K 4.4 05/17/2014 1834   CL 106 08/05/2015 0438   CL 100 05/17/2014 1834   CO2 33* 08/05/2015 0438   CO2 30 05/17/2014 1834   GLUCOSE 151* 08/05/2015 0438   GLUCOSE 99 05/17/2014 1834   BUN 40* 08/05/2015 0438   BUN 15 05/17/2014 1834   CREATININE 1.81* 08/05/2015 0438   CREATININE 1.13 05/17/2014 1834   CALCIUM 8.5* 08/05/2015 0438   CALCIUM 8.7 05/17/2014 1834   GFRNONAA 25* 08/05/2015 0438   GFRNONAA 49* 05/17/2014 1834   GFRNONAA 35* 11/29/2012 1335   GFRNONAA 35* 11/29/2012 1335   GFRAA 28* 08/05/2015 0438   GFRAA 59* 05/17/2014 1834   GFRAA 41* 11/29/2012 1335   GFRAA 40* 11/29/2012 1335    Assessment/Planning: POD #8 s/p LE revascularization   No more aggressive care planned  Discussed with Palliative care service, and plan is for [place] with Hospice services tomorrow.  Sounds reasonable at this  point  Plan discharge tomorrow.  Family has no questions for me currently    [name]  08/06/2015, 2:18 PM

## 2015-08-06 NOTE — Progress Notes (Signed)
Clinical Social Worker (CSW) met with patient's daughter Julie Blythe at bedside to present bed offers for SNF long term care private pay. Patient had 3 offers [facility] and [facility]. CSW also discussed finances for private pay at SNF. Per daughter she would like to talk with her sister and tour [facility]. CSW made daughter aware that per RN Case Manager patient is stable for D/C and the sooner a decision can be made the better. Daughter verbalized her understanding and thanked CSW for visit.    [place], LCSW [phone] 

## 2015-08-06 NOTE — Progress Notes (Signed)
[place] at [facility]                                                                                                                                                                                            Patient Demographics   [name], is a 82 y.o. female, DOB - 07/28/1929, [id number]  Admit date - 07/28/2015   Admitting Physician Katha Cabal, MD  Outpatient Primary MD for the patient is Sarajane Jews, MD   LOS - 9  Subjective: Pt feeling better. SHE is comfortable Seen by palliative care yesterday all medications that were nonessential stopped  Review of Systems:   CONSTITUTIONAL: No documented fever. No fatigue, weakness. No weight gain, no weight loss.  EYES: No blurry or double vision.  ENT: No tinnitus. No postnasal drip. No redness of the oropharynx.  RESPIRATORY: No cough, no wheeze, no hemoptysis. Positive dyspnea.  CARDIOVASCULAR: No chest pain. No orthopnea. No palpitations. No syncope.  GASTROINTESTINAL: No nausea, no vomiting or diarrhea. No abdominal pain. No melena or hematochezia.  GENITOURINARY: No dysuria or hematuria.  ENDOCRINE: No polyuria or nocturia. No heat or cold intolerance.  HEMATOLOGY: No anemia. No bruising. No bleeding.  INTEGUMENTARY: No rashes. No lesions. Swelling of the upper extremity MUSCULOSKELETAL: No arthritis. No swelling. No gout.  NEUROLOGIC: No numbness, tingling, or ataxia. No seizure-type activity.  PSYCHIATRIC: No anxiety. No insomnia. No ADD.    Vitals:   Filed Vitals:   08/05/15 1153 08/05/15 1520 08/05/15 1930 08/06/15 0459  BP: 120/65  129/59 100/56  Pulse: 80  90 68  Temp: 97.8 F (36.6 C)  98.2 F (36.8 C) 98.4 F (36.9 C)  TempSrc: Oral  Oral   Resp: 20  16 16   Height:      Weight:      SpO2: 100% 100% 100% 98%    Wt Readings from Last 3 Encounters:  08/05/15 97.16 kg (214 lb 3.2 oz)  09/20/14 90.719 kg (200 lb)  03/31/13 99.338 kg (219 lb)      Intake/Output Summary (Last 24 hours) at 08/06/15 0837 Last data filed at 08/06/15 0502  Gross per 24 hour  Intake      0 ml  Output   1850 ml  Net  -1850 ml    Physical Exam:   GENERAL: Pleasant-appearing in no apparent distress.  HEAD, EYES, EARS, NOSE AND THROAT: Atraumatic, normocephalic. Extraocular muscles are intact. Pupils equal and reactive to light. Sclerae anicteric. No conjunctival injection. No oro-pharyngeal erythema.  NECK: Supple. There is no jugular venous distention. No bruits, no lymphadenopathy, no thyromegaly.  HEART: Regular  rate and rhythm,. No murmurs, no rubs, no clicks.  LUNGS: Clear to auscultation bilaterally. No rales or rhonchi. No wheezes.  ABDOMEN: Soft, flat, nontender, nondistended. Has good bowel sounds. No hepatosplenomegaly appreciated.  EXTREMITIES: No evidence of any cyanosis, clubbing, 1+ edema of the lower extremity And upper extremity NEUROLOGIC: The patient is alert, awake, and oriented x3 with no focal motor or sensory deficits appreciated bilaterally.  SKIN: Moist and warm with no rashes appreciated.  Psych: Not anxious, depressed LN: No inguinal LN enlargement    Antibiotics   Anti-infectives    Start     Dose/Rate Route Frequency Ordered Stop   07/30/15 0600  piperacillin-tazobactam (ZOSYN) IVPB 3.375 g  Status:  Discontinued     3.375 g 12.5 mL/hr over 240 Minutes Intravenous 3 times per day 07/30/15 0403 08/05/15 1822   07/29/15 2230  cefUROXime (ZINACEF) 1.5 g in dextrose 5 % 50 mL IVPB     1.5 g 100 mL/hr over 30 Minutes Intravenous Every 12 hours 07/29/15 2218 07/30/15 1121      Medications   Scheduled Meds: . bacitracin  1 application Topical BID  . DULoxetine  60 mg Oral Daily  . morphine CONCENTRATE  5 mg Oral 3 times per day  . morphine CONCENTRATE      . QUEtiapine  25 mg Oral QHS  . sodium chloride flush  3 mL Intravenous Q12H   Continuous Infusions: . sodium chloride 10 mL/hr at 08/03/15 1631   PRN  Meds:.acetaminophen **OR** acetaminophen, albuterol, bisacodyl, glycopyrrolate, morphine CONCENTRATE, morphine CONCENTRATE, ondansetron (ZOFRAN) IV, polyethylene glycol, prochlorperazine, sodium chloride flush, sorbitol   Data Review:   Micro Results Recent Results (from the past 240 hour(s))  MRSA PCR Screening     Status: Abnormal   Collection Time: 07/28/15 10:00 PM  Result Value Ref Range Status   MRSA by PCR POSITIVE (A) NEGATIVE Final    Comment:        The GeneXpert MRSA Assay (FDA approved for NASAL specimens only), is one component of a comprehensive MRSA colonization surveillance program. It is not intended to diagnose MRSA infection nor to guide or monitor treatment for MRSA infections. CRITICAL RESULT CALLED TO, READ BACK BY AND VERIFIED WITH: CALLED TO LISA ROMERO @ [id number] ON 07/28/2015 BY CAF     Radiology Reports Dg Lumbar Spine 2-3 Views  07/28/2015  CLINICAL DATA:  Low back and left hip pain.  No time course given. EXAM: LUMBAR SPINE - 2-3 VIEW; DG HIP (WITH OR WITHOUT PELVIS) 2-3V LEFT COMPARISON:  CT scan 09/19/2014 FINDINGS: Lumbar spine: Left convex lumbar scoliosis and degenerative lumbar spondylosis. Stable vertebral augmentation changes at L2. There is also a remote compression fracture of L1. No acute bony findings. Stable advanced facet disease. No obvious pars defects. Stable aortic and iliac artery calcifications. The visualized bony pelvis is intact. Pelvis/left hip: Both hips are normally located. Moderate degenerative changes bilaterally. The pubic symphysis and SI joints are intact. No pelvic fractures. Remote intra medullary rod and proximal dynamic hip screw transfixing a remote intertrochanteric fracture. No acute bony findings. The hardware is intact. IMPRESSION: 1. Remote lumbar compression fractures. No acute abnormality. Stable scoliosis and degenerative lumbar spondylosis. 2. Intact left hip hardware.  No acute hip or pelvic fracture. Electronically  Signed   By: Marijo Sanes M.D.   On: 07/28/2015 16:25   Ct Head Wo Contrast  08/01/2015  CLINICAL DATA:  Recent mental status changes.  Confusion. EXAM: CT HEAD WITHOUT CONTRAST TECHNIQUE: Contiguous axial  images were obtained from the base of the skull through the vertex without intravenous contrast. COMPARISON:  09/12/2014.  09/11/2014. FINDINGS: There is generalized brain atrophy. There chronic small-vessel ischemic changes affecting the deep white matter. No sign of acute infarction. No mass lesion, hemorrhage or extra-axial collection. Chronic ventriculomegaly is felt secondary to central atrophy. This appears unchanged. No calvarial abnormality. Sinuses, middle ears and mastoids are clear. IMPRESSION: No acute or reversible finding. Atrophy and chronic small-vessel ischemic changes of the deep white matter. Chronic ventriculomegaly felt secondary to central atrophy. Electronically Signed   By: Nelson Chimes M.D.   On: 08/01/2015 11:36   US Venous Img Lower Bilateral  07/28/2015  CLINICAL DATA:  Bilateral leg pain EXAM: BILATERAL LOWER EXTREMITY VENOUS DOPPLER ULTRASOUND TECHNIQUE: Gray-scale sonography with graded compression, as well as color Doppler and duplex ultrasound were performed to evaluate the lower extremity deep venous systems from the level of the common femoral vein and including the common femoral, femoral, profunda femoral, popliteal and calf veins including the posterior tibial, peroneal and gastrocnemius veins when visible. The superficial great saphenous vein was also interrogated. Spectral Doppler was utilized to evaluate flow at rest and with distal augmentation maneuvers in the common femoral, femoral and popliteal veins. COMPARISON:  None. FINDINGS: RIGHT LOWER EXTREMITY Common Femoral Vein: No evidence of thrombus. Normal compressibility, respiratory phasicity and response to augmentation. Saphenofemoral Junction: No evidence of thrombus. Normal compressibility and flow on color  Doppler imaging. Profunda Femoral Vein: No evidence of thrombus. Normal compressibility and flow on color Doppler imaging. Femoral Vein: No evidence of thrombus. Normal compressibility, respiratory phasicity and response to augmentation. Popliteal Vein: No evidence of thrombus. Normal compressibility, respiratory phasicity and response to augmentation. Calf Veins: No evidence of thrombus. Normal compressibility and flow on color Doppler imaging. Superficial Great Saphenous Vein: No evidence of thrombus. Normal compressibility and flow on color Doppler imaging. Venous Reflux:  None. Other Findings:  None. LEFT LOWER EXTREMITY Common Femoral Vein: No evidence of thrombus. Normal compressibility, respiratory phasicity and response to augmentation. Saphenofemoral Junction: No evidence of thrombus. Normal compressibility and flow on color Doppler imaging. Profunda Femoral Vein: No evidence of thrombus. Normal compressibility and flow on color Doppler imaging. Femoral Vein: No evidence of thrombus. Normal compressibility, respiratory phasicity and response to augmentation. Popliteal Vein: No evidence of thrombus. Normal compressibility, respiratory phasicity and response to augmentation. Calf Veins: No evidence of thrombus. Normal compressibility and flow on color Doppler imaging. Superficial Great Saphenous Vein: No evidence of thrombus. Normal compressibility and flow on color Doppler imaging. Venous Reflux:  None. Other Findings:  None. IMPRESSION: No evidence of deep venous thrombosis. Electronically Signed   By: Inez Catalina M.D.   On: 07/28/2015 19:11   Dg Chest Port 1 View  08/03/2015  CLINICAL DATA:  82 year old female with a history of not feeling well EXAM: PORTABLE CHEST 1 VIEW COMPARISON:  07/30/2015, 07/29/2015 FINDINGS: Cardiomediastinal silhouette unchanged with cardiomegaly. Atherosclerotic calcification of the aortic arch. Bibasilar opacities. Retrocardiac region is opacified. Obscuration the left  hemidiaphragm. Unchanged position of right IJ approach central venous catheter which appears to terminate in the superior vena cava. Interval extubation. Overlying EKG leads. IMPRESSION: Low lung volumes status postextubation, with basilar opacities, potentially edema, atelectasis, and/ or consolidation. Left pleural effusion not excluded. Unchanged right IJ central venous catheter. Signed, Dulcy Fanny. Earleen Newport, DO [facility] Radiology Electronically Signed   By: Corrie Mckusick D.O.   On: 08/03/2015 14:54   Dg Chest [facility]  1 View  07/30/2015  CLINICAL DATA:  Weakness. EXAM: PORTABLE CHEST 1 VIEW COMPARISON:  07/29/2015. FINDINGS: Endotracheal tube repositioned 2.5 cm above the carina. Right IJ line and stable position. Mediastinum and hilar structures are normal. Stable cardiomegaly . Bibasilar atelectasis and/or infiltrates. Small left pleural effusion. No pneumothorax. IMPRESSION: 1. Interim repositioning of endotracheal tube, its tip is now 2.5 cm above the carina in good anatomic position. Right IJ line stable position. 2. Bibasilar atelectasis and/or infiltrates. Small left pleural effusion. No interim change from prior exam . Electronically Signed   By: [place]   On: 07/30/2015 07:57   Portable Chest Xray  07/29/2015  CLINICAL DATA:  Intubation. EXAM: PORTABLE CHEST 1 VIEW COMPARISON:  09/11/2014 FINDINGS: The endotracheal tube is at the level of the carina, directed toward the right mainstem. There is a right jugular central line with tip in the low SVC. There is no pneumothorax. There is left base consolidation due to atelectasis or infiltrate. Right lung is grossly clear. Pulmonary vasculature is normal. IMPRESSION: 1. Endotracheal tube is at the level of the carina, directed to the right mainstem bronchus origin. Recommend withdrawing the tube 2-3 cm. 2. Right jugular central line.  No pneumothorax. 3. Left base consolidation. Electronically Signed    By: Andreas Newport M.D.   On: 07/29/2015 23:23   Dg Hip Unilat With Pelvis 2-3 Views Left  07/28/2015  CLINICAL DATA:  Low back and left hip pain.  No time course given. EXAM: LUMBAR SPINE - 2-3 VIEW; DG HIP (WITH OR WITHOUT PELVIS) 2-3V LEFT COMPARISON:  CT scan 09/19/2014 FINDINGS: Lumbar spine: Left convex lumbar scoliosis and degenerative lumbar spondylosis. Stable vertebral augmentation changes at L2. There is also a remote compression fracture of L1. No acute bony findings. Stable advanced facet disease. No obvious pars defects. Stable aortic and iliac artery calcifications. The visualized bony pelvis is intact. Pelvis/left hip: Both hips are normally located. Moderate degenerative changes bilaterally. The pubic symphysis and SI joints are intact. No pelvic fractures. Remote intra medullary rod and proximal dynamic hip screw transfixing a remote intertrochanteric fracture. No acute bony findings. The hardware is intact. IMPRESSION: 1. Remote lumbar compression fractures. No acute abnormality. Stable scoliosis and degenerative lumbar spondylosis. 2. Intact left hip hardware.  No acute hip or pelvic fracture. Electronically Signed   By: Marijo Sanes M.D.   On: 07/28/2015 16:25     CBC  Recent Labs Lab 08/01/15 0740 08/02/15 0500 08/03/15 0510 08/04/15 0422 08/05/15 0438  WBC 11.5* 12.8* 16.4* 15.5* 13.9*  HGB 8.2* 8.6* 8.6* 8.4* 8.9*  HCT 24.8* 25.9* 26.6* 25.8* 26.4*  PLT 223 246 303 300 301  MCV 91.8 91.2 92.0 92.7 95.9  MCH 30.2 30.1 29.6 30.3 32.3  MCHC 32.9 33.0 32.1 32.7 33.7  RDW 14.5 14.7* 14.8* 15.2* 15.7*    Chemistries   Recent Labs Lab 08/01/15 2247 08/02/15 1055 08/03/15 0510 08/04/15 0422 08/05/15 0438  NA 138 138 138 138 139  K 2.8* 3.7 3.4* 3.4* 4.4  CL 108 107 106 104 106  CO2 30 25 27 30  33*  GLUCOSE 157* 185* 170* 173* 151*  BUN 32* 33* 37* 40* 40*  CREATININE 1.58* 1.60* 1.67* 1.83* 1.81*  CALCIUM 8.0* 8.5* 8.6* 8.4* 8.5*  MG  --  1.8 1.7 2.1 2.0    ------------------------------------------------------------------------------------------------------------------ estimated creatinine clearance is 24.2 mL/min (by C-G formula based on Cr of 1.81). ------------------------------------------------------------------------------------------------------------------ No results for input(s): HGBA1C in the last 72 hours. ------------------------------------------------------------------------------------------------------------------ No results  for input(s): CHOL, HDL, LDLCALC, TRIG, CHOLHDL, LDLDIRECT in the last 72 hours. ------------------------------------------------------------------------------------------------------------------ No results for input(s): TSH, T4TOTAL, T3FREE, THYROIDAB in the last 72 hours.  Invalid input(s): FREET3 ------------------------------------------------------------------------------------------------------------------ No results for input(s): VITAMINB12, FOLATE, FERRITIN, TIBC, IRON, RETICCTPCT in the last 72 hours.  Coagulation profile No results for input(s): INR, PROTIME in the last 168 hours.  No results for input(s): DDIMER in the last 72 hours.  Cardiac Enzymes  Recent Labs Lab 08/01/15 1611  TROPONINI 0.26*   ------------------------------------------------------------------------------------------------------------------ Invalid input(s): POCBNP    Assessment & Plan  The patient is a 82 year old white female with history of paroxysmal atrial fibrillation, , hypertension, hypothyroidism, myasthenia gravis, obstructive sleep apnea who was evaluated for left leg pain. 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 Status post thrombolysis and open embolectomy on 07/29/2015. Postop patient developed acute respiratory failure and hypotension. She was followed by the intensivist now now we're Following patient in consultation for the vascular surgery  1. Shortness of breath with  acute respiratory failure:  Due to acute on chronic systolic Patient is now comfortable medications have been discontinued  2. Acute on chronic systolic CHF start IV Lasix,  comfort care measures  3. Atrial fibrillation  medications medications have been discontinued   4. Ischemic leg status post embolectomy primary care team and vascular is addressing  5. Anemia due to acute blood loss no further check  6. Elevated troponin due to demand ischemia  7. Hypothyroidism continue Synthroid as taking at home stable  8. Elevated WBC count in setting of patient being on IV stress dose steroids   9. Miscellaneous due to comfort care measures no further DVT prophylaxis  10. Code DO NOT RESUSCITATE  Patient may be able to be discharge to a skilled nursing facility with palliative care/ /hospice As per Dr. Hale Bogus note she will be doing the discharge medications Okay to be discharged from a medical standpoint        Code Status Orders        Start     Ordered   08/03/15 1032  Do not attempt resuscitation (DNR)   Continuous    Question Answer Comment  In the event of cardiac or respiratory ARREST Do not call a "code blue"   In the event of cardiac or respiratory ARREST Do not perform Intubation, CPR, defibrillation or ACLS   In the event of cardiac or respiratory ARREST Use medication by any route, position, wound care, and other measures to relive pain and suffering. May use oxygen, suction and manual treatment of airway obstruction as needed for comfort.      08/03/15 1031    Code Status History    Date Active Date Inactive Code Status Order ID Comments User Context   07/28/2015  5:52 PM 08/03/2015 10:31 AM Full Code [id number]  Katha Cabal, MD ED   09/12/2014  2:52 AM 09/20/2014  7:49 PM DNR [id number]  Juluis Mire, MD Inpatient             DVT Prophylaxis  eliquis  Lab Results  Component Value Date   PLT 301 08/05/2015     Time Spent in minutes    77min time coordination and counseling patient regarding the condition and plan of care.   Dustin Flock M.D on 08/06/2015 at 8:37 AM  Between 7am to 6pm - Pager - [phone]  After 6pm go to [email or web address] - password [email or web address] [facility]   Office  [phone]

## 2015-08-06 NOTE — Progress Notes (Signed)
Per Dr. Bunnie Domino note on 08/05/15 "Can remove the VAC on the left groin and place dry dressing as needed." Clinical Social Worker (CSW) made RN aware. MD will have to put order in for wound vac to be removed prior to discharge to [facility]. Patient's family requested for foley to remain in place at discharge.   Blima Rich, LCSW [phone]

## 2015-08-06 NOTE — Progress Notes (Signed)
New referral for [facility] services at [facility] following discharge received from [place] following a Palliative Medicine consult. [name] is an 82 year old woman admitted to [facility] on 4/2 for evaluation of leg pain, with decreased pedal pulses. She is s/p thrombolysis and open embolectomy on 04/03, post op she developed acute respiratory failure and hypotension. She has also required an amiodarone drip for for atrial fibrillation. Palliative Care was consulted for goals of care, after this meeting, patient and family have decided to focus on her comfort. She is currently on 3 liters of oxygen, amiodarone drip has been discontinued and she has been started on low dose liquid morphine (55m TID) for comfort. [facility] Writer met in the patient's room with patient and her daughter Christy Pimpleto initiate education regarding hospice services, philosophy and team approach to care with good understanding voiced. Patient was intermittently awake and able answer questions, oriented x4. Wound vac in place to left groin, with minimal drainage in the cannister. She is eating approximately 30% of her meals and drinking w/o any swallowing difficulty noted. Foley cathter in place, [place] that this remains in place at time of discharge. Will continue to follow through final disposition. Thank you for the opportunity to be involved in the care of this patient and her family. Christy ShanksRN, BSN, CSt. Mauriceand [phone]

## 2015-08-06 NOTE — Progress Notes (Signed)
A&O. Patient bedbound and comfortable through the night. Slept all night. Turned to prevent bedsores. O2 at 3L. Foley in place.

## 2015-08-06 NOTE — Consult Note (Signed)
Palliative Medicine Inpatient Consult Note   Name: [name] Date: 08/06/2015 MRN: [id number]  DOB: [birth year]  Referring Physician: Katha Cabal, MD  Palliative Care consult requested for this 82 y.o. female for goals of medical therapy in patient with multiple medical problems and embolic occlusions of legs.  She had life-saving interventions and afterwards has told family she does not wish to have any further aggressive care or treatments.  TODAY'S DISCUSSIONS AND DECISIONS:  I have confirmed in talking privately with primary care-giver daughter (and son-in-law) and also in talking with patient (with conversation witnessed by nurse for pt) that [name] wants only comfort care at this time.   She is ready to pass on and go be with [facility]. I have explained to family that we have done many highly aggressive interventions and have managed to 'fix' a lot of her conditions that were life-threatening only a few days ago. Had she made this decision earlier, none of these interventions would have taken place. Family and pt state that she has wanted 'comfort care only' for a very long time, but she is only now talking about it often enough and consistently enough, that family knows that this is truly time to do this.   I have talked with her cardiology team and will update her attending. Pt confirmed she only wants comfort meds. That means she would have Eliquis and Amiodarone DCd. And this means she may go back into a rapid heart rate soon --and this could be uncomfortable. But, family feels that pt does not want any meds to 'keep her here longer'. This request is not coming from depression, but rather from pts faith that leads her to want to go 'a natural way' and also because she has grown tired of having a 'medicalized life'. She says she lost a year just being in the hospital and she does not want to have to keep coming back into the hospital 'all the time'. Pt had lost the ability  to walk a few months ago and her quality of life at baseline is quite poor. She also has pain in her shoulders and hips and does not want therapy.   This seems to me to be ethically appropriate --to stop meds that have been added and to follow her with comfort care only in place.   The question to answer will be this: Will stopping these two fairly critical meds cause her to rapidly decline so much that she might qualify for [place] will she continue to be somewhat stable (as she is right now) --in which case, she would need to be a long term care pt at a SNF [facility]. Family and pt do not want rehab /therapy. She does not drink any Ensure and barely eats but we do need a calorie count done to see if she MIGHT be Hospice Home appropriate. Family does NOT want pt to go back to [facility]' and yet they have questions about her personal hospital bed which is located there still.   Will see how pt is doing in the am and will make her comfort care at this time. Will make the medication adjustments mentioned. I have updated Dr. Lucky Cowboy (on call for Dr. Delana Meyer). Will update Dr Posey Pronto and rest of care team in the am. Will ask Social Work for Hospice referral.   BRIEF HISTORY: Pt had several days of increasing leg pain. This was found to be due to bilateral thromboemboli  in arteries. She underwent lysis and intervention as detailed below and she stabilized. She then stated she did not want any aggressive care. She had been started on Amiodarone for rate control and it worked. Pt now in sinus rhythm.  BUT family is trying to honor patients request for comfort care.  Pt apparently has asked (in her own way) for comfort care focus for years now, but she was only able to clearly tell family in unequivocal terms recently --following very aggressive interventions.  She has the ongoing problems as listed below.  She has not walked in months and she feels she has little quality of life. She is  also looking forward to a spiritual afterlife and does not wish to have any medications that would keep her here longer. She has a very poor appetite but reports on intake vary. She 'had a good lunch' --but there is no quantification of that amount.    IMPRESSION: Ischemic legs (blaiterally) ---she had 6 hours of lytic therapy with intervention on left with excellent results on right and significant improvement on left limb.  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  Not now short of breath on the oxygen (was not on oxygen before).  Not able to ambulate for several months.   SPIRITUAL SUPPORT SYSTEM: Yes.  SOCIAL HISTORY:  reports that she has never smoked. She has never used smokeless tobacco. She reports that she does not drink alcohol or use illicit drugs.  One daughter is most involved but there are three other adult children also involved in pts care.  None have HCPOA status.  Pt is own decision maker but children must be involved also as pt has moments when she is confused.   LEGAL DOCUMENTS:  DNR form  CODE STATUS: DNR  PAST MEDICAL HISTORY: Past Medical History  Diagnosis Date  . Hypertension   . Thyroid disorder   . Lymphoma ([place])    . Hypothyroidism   . Arthritis   . Diverticulosis   . Obstructive sleep apnea   . Myasthenia gravis ([place])   . Hip fracture, left ([place])   . Fracture of right humerus   . Asthma   . PAF (paroxysmal atrial fibrillation) ([place])     a. diagnosed in 2016. Refused anticoagulation  . Chronic systolic CHF (congestive heart failure) ([place])     a. 07/2015: EF 35-40% with hypokinesis of the anterior and apical myocardium  . Ischemic leg     a. 07/2015: s/p thrombolysis and open embolectomy of left iliofemoral system    PAST SURGICAL HISTORY:  Past Surgical History  Procedure Laterality Date  . Nm pet lymphoma    . Hernia repair    . Cholecystectomy    . Back surgery    .  Embolectomy Left 07/29/2015    Procedure: EMBOLECTOMY/ Left Femoral;  Surgeon: Katha Cabal, MD;  Location: [facility];  Service: Vascular;  Laterality: Left;  . Peripheral vascular catheterization Bilateral 07/29/2015    Procedure: Lower Extremity Angiography;  Surgeon: Katha Cabal, MD;  Location: [place] [facility];  Service: Cardiovascular;  Laterality: Bilateral;  . Peripheral vascular catheterization  07/29/2015    Procedure: Lower Extremity Intervention;  Surgeon: Katha Cabal, MD;  Location: [place] [facility];  Service: Cardiovascular;;  . Peripheral vascular catheterization N/A 07/29/2015    Procedure: Lower Extremity Angiography;  Surgeon: Algernon Huxley, MD;  Location: [place] [facility];  Service: Cardiovascular;  Laterality: N/A;  . Peripheral vascular catheterization  07/29/2015    Procedure: Lower Extremity Intervention;  Surgeon: Algernon Huxley, MD;  Location: [place] [facility];  Service: Cardiovascular;;    ALLERGIES:  is allergic to betadine; sulfa antibiotics; and valium.  MEDICATIONS:  Current Facility-Administered Medications  Medication Dose Route Frequency Provider Last Rate Last Dose  . 0.9 %  sodium chloride infusion   Intravenous Continuous Algernon Huxley, MD 10 mL/hr at 08/03/15 1631    .  acetaminophen (TYLENOL) tablet 650 mg  650 mg Oral Q6H PRN Katha Cabal, MD       Or  . acetaminophen (TYLENOL) suppository 650 mg  650 mg Rectal Q6H PRN Katha Cabal, MD      . albuterol (PROVENTIL) (2.5 MG/3ML) 0.083% nebulizer solution 3 mL  3 mL Inhalation Q6H PRN Katha Cabal, MD   3 mL at 08/03/15 0259  . bacitracin ointment 1 application  1 application Topical BID Katha Cabal, MD   1 application at [id number] [phone]  . bisacodyl (DULCOLAX) suppository 10 mg  10 mg Rectal Daily PRN Colleen Can, MD      . DULoxetine (CYMBALTA) DR capsule 60 mg  60 mg Oral Daily Kimberly A Stegmayer, PA-C   60 mg at 08/06/15 0951  . glycopyrrolate (ROBINUL) injection 0.4 mg  0.4 mg Intravenous TID PRN Colleen Can, MD      . morphine CONCENTRATE 10 MG/0.5ML oral solution 5 mg  5 mg Oral Q1H PRN Colleen Can, MD      . morphine CONCENTRATE 10 MG/0.5ML oral solution 5 mg  5 mg Oral 3 times per day Colleen Can, MD   5 mg at 08/06/15 0604  . morphine CONCENTRATE 10 MG/0.5ML oral solution 5 mg  5 mg Oral Q2H PRN Colleen Can, MD      . morphine CONCENTRATE 10 MG/0.5ML oral solution           . ondansetron (ZOFRAN) injection 4 mg  4 mg Intravenous Once PRN Gijsbertus F [place] [facility], MD      . polyethylene glycol (MIRALAX / GLYCOLAX) packet 17 g  17 g Oral Daily PRN Flora Lipps, MD      . prochlorperazine (COMPAZINE) suppository 25 mg  25 mg Rectal Q8H PRN Colleen Can, MD      . QUEtiapine (SEROQUEL) tablet 25 mg  25 mg Oral QHS Flora Lipps, MD   25 mg at 08/05/15 2204  . sodium chloride flush (NS) 0.9 % injection 10-40 mL  10-40 mL Intracatheter PRN Katha Cabal, MD      . sodium chloride flush (NS) 0.9 % injection 3 mL  3 mL Intravenous Q12H Katha Cabal, MD   3 mL at 08/05/15 2205  . sorbitol 70 %  solution 30 mL  30 mL Oral Daily PRN Katha Cabal, MD   30 mL at 07/31/15 1639    Vital Signs: BP 100/56 mmHg  Pulse 68  Temp(Src) 98.4 F  (36.9 C) (Oral)  Resp 16  Ht 5' (1.524 m)  Wt 97.16 kg (214 lb 3.2 oz)  BMI 41.83 kg/m2  SpO2 98% Filed Weights   08/03/15 0455 08/04/15 0500 08/05/15 0458  Weight: 101.3 kg (223 lb 5.2 oz) 97.024 kg (213 lb 14.4 oz) 97.16 kg (214 lb 3.2 oz)    Estimated body mass index is 41.83 kg/(m^2) as calculated from the following:   Height as of this encounter: 5' (1.524 m).   Weight as of this encounter: 97.16 kg (214 lb 3.2 oz).  PERFORMANCE STATUS (ECOG) : 4 - Bedbound  PHYSICAL EXAM: Lying in medical bed Appears reasonably comfortable EOMI OP clear Neck w/o jvd at this time Hrt rrr no m Lungs with decreased BS bases ant-lat Abd soft and NT Ext doughy edema but no LABS: CBC:    Component Value Date/Time   WBC 13.9* 08/05/2015 0438   WBC 9.8 05/17/2014 1834   HGB 8.9* 08/05/2015 0438   HGB 11.6* 05/17/2014 1834   HCT 26.4* 08/05/2015 0438   HCT 36.6 05/17/2014 1834   PLT 301 08/05/2015 0438   PLT 242 05/17/2014 1834   MCV 95.9 08/05/2015 0438   MCV 91 05/17/2014 1834   NEUTROABS 5.2 09/11/2014 2233   NEUTROABS 6.2 05/17/2014 1834   LYMPHSABS 1.6 09/11/2014 2233   LYMPHSABS 2.6 05/17/2014 1834   MONOABS 0.9 09/11/2014 2233   MONOABS 0.8 05/17/2014 1834   EOSABS 0.2 09/11/2014 2233   EOSABS 0.2 05/17/2014 1834   BASOSABS 0.1 09/11/2014 2233   BASOSABS 0.1 05/17/2014 1834   BASOSABS 1 03/22/2014 0530   Comprehensive Metabolic Panel:    Component Value Date/Time   NA 139 08/05/2015 0438   NA 137 05/17/2014 1834   K 4.4 08/05/2015 0438   K 4.4 05/17/2014 1834   CL 106 08/05/2015 0438   CL 100 05/17/2014 1834   CO2 33* 08/05/2015 0438   CO2 30 05/17/2014 1834   BUN 40* 08/05/2015 0438   BUN 15 05/17/2014 1834   CREATININE 1.81* 08/05/2015 0438   CREATININE 1.13 05/17/2014 1834   GLUCOSE 151* 08/05/2015 0438   GLUCOSE 99 05/17/2014 1834   CALCIUM 8.5* 08/05/2015 0438   CALCIUM 8.7 05/17/2014 1834   AST 22 09/11/2014 2233   AST 39* 05/17/2014 1834   ALT 21  09/11/2014 2233   ALT 24 05/17/2014 1834   ALKPHOS 72 09/11/2014 2233   ALKPHOS 82 05/17/2014 1834   BILITOT 0.6 09/11/2014 2233   BILITOT 0.3 05/17/2014 1834   PROT 6.6 09/11/2014 2233   PROT 6.5 05/17/2014 1834   ALBUMIN 2.9* 09/11/2014 2233   ALBUMIN 2.6* 05/17/2014 1834    ECHO on 07/31/15:  - Left ventricle: Systolic function was moderately reduced. The  estimated ejection fraction was in the range of 35% to 40%.  Hypokinesis of the anterior myocardium. Hypokinesis of the apical  myocardium. - Mitral valve: Calcified annulus. Mildly thickened leaflets .  There was mild regurgitation. - Left atrium: The atrium was mildly dilated. - Tricuspid valve: There was moderate regurgitation.   More than 50% of the visit was spent in counseling/coordination of care: Yes  Time Spent: 120 minutes

## 2015-08-07 MED ORDER — MORPHINE SULFATE (CONCENTRATE) 10 MG/0.5ML PO SOLN: 5.0000 mg | Freq: Three times a day (TID) | ORAL | Status: AC

## 2015-08-07 MED ORDER — GLYCOPYRROLATE 0.2 MG/ML IJ SOLN: 0.4000 mg | Freq: Three times a day (TID) | INTRAMUSCULAR | Status: AC | PRN

## 2015-08-07 MED ORDER — MORPHINE SULFATE (CONCENTRATE) 10 MG/0.5ML PO SOLN
ORAL | Status: AC
  Administered 2015-08-07: 20 mg
  Filled 2015-08-07: qty 0.5

## 2015-08-07 MED ORDER — MORPHINE SULFATE (CONCENTRATE) 10 MG/0.5ML PO SOLN: 5.0000 mg | Freq: Three times a day (TID) | ORAL | Status: DC

## 2015-08-07 MED ORDER — PROCHLORPERAZINE 25 MG RE SUPP: 25.0000 mg | Freq: Three times a day (TID) | RECTAL | Status: AC | PRN

## 2015-08-07 MED ORDER — MORPHINE SULFATE (CONCENTRATE) 10 MG/0.5ML PO SOLN: 5.0000 mg | ORAL | Status: DC | PRN

## 2015-08-07 MED ORDER — BISACODYL 10 MG RE SUPP: 10.0000 mg | Freq: Every day | RECTAL | Status: AC | PRN

## 2015-08-07 MED ORDER — ALBUTEROL SULFATE HFA 108 (90 BASE) MCG/ACT IN AERS: 2.0000 | INHALATION_SPRAY | RESPIRATORY_TRACT | Status: AC | PRN

## 2015-08-07 MED ORDER — PROCHLORPERAZINE 25 MG RE SUPP: 25.0000 mg | Freq: Three times a day (TID) | RECTAL | Status: DC | PRN

## 2015-08-07 MED ORDER — MORPHINE SULFATE (CONCENTRATE) 10 MG/0.5ML PO SOLN: 5.0000 mg | ORAL | Status: AC | PRN

## 2015-08-07 MED ORDER — LORAZEPAM 0.5 MG PO TABS: 0.5000 mg | ORAL_TABLET | Freq: Three times a day (TID) | ORAL | Status: AC

## 2015-08-07 NOTE — Progress Notes (Signed)
[place] at [facility]                                                                                                                                                                                            Patient Demographics   [name], is a 82 y.o. female, DOB - 1929/06/19, OE:[id number]  Admit date - 07/28/2015   Admitting Physician Katha Cabal, MD  Outpatient Primary MD for the patient is Sarajane Jews, MD   LOS - 10  Subjective:  patient is comfortable no complatins  Review of Systems:   CONSTITUTIONAL: No documented fever. No fatigue, weakness. No weight gain, no weight loss.  EYES: No blurry or double vision.  ENT: No tinnitus. No postnasal drip. No redness of the oropharynx.  RESPIRATORY: No cough, no wheeze, no hemoptysis. Positive dyspnea.  CARDIOVASCULAR: No chest pain. No orthopnea. No palpitations. No syncope.  GASTROINTESTINAL: No nausea, no vomiting or diarrhea. No abdominal pain. No melena or hematochezia.  GENITOURINARY: No dysuria or hematuria.  ENDOCRINE: No polyuria or nocturia. No heat or cold intolerance.  HEMATOLOGY: No anemia. No bruising. No bleeding.  INTEGUMENTARY: No rashes. No lesions. Swelling of the upper extremity MUSCULOSKELETAL: No arthritis. No swelling. No gout.  NEUROLOGIC: No numbness, tingling, or ataxia. No seizure-type activity.  PSYCHIATRIC: No anxiety. No insomnia. No ADD.    Vitals:   Filed Vitals:   08/05/15 1153 08/05/15 1520 08/05/15 1930 08/06/15 0459  BP: 120/65  129/59 100/56  Pulse: 80  90 68  Temp: 97.8 F (36.6 C)  98.2 F (36.8 C) 98.4 F (36.9 C)  TempSrc: Oral  Oral   Resp: 20  16 16   Height:      Weight:      SpO2: 100% 100% 100% 98%    Wt Readings from Last 3 Encounters:  08/05/15 97.16 kg (214 lb 3.2 oz)  09/20/14 90.719 kg (200 lb)  03/31/13 99.338 kg (219 lb)     Intake/Output Summary (Last 24 hours) at 08/07/15 0823 Last data filed at 08/07/15 0630   Gross per 24 hour  Intake      0 ml  Output    900 ml  Net   -900 ml    Physical Exam:   GENERAL: Pleasant-appearing in no apparent distress.  HEAD, EYES, EARS, NOSE AND THROAT: Atraumatic, normocephalic. Extraocular muscles are intact. Pupils equal and reactive to light. Sclerae anicteric. No conjunctival injection. No oro-pharyngeal erythema.  NECK: Supple. There is no jugular venous distention. No bruits, no lymphadenopathy, no thyromegaly.  HEART: Regular rate and rhythm,. No murmurs, no rubs, no clicks.  LUNGS: Clear to auscultation bilaterally. No rales or rhonchi. No wheezes.  ABDOMEN: Soft, flat, nontender, nondistended. Has good bowel sounds. No hepatosplenomegaly appreciated.  EXTREMITIES: No evidence of any cyanosis, clubbing, 1+ edema of the lower extremity And upper extremity NEUROLOGIC: The patient is alert, awake, and oriented x3 with no focal motor or sensory deficits appreciated bilaterally.  SKIN: Moist and warm with no rashes appreciated.  Psych: Not anxious, depressed LN: No inguinal LN enlargement    Antibiotics   Anti-infectives    Start     Dose/Rate Route Frequency Ordered Stop   07/30/15 0600  piperacillin-tazobactam (ZOSYN) IVPB 3.375 g  Status:  Discontinued     3.375 g 12.5 mL/hr over 240 Minutes Intravenous 3 times per day 07/30/15 0403 08/05/15 1822   07/29/15 2230  cefUROXime (ZINACEF) 1.5 g in dextrose 5 % 50 mL IVPB     1.5 g 100 mL/hr over 30 Minutes Intravenous Every 12 hours 07/29/15 2218 07/30/15 1121      Medications   Scheduled Meds: . bacitracin  1 application Topical BID  . DULoxetine  60 mg Oral Daily  . morphine CONCENTRATE  5 mg Oral 3 times per day  . QUEtiapine  25 mg Oral QHS  . sodium chloride flush  3 mL Intravenous Q12H   Continuous Infusions: . sodium chloride 10 mL/hr at 08/03/15 1631   PRN Meds:.acetaminophen **OR** acetaminophen, albuterol, bisacodyl, glycopyrrolate, morphine CONCENTRATE, morphine CONCENTRATE,  ondansetron (ZOFRAN) IV, polyethylene glycol, prochlorperazine, sodium chloride flush, sorbitol   Data Review:   Micro Results Recent Results (from the past 240 hour(s))  MRSA PCR Screening     Status: Abnormal   Collection Time: 07/28/15 10:00 PM  Result Value Ref Range Status   MRSA by PCR POSITIVE (A) NEGATIVE Final    Comment:        The GeneXpert MRSA Assay (FDA approved for NASAL specimens only), is one component of a comprehensive MRSA colonization surveillance program. It is not intended to diagnose MRSA infection nor to guide or monitor treatment for MRSA infections. CRITICAL RESULT CALLED TO, READ BACK BY AND VERIFIED WITH: CALLED TO LISA ROMERO @ [id number] ON 07/28/2015 BY CAF     Radiology Reports Dg Lumbar Spine 2-3 Views  07/28/2015  CLINICAL DATA:  Low back and left hip pain.  No time course given. EXAM: LUMBAR SPINE - 2-3 VIEW; DG HIP (WITH OR WITHOUT PELVIS) 2-3V LEFT COMPARISON:  CT scan 09/19/2014 FINDINGS: Lumbar spine: Left convex lumbar scoliosis and degenerative lumbar spondylosis. Stable vertebral augmentation changes at L2. There is also a remote compression fracture of L1. No acute bony findings. Stable advanced facet disease. No obvious pars defects. Stable aortic and iliac artery calcifications. The visualized bony pelvis is intact. Pelvis/left hip: Both hips are normally located. Moderate degenerative changes bilaterally. The pubic symphysis and SI joints are intact. No pelvic fractures. Remote intra medullary rod and proximal dynamic hip screw transfixing a remote intertrochanteric fracture. No acute bony findings. The hardware is intact. IMPRESSION: 1. Remote lumbar compression fractures. No acute abnormality. Stable scoliosis and degenerative lumbar spondylosis. 2. Intact left hip hardware.  No acute hip or pelvic fracture. Electronically Signed   By: Marijo Sanes M.D.   On: 07/28/2015 16:25   Ct Head Wo Contrast  08/01/2015  CLINICAL DATA:  Recent mental status  changes.  Confusion. EXAM: CT HEAD WITHOUT CONTRAST TECHNIQUE: Contiguous axial images were obtained from the base of the skull through the vertex without intravenous contrast. COMPARISON:  09/12/2014.  09/11/2014. FINDINGS: There is generalized brain atrophy. There chronic small-vessel ischemic changes affecting the deep white matter. No sign of acute infarction. No mass lesion, hemorrhage or extra-axial collection. Chronic ventriculomegaly is felt secondary to central atrophy. This appears unchanged. No calvarial abnormality. Sinuses, middle ears and mastoids are clear. IMPRESSION: No acute or reversible finding. Atrophy and chronic small-vessel ischemic changes of the deep white matter. Chronic ventriculomegaly felt secondary to central atrophy. Electronically Signed   By: Nelson Chimes M.D.   On: 08/01/2015 11:36   US Venous Img Lower Bilateral  07/28/2015  CLINICAL DATA:  Bilateral leg pain EXAM: BILATERAL LOWER EXTREMITY VENOUS DOPPLER ULTRASOUND TECHNIQUE: Gray-scale sonography with graded compression, as well as color Doppler and duplex ultrasound were performed to evaluate the lower extremity deep venous systems from the level of the common femoral vein and including the common femoral, femoral, profunda femoral, popliteal and calf veins including the posterior tibial, peroneal and gastrocnemius veins when visible. The superficial great saphenous vein was also interrogated. Spectral Doppler was utilized to evaluate flow at rest and with distal augmentation maneuvers in the common femoral, femoral and popliteal veins. COMPARISON:  None. FINDINGS: RIGHT LOWER EXTREMITY Common Femoral Vein: No evidence of thrombus. Normal compressibility, respiratory phasicity and response to augmentation. Saphenofemoral Junction: No evidence of thrombus. Normal compressibility and flow on color Doppler imaging. Profunda Femoral Vein: No evidence of thrombus. Normal compressibility and flow on color Doppler imaging. Femoral  Vein: No evidence of thrombus. Normal compressibility, respiratory phasicity and response to augmentation. Popliteal Vein: No evidence of thrombus. Normal compressibility, respiratory phasicity and response to augmentation. Calf Veins: No evidence of thrombus. Normal compressibility and flow on color Doppler imaging. Superficial Great Saphenous Vein: No evidence of thrombus. Normal compressibility and flow on color Doppler imaging. Venous Reflux:  None. Other Findings:  None. LEFT LOWER EXTREMITY Common Femoral Vein: No evidence of thrombus. Normal compressibility, respiratory phasicity and response to augmentation. Saphenofemoral Junction: No evidence of thrombus. Normal compressibility and flow on color Doppler imaging. Profunda Femoral Vein: No evidence of thrombus. Normal compressibility and flow on color Doppler imaging. Femoral Vein: No evidence of thrombus. Normal compressibility, respiratory phasicity and response to augmentation. Popliteal Vein: No evidence of thrombus. Normal compressibility, respiratory phasicity and response to augmentation. Calf Veins: No evidence of thrombus. Normal compressibility and flow on color Doppler imaging. Superficial Great Saphenous Vein: No evidence of thrombus. Normal compressibility and flow on color Doppler imaging. Venous Reflux:  None. Other Findings:  None. IMPRESSION: No evidence of deep venous thrombosis. Electronically Signed   By: Inez Catalina M.D.   On: 07/28/2015 19:11   Dg Chest Port 1 View  08/03/2015  CLINICAL DATA:  82 year old female with a history of not feeling well EXAM: PORTABLE CHEST 1 VIEW COMPARISON:  07/30/2015, 07/29/2015 FINDINGS: Cardiomediastinal silhouette unchanged with cardiomegaly. Atherosclerotic calcification of the aortic arch. Bibasilar opacities. Retrocardiac region is opacified. Obscuration the left hemidiaphragm. Unchanged position of right IJ approach central venous catheter which appears to terminate in the superior vena cava.  Interval extubation. Overlying EKG leads. IMPRESSION: Low lung volumes status postextubation, with basilar opacities, potentially edema, atelectasis, and/ or consolidation. Left pleural effusion not excluded. Unchanged right IJ central venous catheter. Signed, Dulcy Fanny. Earleen Newport, DO [facility] Electronically Signed   By: Corrie Mckusick D.O.   On: 08/03/2015 14:54   Dg Chest Port 1 View  07/30/2015  CLINICAL DATA:  Weakness. EXAM: PORTABLE CHEST 1 VIEW COMPARISON:  07/29/2015. FINDINGS: Endotracheal  tube repositioned 2.5 cm above the carina. Right IJ line and stable position. Mediastinum and hilar structures are normal. Stable cardiomegaly . Bibasilar atelectasis and/or infiltrates. Small left pleural effusion. No pneumothorax. IMPRESSION: 1. Interim repositioning of endotracheal tube, its tip is now 2.5 cm above the carina in good anatomic position. Right IJ line stable position. 2. Bibasilar atelectasis and/or infiltrates. Small left pleural effusion. No interim change from prior exam . Electronically Signed   By: [place]   On: 07/30/2015 07:57   Portable Chest Xray  07/29/2015  CLINICAL DATA:  Intubation. EXAM: PORTABLE CHEST 1 VIEW COMPARISON:  09/11/2014 FINDINGS: The endotracheal tube is at the level of the carina, directed toward the right mainstem. There is a right jugular central line with tip in the low SVC. There is no pneumothorax. There is left base consolidation due to atelectasis or infiltrate. Right lung is grossly clear. Pulmonary vasculature is normal. IMPRESSION: 1. Endotracheal tube is at the level of the carina, directed to the right mainstem bronchus origin. Recommend withdrawing the tube 2-3 cm. 2. Right jugular central line.  No pneumothorax. 3. Left base consolidation. Electronically Signed   By: Andreas Newport M.D.   On: 07/29/2015 23:23   Dg Hip Unilat With Pelvis 2-3 Views Left  07/28/2015  CLINICAL DATA:  Low back  and left hip pain.  No time course given. EXAM: LUMBAR SPINE - 2-3 VIEW; DG HIP (WITH OR WITHOUT PELVIS) 2-3V LEFT COMPARISON:  CT scan 09/19/2014 FINDINGS: Lumbar spine: Left convex lumbar scoliosis and degenerative lumbar spondylosis. Stable vertebral augmentation changes at L2. There is also a remote compression fracture of L1. No acute bony findings. Stable advanced facet disease. No obvious pars defects. Stable aortic and iliac artery calcifications. The visualized bony pelvis is intact. Pelvis/left hip: Both hips are normally located. Moderate degenerative changes bilaterally. The pubic symphysis and SI joints are intact. No pelvic fractures. Remote intra medullary rod and proximal dynamic hip screw transfixing a remote intertrochanteric fracture. No acute bony findings. The hardware is intact. IMPRESSION: 1. Remote lumbar compression fractures. No acute abnormality. Stable scoliosis and degenerative lumbar spondylosis. 2. Intact left hip hardware.  No acute hip or pelvic fracture. Electronically Signed   By: Marijo Sanes M.D.   On: 07/28/2015 16:25     CBC  Recent Labs Lab 08/01/15 0740 08/02/15 0500 08/03/15 0510 08/04/15 0422 08/05/15 0438  WBC 11.5* 12.8* 16.4* 15.5* 13.9*  HGB 8.2* 8.6* 8.6* 8.4* 8.9*  HCT 24.8* 25.9* 26.6* 25.8* 26.4*  PLT 223 246 303 300 301  MCV 91.8 91.2 92.0 92.7 95.9  MCH 30.2 30.1 29.6 30.3 32.3  MCHC 32.9 33.0 32.1 32.7 33.7  RDW 14.5 14.7* 14.8* 15.2* 15.7*    Chemistries   Recent Labs Lab 08/01/15 2247 08/02/15 1055 08/03/15 0510 08/04/15 0422 08/05/15 0438  NA 138 138 138 138 139  K 2.8* 3.7 3.4* 3.4* 4.4  CL 108 107 106 104 106  CO2 30 25 27 30  33*  GLUCOSE 157* 185* 170* 173* 151*  BUN 32* 33* 37* 40* 40*  CREATININE 1.58* 1.60* 1.67* 1.83* 1.81*  CALCIUM 8.0* 8.5* 8.6* 8.4* 8.5*  MG  --  1.8 1.7 2.1 2.0   ------------------------------------------------------------------------------------------------------------------ estimated  creatinine clearance is 24.2 mL/min (by C-G formula based on Cr of 1.81). ------------------------------------------------------------------------------------------------------------------ No results for input(s): HGBA1C in the last 72 hours. ------------------------------------------------------------------------------------------------------------------ No results for input(s): CHOL, HDL, LDLCALC, TRIG, CHOLHDL, LDLDIRECT in the last 72 hours. ------------------------------------------------------------------------------------------------------------------ No results for input(s): TSH,  T4TOTAL, T3FREE, THYROIDAB in the last 72 hours.  Invalid input(s): FREET3 ------------------------------------------------------------------------------------------------------------------ No results for input(s): VITAMINB12, FOLATE, FERRITIN, TIBC, IRON, RETICCTPCT in the last 72 hours.  Coagulation profile No results for input(s): INR, PROTIME in the last 168 hours.  No results for input(s): DDIMER in the last 72 hours.  Cardiac Enzymes  Recent Labs Lab 08/01/15 1611  TROPONINI 0.26*   ------------------------------------------------------------------------------------------------------------------ Invalid input(s): POCBNP    Assessment & Plan  The patient is a 82 year old white female with history of paroxysmal atrial fibrillation, , hypertension, hypothyroidism, myasthenia gravis, obstructive sleep apnea who was evaluated for left leg pain. 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 Status post thrombolysis and open embolectomy on 07/29/2015. Postop patient developed acute respiratory failure and hypotension. She was followed by the intensivist now now we're Following patient in consultation for the vascular surgery  1. Shortness of breath with acute respiratory failure:  Comfort care with supportive therapy  2. Acute on chronic systolic CHF start IV Lasix,  comfort care  measures Heart rate is stable 3. Atrial fibrillation  medications medications have been discontinued   4. Ischemic leg status post embolectomy primary care team and vascular is addressing  5. Anemia due to acute blood loss no further check   6. Elevated troponin due to demand ischemia  7. Hypothyroidism continue Synthroid as taking at home stable  8. Elevated WBC count in setting of patient being on IV stress dose steroids   9. Miscellaneous due to comfort care measures no further DVT prophylaxis  10. Code DO NOT RESUSCITATE  Discharge today      Code Status Orders        Start     Ordered   08/03/15 1032  Do not attempt resuscitation (DNR)   Continuous    Question Answer Comment  In the event of cardiac or respiratory ARREST Do not call a "code blue"   In the event of cardiac or respiratory ARREST Do not perform Intubation, CPR, defibrillation or ACLS   In the event of cardiac or respiratory ARREST Use medication by any route, position, wound care, and other measures to relive pain and suffering. May use oxygen, suction and manual treatment of airway obstruction as needed for comfort.      08/03/15 1031    Code Status History    Date Active Date Inactive Code Status Order ID Comments User Context   07/28/2015  5:52 PM 08/03/2015 10:31 AM Full Code [id number]  Katha Cabal, MD ED   09/12/2014  2:52 AM 09/20/2014  7:49 PM DNR [id number]  Juluis Mire, MD Inpatient             DVT Prophylaxis  eliquis  Lab Results  Component Value Date   PLT 301 08/05/2015     Time Spent in minutes   65min time coordination and counseling patient regarding the condition and plan of care.   Dustin Flock M.D on 08/07/2015 at 8:23 AM  Between 7am to 6pm - Pager - [phone]  After 6pm go to [email or web address] - password [email or web address] [facility]   Office  [phone]

## 2015-08-07 NOTE — Progress Notes (Signed)
Follow up visit made to new referral for Hospice services at [facility]. Patient has continued to decline, with very little oral intake, sleeping more. After discussion with Dr. Megan Salon, staff RN Junious Dresser and family the decision was made for Ms. [name] to be transferred to the [facility] for end of life care. Writer met with patient's daughter's Almyra Free and Sharee Pimple, consents signed, hospital care team all made aware and are in agreement with plan for patient to discharge today via EMS to the hospice home with portable DNR in place. Report called to the hospice home, updated notes faxed to referral, EMS notified for transport. Thank you for the opportunity to be involved in the care of this patient.  Flo Shanks RN, BSN, [facility] [place], [facility] [phone] c

## 2015-08-07 NOTE — Care Management (Signed)
Patient is to transfer to the [facility] today

## 2015-08-07 NOTE — Progress Notes (Signed)
Palliative Care Update  Yesterday, I had communicated that pt would be going EITHER to [facility] for [place] possibly to [facility].  Today, she is worse. Less responsive.  Not eating.    Sometimes it takes TIME to determine the best disposition and the extra day that the patient was here, has shown [place] that she is now more suited to [place] (meeting criteria --NOW).  I will ask for the DC summary to be refreshed with a corrected list of DC meds as the current one lists meds the pt will not be getting.    I will complete the DC med rec list now.  See full note to follow.  Colleen Can, MD

## 2015-08-07 NOTE — Progress Notes (Signed)
Wound vac was d/c and covered with gauze. 3 L of oxygen. No tele. Family was updated on plan to move to hospice home via EMS. Pt has not reported any pain. Poor PO intake. Pt has no further concerns at this time.

## 2015-08-07 NOTE — Discharge Summary (Deleted)
[facility]    Discharge Summary    Patient ID:  [name] MRN: [id number] DOB/AGE: Apr 14, 1930 82 y.o.  Admit date: 07/28/2015 Discharge date: 08/07/2015 Date of Surgery: 07/28/2015 - 07/29/2015 Surgeon: Juliann Mule): Katha Cabal, MD  Admission Diagnosis: Left leg pain [M79.605] Leg pain, bilateral [M79.604, M79.605] Limb ischemia [I99.8]  Discharge Diagnoses:  Left leg pain [M79.605] Leg pain, bilateral [M79.604, M79.605] Limb ischemia [I99.8]  Secondary Diagnoses: Past Medical History  Diagnosis Date  . Hypertension   . Thyroid disorder   . Lymphoma ([place])   . Hypothyroidism   . Arthritis   . Diverticulosis   . Obstructive sleep apnea   . Myasthenia gravis ([place])   . Hip fracture, left ([place])   . Fracture of right humerus   . Asthma   . PAF (paroxysmal atrial fibrillation) ([place])     a. diagnosed in 2016. Refused anticoagulation  . Chronic systolic CHF (congestive heart failure) ([place])     a. 07/2015: EF 35-40% with hypokinesis of the anterior and apical myocardium  . Ischemic leg     a. 07/2015: s/p thrombolysis and open embolectomy of left iliofemoral system    Procedure(s): EMBOLECTOMY/ Left Femoral  Discharged Condition: comfortable  HPI:  Patient presented with several days of ischemic limbs and pain.    Hospital Course:  [name] is a 82 y.o. female is S/P  Procedure(s): Aortoiliac thrombectomy and lysis, iliac stent placement bilaterally EMBOLECTOMY/ Left Femoral Extubated: POD # 1 Physical exam: feet reasonably warm, good capillary refill present Post-op wounds healing well Pt pain controlled with PO pain meds. Labs as below Complications:cardiac arrythmias  After several days of aggressive care and the patient remaining very limited with shortness of breath and cardiac issues, the patient and family have decided to seek hospice services and stop aggressive care.  She is now being discharged today to the  [facility] after Palliative care service evaluated the patient and arranged hospice services.    Consults:  Treatment Team:  Wellington Hampshire, MD Loletha Grayer, MD  Significant Diagnostic Studies: CBC Lab Results  Component Value Date   WBC 13.9* 08/05/2015   HGB 8.9* 08/05/2015   HCT 26.4* 08/05/2015   MCV 95.9 08/05/2015   PLT 301 08/05/2015    BMET    Component Value Date/Time   NA 139 08/05/2015 0438   NA 137 05/17/2014 1834   K 4.4 08/05/2015 0438   K 4.4 05/17/2014 1834   CL 106 08/05/2015 0438   CL 100 05/17/2014 1834   CO2 33* 08/05/2015 0438   CO2 30 05/17/2014 1834   GLUCOSE 151* 08/05/2015 0438   GLUCOSE 99 05/17/2014 1834   BUN 40* 08/05/2015 0438   BUN 15 05/17/2014 1834   CREATININE 1.81* 08/05/2015 0438   CREATININE 1.13 05/17/2014 1834   CALCIUM 8.5* 08/05/2015 0438   CALCIUM 8.7 05/17/2014 1834   GFRNONAA 25* 08/05/2015 0438   GFRNONAA 49* 05/17/2014 1834   GFRNONAA 35* 11/29/2012 1335   GFRNONAA 35* 11/29/2012 1335   GFRAA 28* 08/05/2015 0438   GFRAA 59* 05/17/2014 1834   GFRAA 41* 11/29/2012 1335   GFRAA 40* 11/29/2012 1335   COAG Lab Results  Component Value Date   INR 1.16 07/28/2015   INR 1.00 09/19/2014   INR 1.0 03/14/2014     Disposition:  Discharge to : Hospice Home    Medication List    STOP taking these medications  acetaminophen 325 MG tablet  Commonly known as:  TYLENOL     aspirin 81 MG chewable tablet     atenolol 25 MG tablet  Commonly known as:  TENORMIN     benazepril-hydrochlorthiazide 20-12.5 MG tablet  Commonly known as:  LOTENSIN HCT     docusate sodium 100 MG capsule  Commonly known as:  COLACE     DULoxetine 60 MG capsule  Commonly known as:  CYMBALTA     levothyroxine 75 MCG tablet  Commonly known as:  SYNTHROID, LEVOTHROID     multivitamin with minerals tablet     oxyCODONE 5 MG immediate release tablet  Commonly known as:  Oxy IR/ROXICODONE     polyethylene glycol packet   Commonly known as:  MIRALAX / GLYCOLAX      TAKE these medications        albuterol 108 (90 Base) MCG/ACT inhaler  Commonly known as:  PROVENTIL HFA;VENTOLIN HFA  Inhale 2 puffs into the lungs every 6 (six) hours as needed for shortness of breath.     LUBRICANT EYE DROPS OP  Apply 1 drop to eye every 4 (four) hours as needed (for irritation).     LUBRICATING EYE DROPS 0.5-0.9 % Soln  Generic drug:  Carboxymethylcellul-Glycerin  Apply 1 drop to eye every 12 (twelve) hours as needed (for dry eyes). Wait 3-5 minutes between different eye drops.     morphine CONCENTRATE 10 MG/0.5ML Soln concentrated solution  Take 0.25 mLs (5 mg total) by mouth every hour as needed for moderate pain, severe pain or shortness of breath.     morphine CONCENTRATE 10 MG/0.5ML Soln concentrated solution  Take 0.25 mLs (5 mg total) by mouth every 8 (eight) hours.     morphine CONCENTRATE 10 MG/0.5ML Soln concentrated solution  Take 0.25 mLs (5 mg total) by mouth every 2 (two) hours as needed for moderate pain, severe pain or shortness of breath.     prochlorperazine 25 MG suppository  Commonly known as:  COMPRO  Place 1 suppository (25 mg total) rectally every 8 (eight) hours as needed for nausea or vomiting.     QUEtiapine 50 MG tablet  Commonly known as:  SEROQUEL  Take 50 mg by mouth at bedtime.       Verbal and written Discharge instructions given to the patient. Wound care per Discharge AVS  [facility].    SignedLeotis Pain, MD  08/07/2015, 10:31 AM

## 2015-08-07 NOTE — Discharge Summary (Signed)
[name]    Discharge Summary    Patient ID:  [name] MRN: [id number] DOB/AGE: [birth year] 82 y.o.  Admit date: 07/28/2015 Discharge date: 08/07/2015 Date of Surgery: 07/28/2015 - 07/29/2015 Surgeon: Juliann Mule): Katha Cabal, MD  Admission Diagnosis: Left leg pain [M79.605] Leg pain, bilateral [M79.604, M79.605] Limb ischemia [I99.8]  Discharge Diagnoses:  Left leg pain [M79.605] Leg pain, bilateral [M79.604, M79.605] Limb ischemia [I99.8]  Secondary Diagnoses: Past Medical History  Diagnosis Date  . Hypertension   . Thyroid disorder   . Lymphoma ([place])   . Hypothyroidism   . Arthritis   . Diverticulosis   . Obstructive sleep apnea   . Myasthenia gravis ([place])   . Hip fracture, left ([place])   . Fracture of right humerus   . Asthma   . PAF (paroxysmal atrial fibrillation) ([place])     a. diagnosed in 2016. Refused anticoagulation  . Chronic systolic CHF (congestive heart failure) ([place])     a. 07/2015: EF 35-40% with hypokinesis of the anterior and apical myocardium  . Ischemic leg     a. 07/2015: s/p thrombolysis and open embolectomy of left iliofemoral system    Procedure(s): EMBOLECTOMY/ Left Femoral  Discharged Condition: comfortable  HPI:  Patient presented with ischemic rest pain and aortoiliac and left femoral thrombosis from cardiac embolus  Hospital Course:  [name] is a 82 y.o. female is S/P  Procedure(s): Aortoiliac thrombectomy and lysis, bilateral iliac stent placement EMBOLECTOMY/ Left Femoral Extubated: POD # 1 Physical exam: feet reasonably warm, good capillary refill Post-op wounds healing well  Pt pain controlled with PO pain meds. Labs as below  After procedures revascularization was successful, but she developed arrythmias and cardiopulmonary issues and patient and family no longer desires aggressive care.  Palliative care consulted, and arranged transfer to [place]:   Treatment Team:  Wellington Hampshire, MD Loletha Grayer, MD  Significant Diagnostic Studies: CBC Lab Results  Component Value Date   WBC 13.9* 08/05/2015   HGB 8.9* 08/05/2015   HCT 26.4* 08/05/2015   MCV 95.9 08/05/2015   PLT 301 08/05/2015    BMET    Component Value Date/Time   NA 139 08/05/2015 0438   NA 137 05/17/2014 1834   K 4.4 08/05/2015 0438   K 4.4 05/17/2014 1834   CL 106 08/05/2015 0438   CL 100 05/17/2014 1834   CO2 33* 08/05/2015 0438   CO2 30 05/17/2014 1834   GLUCOSE 151* 08/05/2015 0438   GLUCOSE 99 05/17/2014 1834   BUN 40* 08/05/2015 0438   BUN 15 05/17/2014 1834   CREATININE 1.81* 08/05/2015 0438   CREATININE 1.13 05/17/2014 1834   CALCIUM 8.5* 08/05/2015 0438   CALCIUM 8.7 05/17/2014 1834   GFRNONAA 25* 08/05/2015 0438   GFRNONAA 49* 05/17/2014 1834   GFRNONAA 35* 11/29/2012 1335   GFRNONAA 35* 11/29/2012 1335   GFRAA 28* 08/05/2015 0438   GFRAA 59* 05/17/2014 1834   GFRAA 41* 11/29/2012 1335   GFRAA 40* 11/29/2012 1335   COAG Lab Results  Component Value Date   INR 1.16 07/28/2015   INR 1.00 09/19/2014   INR 1.0 03/14/2014     Disposition:  Discharge to : Hospice home    Medication List    STOP taking these medications        acetaminophen 325 MG tablet  Commonly known as:  TYLENOL     aspirin 81 MG chewable tablet     atenolol 25  MG tablet  Commonly known as:  TENORMIN     benazepril-hydrochlorthiazide 20-12.5 MG tablet  Commonly known as:  LOTENSIN HCT     docusate sodium 100 MG capsule  Commonly known as:  COLACE     DULoxetine 60 MG capsule  Commonly known as:  CYMBALTA     levothyroxine 75 MCG tablet  Commonly known as:  SYNTHROID, LEVOTHROID     LUBRICANT EYE DROPS OP     LUBRICATING EYE DROPS 0.5-0.9 % Soln  Generic drug:  Carboxymethylcellul-Glycerin     multivitamin with minerals tablet     oxyCODONE 5 MG immediate release tablet  Commonly known as:  Oxy IR/ROXICODONE     polyethylene glycol  packet  Commonly known as:  MIRALAX / GLYCOLAX     QUEtiapine 50 MG tablet  Commonly known as:  SEROQUEL      TAKE these medications        albuterol 108 (90 Base) MCG/ACT inhaler  Commonly known as:  PROVENTIL HFA;VENTOLIN HFA  Inhale 2 puffs into the lungs every 4 (four) hours as needed for wheezing or shortness of breath.     bisacodyl 10 MG suppository  Commonly known as:  DULCOLAX  Place 1 suppository (10 mg total) rectally daily as needed for moderate constipation.     glycopyrrolate 0.2 MG/ML injection  Commonly known as:  ROBINUL  Inject 2 mLs (0.4 mg total) into the vein 3 (three) times daily as needed (excessive secretions).     LORazepam 0.5 MG tablet  Commonly known as:  ATIVAN  Take 1 tablet (0.5 mg total) by mouth every 8 (eight) hours.     morphine CONCENTRATE 10 MG/0.5ML Soln concentrated solution  Take 0.25 mLs (5 mg total) by mouth every hour as needed for moderate pain, severe pain or shortness of breath.     morphine CONCENTRATE 10 MG/0.5ML Soln concentrated solution  Take 0.25 mLs (5 mg total) by mouth every 8 (eight) hours.     morphine CONCENTRATE 10 MG/0.5ML Soln concentrated solution  Take 0.25 mLs (5 mg total) by mouth every 2 (two) hours as needed for moderate pain, severe pain or shortness of breath.     prochlorperazine 25 MG suppository  Commonly known as:  COMPRO  Place 1 suppository (25 mg total) rectally every 8 (eight) hours as needed for nausea or vomiting.       Verbal and written Discharge instructions given to the patient. Wound care per Discharge AVS  Patient being discharged today to [facility].  Follow up per Hospice Home services  Signed: Leotis Pain, MD  08/07/2015, 12:36 PM

## 2015-08-07 NOTE — Care Management (Signed)
It is anticipated that patient will discharge to [facility] today with [facility] following.

## 2015-08-07 NOTE — Progress Notes (Signed)
Per Dr. Megan Salon patient will go to [facility] today. Clinical Education officer, museum (CSW) prepared D/C packet including DNR and prescriptions. CSW paged Dr. Lucky Cowboy and ask him to update D/C Summary with Dr. Hale Bogus med rec. CSW met with patient's daughter Evert Kohl at bedside who is in agreement with plan. RN aware of above. [facility] liaison is aware of above. [facility] has also been updated. Please reconsult if future social work needs arise. CSW signing off.   Blima Rich, LCSW [phone]

## 2015-08-07 NOTE — Progress Notes (Signed)
Palliative Medicine Inpatient Consult Follow Up Note   Name: [name] Date: 08/07/2015 MRN: [id number]  DOB: [birth year]  Referring Physician: No att. providers found  Palliative Care consult requested for this 82 y.o. female for goals of medical therapy in patient with ischemic legs and AFib RVR.  TODAY:  Nursing noted mottling on distal legs. This may be due to discontinuation of some of her cardiac meds (which pt no longer wishes to be taking). This is a significant turn for the worse.  We as a team have decided she should go instead to [facility] instead of to [facility] with Hospice.  Additionally, she did not eat today so far and ate small amounts only yesterday.  She is declining and is not expected to live long.  I have done med recs and communicated with the care team.   IMPRESSION: Ischemic legs (blaiterally) ---she had 6 hours of lytic therapy with intervention on left with excellent results on right and significant improvement on left limb.  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      CODE STATUS: DNR   PAST MEDICAL HISTORY: Past Medical History  Diagnosis Date  . Hypertension   . Thyroid disorder   . Lymphoma ([place])   . Hypothyroidism   . Arthritis   . Diverticulosis   . Obstructive sleep apnea   .  Myasthenia gravis ([place])   . Hip fracture, left ([place])   . Fracture of right humerus   . Asthma   . PAF (paroxysmal atrial fibrillation) ([place])     a. diagnosed in 2016. Refused anticoagulation  . Chronic systolic CHF (congestive heart failure) ([place])     a. 07/2015: EF 35-40% with hypokinesis of the anterior and apical myocardium  . Ischemic leg     a. 07/2015: s/p thrombolysis and open embolectomy of left iliofemoral system    PAST SURGICAL HISTORY:  Past Surgical History  Procedure Laterality Date  . Nm pet lymphoma    . Hernia repair    . Cholecystectomy    . Back surgery    . Embolectomy Left 07/29/2015    Procedure: EMBOLECTOMY/ Left Femoral;  Surgeon: Katha Cabal, MD;  Location: [facility] ORS;  Service: Vascular;  Laterality: Left;  . Peripheral vascular catheterization Bilateral 07/29/2015    Procedure: Lower Extremity Angiography;  Surgeon: Katha Cabal, MD;  Location: [facility];  Service: Cardiovascular;  Laterality: Bilateral;  . Peripheral vascular catheterization  07/29/2015    Procedure: Lower Extremity Intervention;  Surgeon: Katha Cabal, MD;  Location: [facility];  Service: Cardiovascular;;  . Peripheral vascular catheterization N/A 07/29/2015    Procedure: Lower Extremity Angiography;  Surgeon: Algernon Huxley, MD;  Location: [facility];  Service: Cardiovascular;  Laterality: N/A;  . Peripheral vascular catheterization  07/29/2015    Procedure: Lower Extremity Intervention;  Surgeon:  Algernon Huxley, MD;  Location: [facility];  Service: Cardiovascular;;    Vital Signs: BP 98/60 mmHg  Pulse 87  Temp(Src) 98.5 F (36.9 C) (Oral)  Resp 19  Ht 5' (1.524 m)  Wt 97.16 kg (214 lb 3.2 oz)  BMI 41.83 kg/m2  SpO2 100% Filed Weights   08/03/15 0455 08/04/15 0500 08/05/15 0458  Weight: 101.3 kg (223 lb 5.2 oz) 97.024 kg (213 lb 14.4 oz) 97.16 kg (214 lb 3.2 oz)    Estimated body mass index is 41.83 kg/(m^2) as calculated from the  following:   Height as of this encounter: 5' (1.524 m).   Weight as of this encounter: 97.16 kg (214 lb 3.2 oz).  PHYSICAL EXAM: Lying in medical bed --very, very lethargic today No JVD seen  Heart irreg irreg Lungs with decreased BS bases Abd soft and NT Ext MOTTLING distal legs and feet noted today (new)  LABS: CBC:    Component Value Date/Time   WBC 13.9* 08/05/2015 0438   WBC 9.8 05/17/2014 1834   HGB 8.9* 08/05/2015 0438   HGB 11.6* 05/17/2014 1834   HCT 26.4* 08/05/2015 0438   HCT 36.6 05/17/2014 1834   PLT 301 08/05/2015 0438   PLT 242 05/17/2014 1834   MCV 95.9 08/05/2015 0438   MCV 91 05/17/2014 1834   NEUTROABS 5.2 09/11/2014 2233   NEUTROABS 6.2 05/17/2014 1834   LYMPHSABS 1.6 09/11/2014 2233   LYMPHSABS 2.6 05/17/2014 1834   MONOABS 0.9 09/11/2014 2233   MONOABS 0.8 05/17/2014 1834   EOSABS 0.2 09/11/2014 2233   EOSABS 0.2 05/17/2014 1834   BASOSABS 0.1 09/11/2014 2233   BASOSABS 0.1 05/17/2014 1834   BASOSABS 1 03/22/2014 0530   Comprehensive Metabolic Panel:    Component Value Date/Time   NA 139 08/05/2015 0438   NA 137 05/17/2014 1834   K 4.4 08/05/2015 0438   K 4.4 05/17/2014 1834   CL 106 08/05/2015 0438   CL 100 05/17/2014 1834   CO2 33* 08/05/2015 0438   CO2 30 05/17/2014 1834   BUN 40* 08/05/2015 0438   BUN 15 05/17/2014 1834   CREATININE 1.81* 08/05/2015 0438   CREATININE 1.13 05/17/2014 1834   GLUCOSE 151* 08/05/2015 0438   GLUCOSE 99 05/17/2014 1834   CALCIUM 8.5* 08/05/2015 0438   CALCIUM 8.7 05/17/2014 1834   AST 22 09/11/2014 2233   AST 39* 05/17/2014 1834   ALT 21 09/11/2014 2233   ALT 24 05/17/2014 1834   ALKPHOS 72 09/11/2014 2233   ALKPHOS 82 05/17/2014 1834   BILITOT 0.6 09/11/2014 2233   BILITOT 0.3 05/17/2014 1834   PROT 6.6 09/11/2014 2233   PROT 6.5 05/17/2014 1834   ALBUMIN 2.0* 08/06/2015 1536   ALBUMIN 2.6* 05/17/2014 1834     More than 50% of the visit was spent in counseling/coordination of care:  YES  Time Spent: 35 min

## 2015-08-26 DEATH — deceased

## 2016-11-28 IMAGING — CT CT ABD-PELV W/O CM
2 of 4 series · 16 of 46 positions shown, 18 images · non-contrast
Comparison: 03/23/2014

CLINICAL DATA: Abdominal distention. Distended cecum on recent KUB.

EXAM:
CT ABDOMEN AND PELVIS WITHOUT CONTRAST
TECHNIQUE: Multidetector CT imaging of the abdomen and pelvis was performed
following the standard protocol without IV contrast.

[Series 2: routine abd pel without · axial · non-contrast · 0.90mm/px · z∈[+122,+542]mm · 13 of 92 slices shown, 15 images]
[im 4/92  soft-tissue]
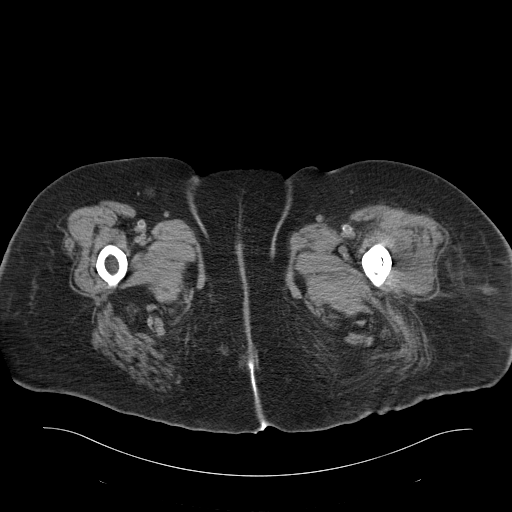
[im 4/92  bone]
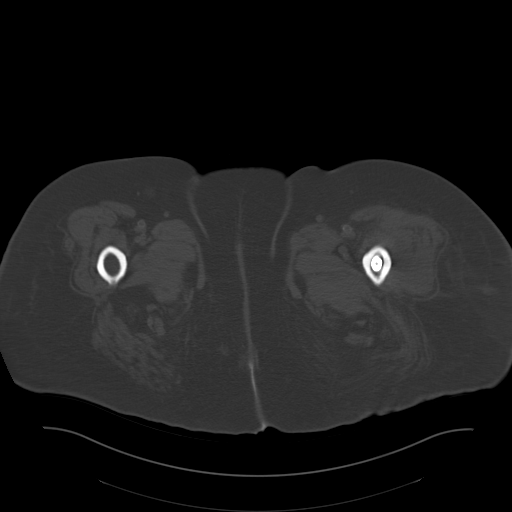
[im 11/92  soft-tissue]
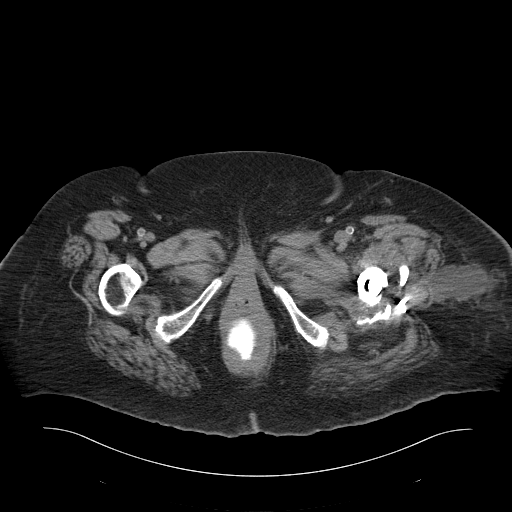
[im 19/92  soft-tissue]
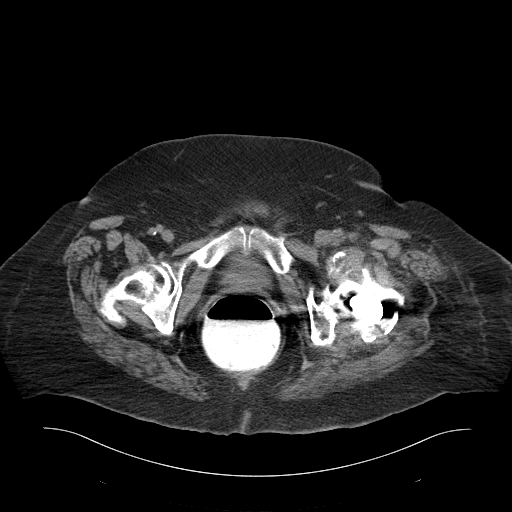
[im 26/92  soft-tissue]
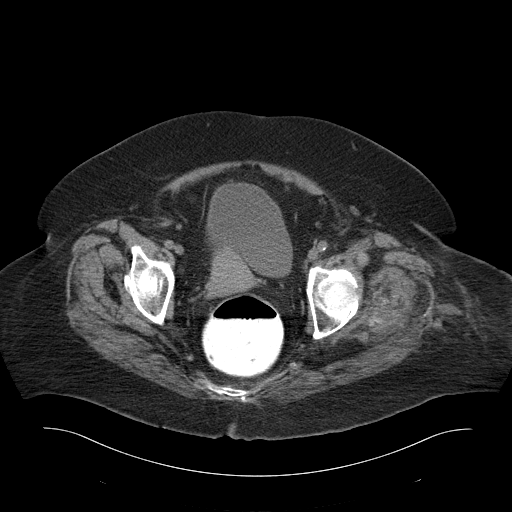
[im 33/92  soft-tissue]
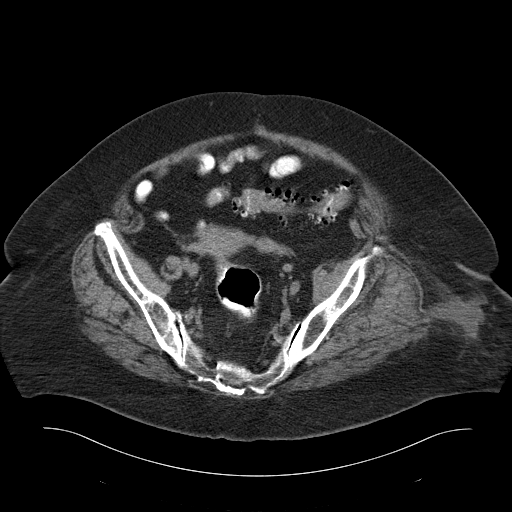
[im 41/92  soft-tissue]
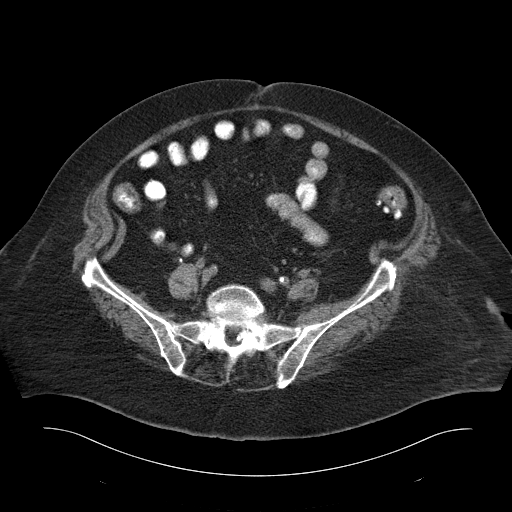
[im 48/92  soft-tissue]
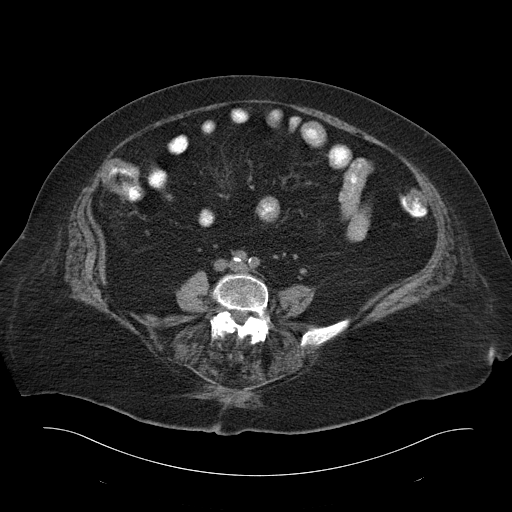
[im 51/92  soft-tissue]
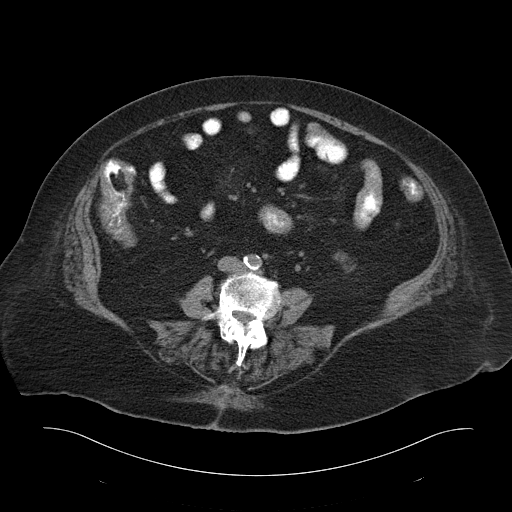
[im 59/92  soft-tissue]
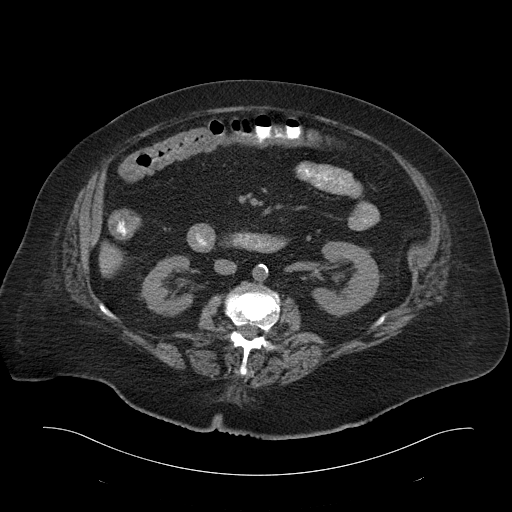
[im 59/92  bone]
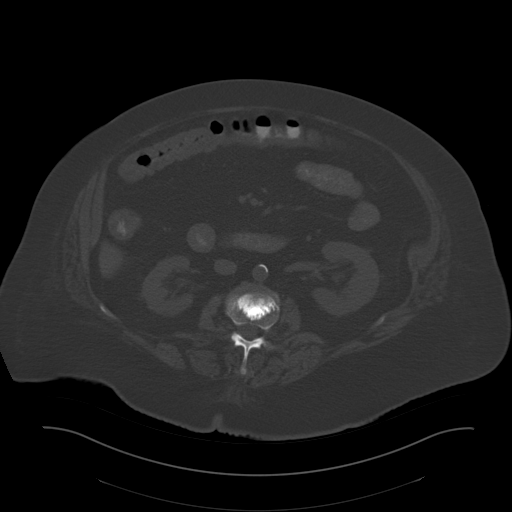
[im 66/92  soft-tissue]
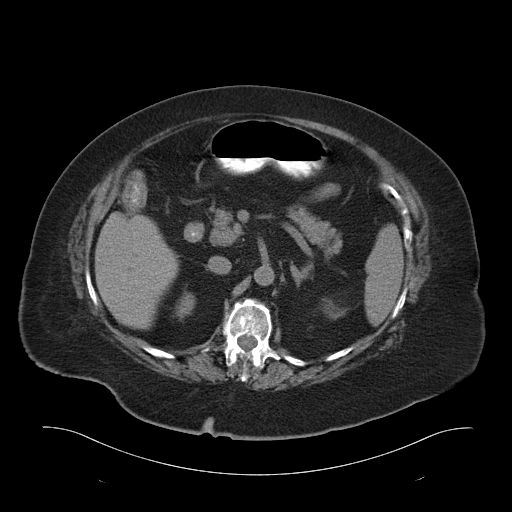
[im 73/92  soft-tissue]
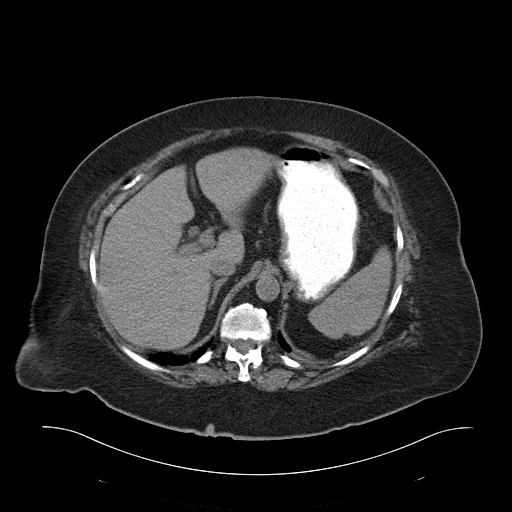
[im 81/92  soft-tissue]
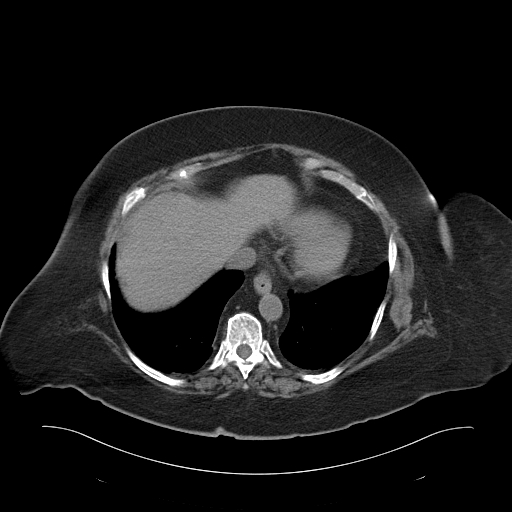
[im 88/92  soft-tissue]
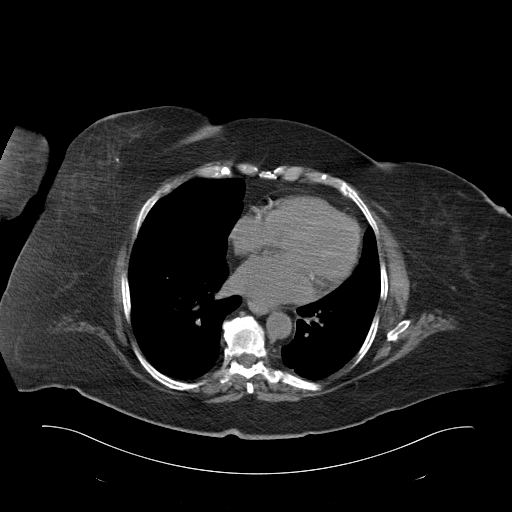

[Series 5: cor routine abd pel wo · coronal · 0.89mm/px · 3 of 165 slices shown]
[im 55/165  soft-tissue]
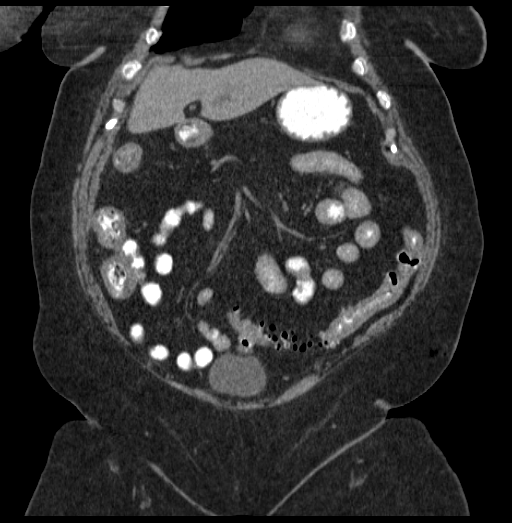
[im 73/165  soft-tissue]
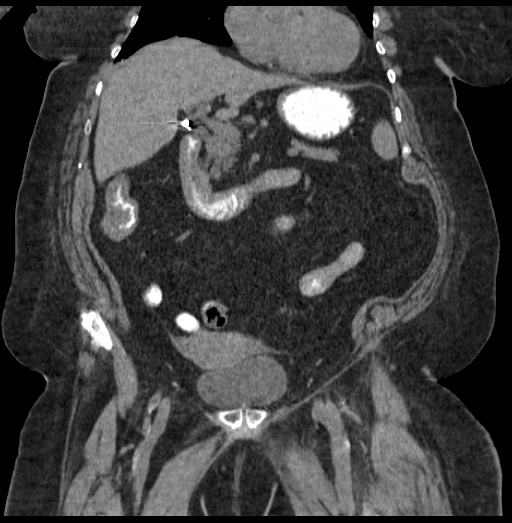
[im 92/165  soft-tissue]
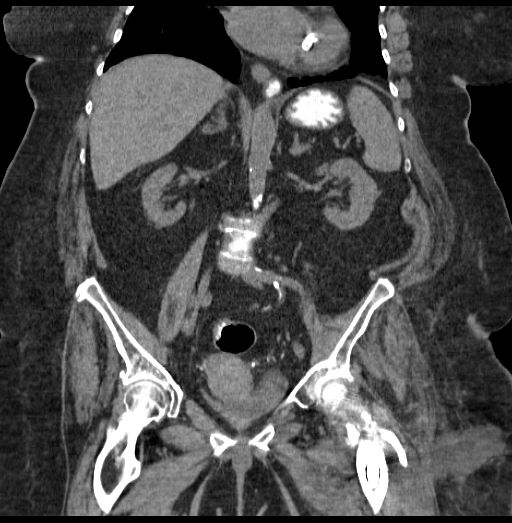

[16 of 46 positions shown; findings below may reference images not displayed]

FINDINGS: Lower chest: 5 mm pulmonary nodule in the lateral left lung base is
stable.

Hepatobiliary: No mass or other abnormality visualized on this
non-contrast exam. Prior cholecystectomy noted. No evidence of
biliary dilatation.

Pancreas: No mass or inflammatory process visualized on this
non-contrast exam.

Spleen:  Within normal limits in size.

Adrenal Glands:  No mass identified.

Kidneys/Urinary tract: No evidence of urolithiasis or
hydronephrosis. Mild right renal atrophy remains stable.

Stomach/Bowel/Peritoneum: Tiny hiatal hernia again noted. No
evidence of dilated bowel loops. Mild diffuse wall thickening is
seen involving the ascending, transverse, and descending portions of
the colon, suspicious for mild colitis. Diverticulosis is seen
involving the sigmoid colon, however there is no evidence of
diverticulitis.

Vascular/Lymphatic: No pathologically enlarged lymph nodes
identified. No other significant abnormality identified.

Reproductive:  No mass or other significant abnormality noted.

Other:  None.

Musculoskeletal: Left hip fracture again noted with surgical
hardware in place.
IMPRESSION: Mild diffuse colonic wall thickening, suspicious for colitis. No
evidence of abscess, free fluid, or bowel obstruction.

Sigmoid diverticulosis, without evidence of acute diverticulitis.

5 mm indeterminate left lower lobe pulmonary nodule remains stable
since recent exam. If the patient is at high risk for bronchogenic
carcinoma, follow-up chest CT at 6-12 months is recommended. If the
patient is at low risk for bronchogenic carcinoma, follow-up chest
CT at 12 months is recommended. This recommendation follows the
consensus statement: Guidelines for Management of Small Pulmonary
Nodules Detected on CT Scans: A Statement from the [facility]
